# Patient Record
Sex: Male | Born: 1955
Health system: Southern US, Community
[De-identification: ages and names within clinical notes are randomized; demographics above are authoritative.]

## PROBLEM LIST (undated history)

## (undated) DIAGNOSIS — T7840XA Allergy, unspecified, initial encounter: Secondary | ICD-10-CM

## (undated) DIAGNOSIS — F17200 Nicotine dependence, unspecified, uncomplicated: Secondary | ICD-10-CM

## (undated) DIAGNOSIS — E782 Mixed hyperlipidemia: Secondary | ICD-10-CM

## (undated) DIAGNOSIS — E119 Type 2 diabetes mellitus without complications: Secondary | ICD-10-CM

## (undated) DIAGNOSIS — N529 Male erectile dysfunction, unspecified: Secondary | ICD-10-CM

## (undated) DIAGNOSIS — I10 Essential (primary) hypertension: Secondary | ICD-10-CM

## (undated) DIAGNOSIS — K5732 Diverticulitis of large intestine without perforation or abscess without bleeding: Secondary | ICD-10-CM

## (undated) HISTORY — DX: Essential (primary) hypertension: I10

## (undated) HISTORY — DX: Diverticulitis of large intestine without perforation or abscess without bleeding: K57.32

## (undated) HISTORY — PX: COLONOSCOPY: SHX174

## (undated) HISTORY — DX: Allergy, unspecified, initial encounter: T78.40XA

## (undated) HISTORY — DX: Male erectile dysfunction, unspecified: N52.9

## (undated) HISTORY — DX: Mixed hyperlipidemia: E78.2

## (undated) HISTORY — DX: Nicotine dependence, unspecified, uncomplicated: F17.200

## (undated) HISTORY — DX: Type 2 diabetes mellitus without complications: E11.9

---

## 2006-02-26 ENCOUNTER — Ambulatory Visit: Payer: Self-pay | Admitting: Family Medicine

## 2006-02-26 ENCOUNTER — Ambulatory Visit: Payer: Self-pay | Admitting: Cardiology

## 2006-02-28 ENCOUNTER — Ambulatory Visit: Payer: Self-pay | Admitting: Family Medicine

## 2006-04-20 ENCOUNTER — Ambulatory Visit: Payer: Self-pay | Admitting: Family Medicine

## 2006-05-18 ENCOUNTER — Ambulatory Visit: Payer: Self-pay | Admitting: Family Medicine

## 2006-05-25 ENCOUNTER — Ambulatory Visit: Payer: Self-pay | Admitting: Family Medicine

## 2006-05-25 ENCOUNTER — Encounter: Admission: RE | Admit: 2006-05-25 | Discharge: 2006-05-25 | Payer: Self-pay | Admitting: Family Medicine

## 2006-06-01 ENCOUNTER — Ambulatory Visit: Payer: Self-pay | Admitting: Family Medicine

## 2006-07-02 ENCOUNTER — Ambulatory Visit: Payer: Self-pay | Admitting: Family Medicine

## 2006-09-03 ENCOUNTER — Ambulatory Visit: Payer: Self-pay | Admitting: Family Medicine

## 2006-09-03 LAB — CONVERTED CEMR LAB
Chol/HDL Ratio, serum: 7.3
HDL: 35 mg/dL — ABNORMAL LOW (ref >39.0)
Hgb A1c MFr Bld: 5.9 % (ref 4.6–6.0)
Triglyceride fasting, serum: 193 mg/dL — ABNORMAL HIGH (ref 0–149)
VLDL: 39 mg/dL (ref 0–40)

## 2006-09-05 ENCOUNTER — Ambulatory Visit: Payer: Self-pay | Admitting: Family Medicine

## 2006-09-10 ENCOUNTER — Ambulatory Visit: Payer: Self-pay | Admitting: Family Medicine

## 2006-09-10 LAB — CONVERTED CEMR LAB
Cholesterol: 265 mg/dL (ref 0–200)
HDL: 35.5 mg/dL — ABNORMAL LOW (ref >39.0)
Hgb A1c MFr Bld: 6 % (ref 4.6–6.0)
LDL DIRECT: 186.1 mg/dL
Triglyceride fasting, serum: 189 mg/dL — ABNORMAL HIGH (ref 0–149)
VLDL: 38 mg/dL (ref 0–40)

## 2006-11-21 ENCOUNTER — Ambulatory Visit: Payer: Self-pay | Admitting: Family Medicine

## 2006-11-21 LAB — CONVERTED CEMR LAB
ALT: 42 U/L — ABNORMAL HIGH
AST: 26 U/L
Albumin: 4.1 g/dL
Alkaline Phosphatase: 58 U/L
Bilirubin, Direct: 0.2 mg/dL
Cholesterol: 174 mg/dL
Direct LDL: 113.4 mg/dL
HDL: 33.5 mg/dL — ABNORMAL LOW
Total Bilirubin: 0.8 mg/dL
Total CHOL/HDL Ratio: 5.2
Total Protein: 6.8 g/dL
Triglycerides: 206 mg/dL
VLDL: 41 mg/dL — ABNORMAL HIGH

## 2007-03-06 IMAGING — CT CT ABDOMEN W/ CM
2 of 5 series · 17 of 46 positions shown, 19 images · IV contrast (APPLIED)
Comparison: None.

CLINICAL DATA: Lower abdominal pain with fever and chills.
 ABDOMEN CT WITH CONTRAST:
TECHNIQUE: Multidetector CT imaging of the abdomen was performed following the standard protocol during bolus administration of intravenous contrast.
 Contrast:  125 cc Omnipaque 300.
TECHNIQUE: Multidetector CT imaging of the pelvis was performed following the standard protocol during bolus administration of intravenous contrast.

[Series 2: abd_pel 5.0 b30f st · axial · 0.68mm/px · z∈[-482,-82]mm · 14 of 90 slices shown, 16 images]
[im 5/90  soft-tissue]
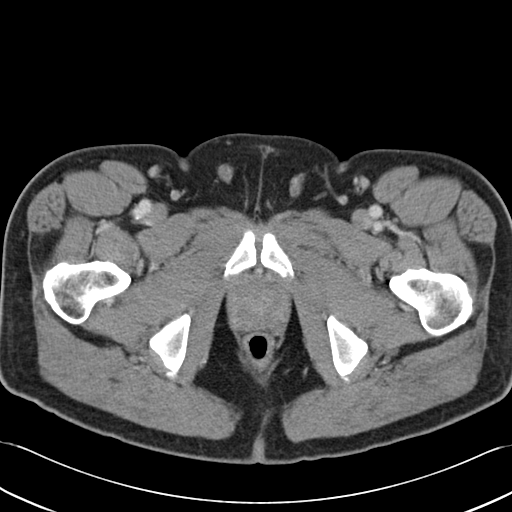
[im 5/90  bone]
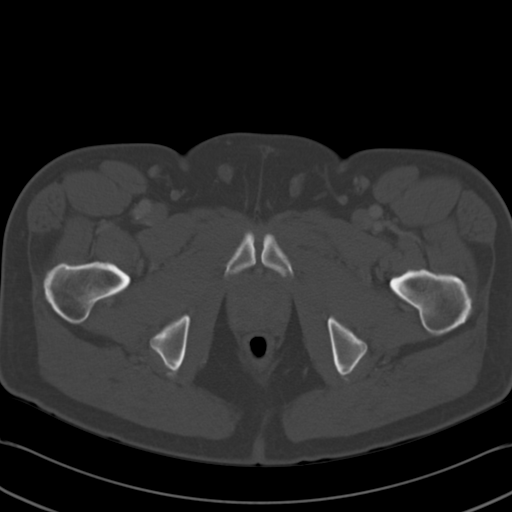
[im 14/90  soft-tissue]
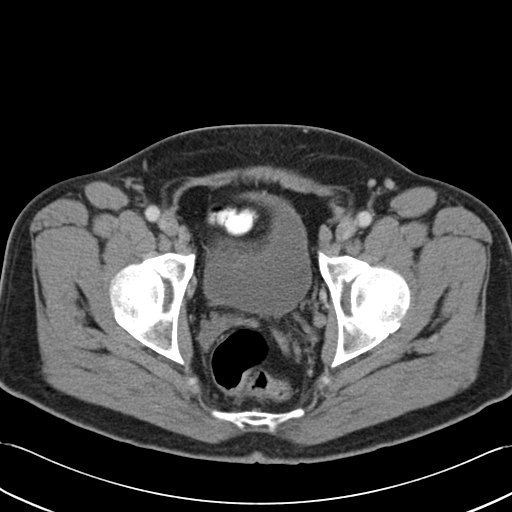
[im 18/90  soft-tissue]
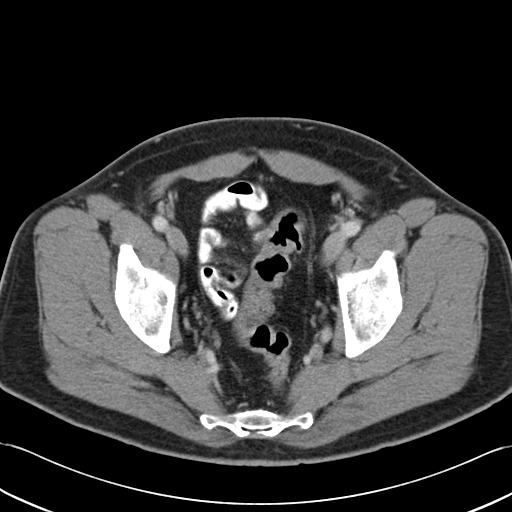
[im 23/90  soft-tissue]
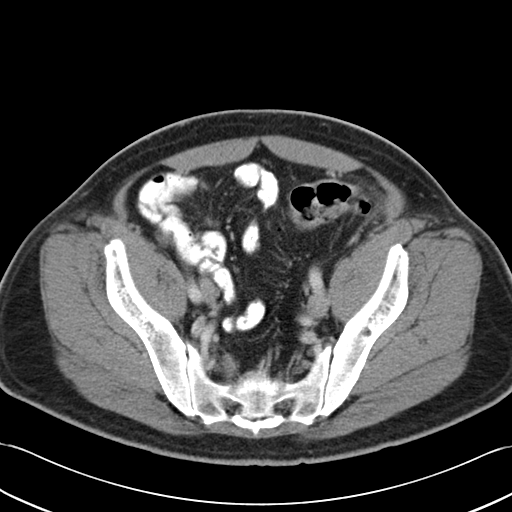
[im 32/90  soft-tissue]
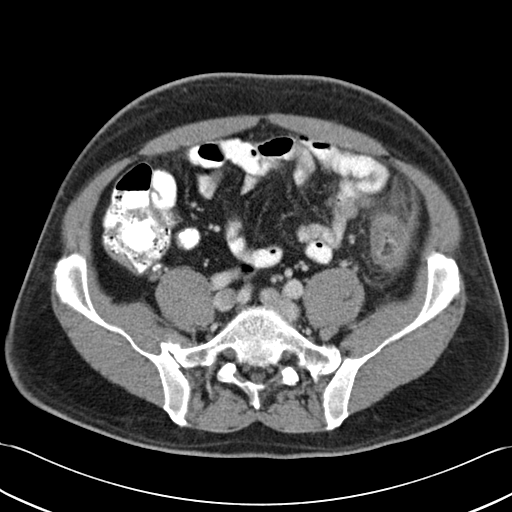
[im 36/90  soft-tissue]
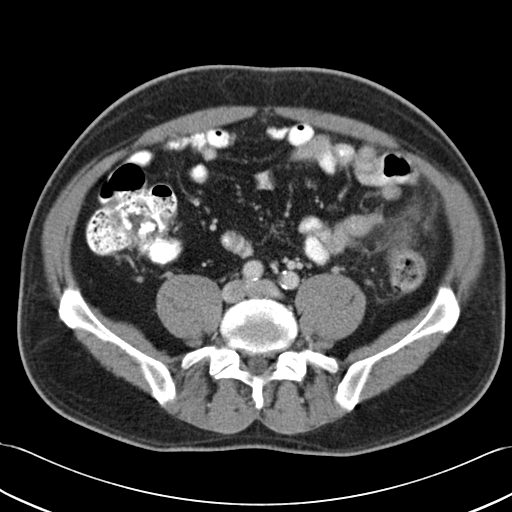
[im 41/90  soft-tissue]
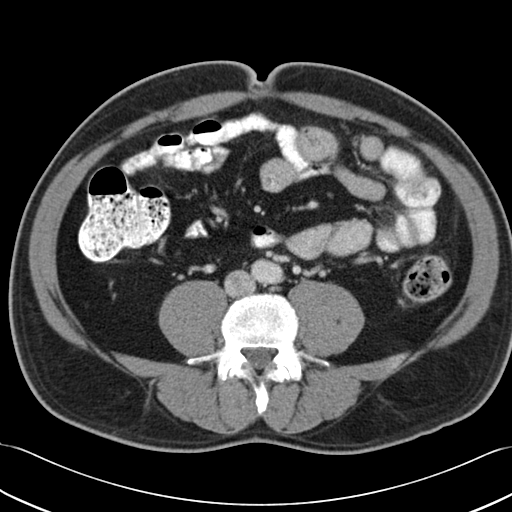
[im 49/90  soft-tissue]
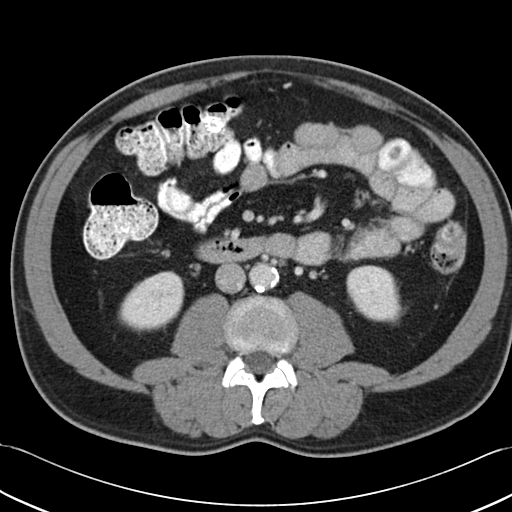
[im 54/90  soft-tissue]
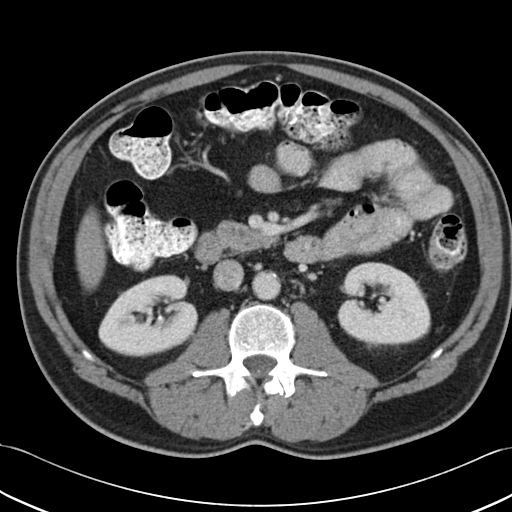
[im 54/90  bone]
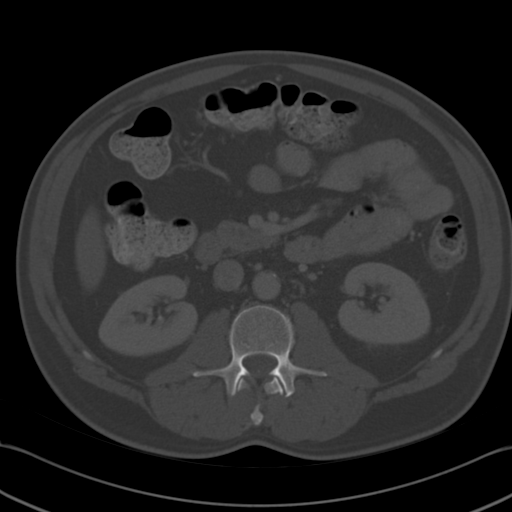
[im 58/90  soft-tissue]
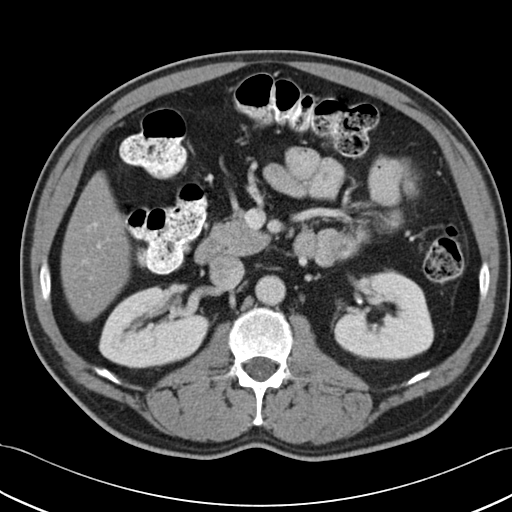
[im 67/90  soft-tissue]
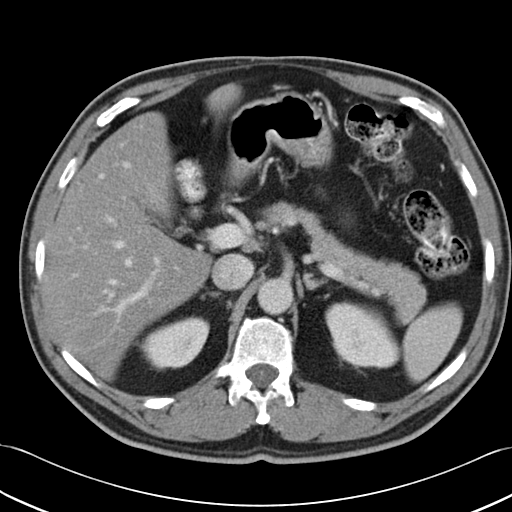
[im 72/90  soft-tissue]
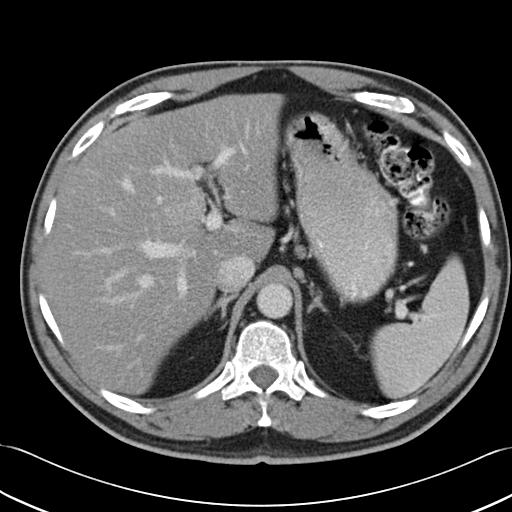
[im 76/90  soft-tissue]
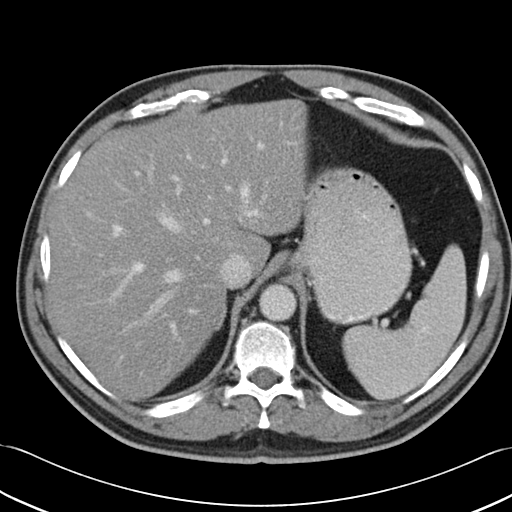
[im 85/90  soft-tissue]
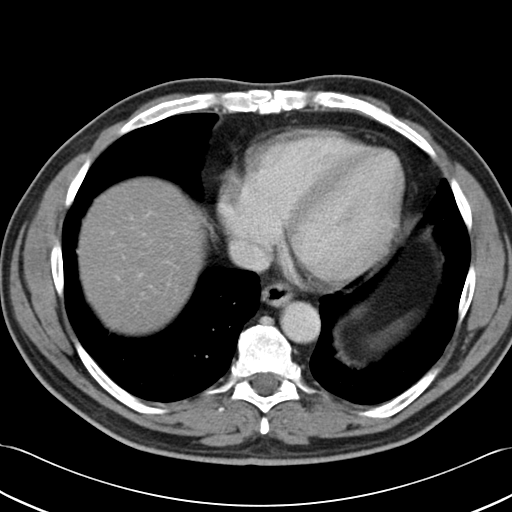

[Series 602: coronal abdomen · coronal · 0.90mm/px · 3 of 53 slices shown]
[im 18/53  soft-tissue]
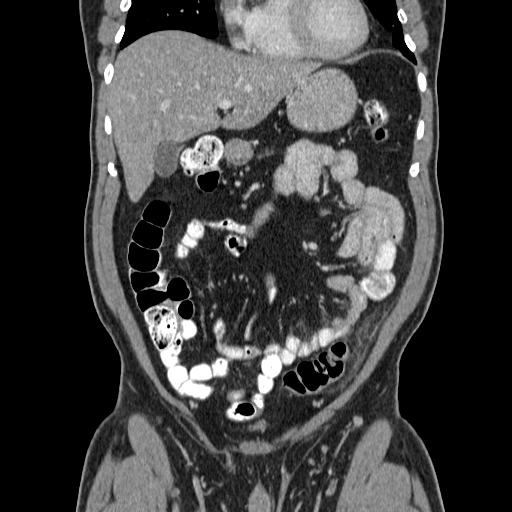
[im 24/53  soft-tissue]
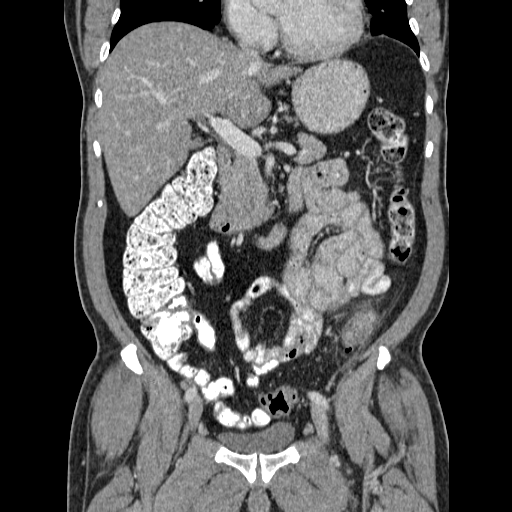
[im 29/53  soft-tissue]
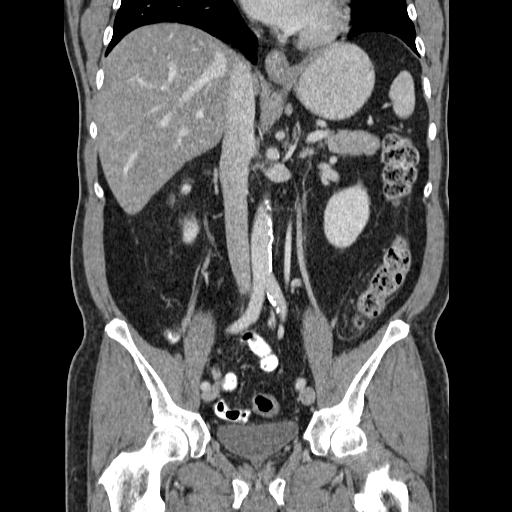

[17 of 46 positions shown; findings below may reference images not displayed]

FINDINGS: Lung bases are clear.  There is diffuse low attenuation of the liver consistent with fatty infiltration.  No focal defects.  Spleen, pancreas, and adrenals normal.  Early and delayed images of the kidneys normal.
 No calcified gallstones or bile duct dilatation.
IMPRESSION: Fatty infiltration of the liver ? no acute abnormality.
 PELVIS CT WITH CONTRAST:
FINDINGS: There is stranding around the descending colon with wall thickening.  There are several diverticular outpouchings and so the most likely diagnosis is diverticulitis.  No definite perforation, abscess, or free air.
IMPRESSION: Findings consistent with lower descending colon diverticulitis ? no abscess or perforation at this time.

## 2007-04-08 ENCOUNTER — Ambulatory Visit: Payer: Self-pay | Admitting: Family Medicine

## 2007-05-17 DIAGNOSIS — E1159 Type 2 diabetes mellitus with other circulatory complications: Secondary | ICD-10-CM | POA: Insufficient documentation

## 2007-05-17 DIAGNOSIS — I10 Essential (primary) hypertension: Secondary | ICD-10-CM | POA: Insufficient documentation

## 2007-05-17 DIAGNOSIS — E119 Type 2 diabetes mellitus without complications: Secondary | ICD-10-CM

## 2007-05-17 HISTORY — DX: Essential (primary) hypertension: I10

## 2007-05-17 HISTORY — DX: Type 2 diabetes mellitus without complications: E11.9

## 2007-05-20 ENCOUNTER — Ambulatory Visit: Payer: Self-pay | Admitting: Family Medicine

## 2007-05-20 LAB — CONVERTED CEMR LAB
ALT: 41 units/L (ref 0–53)
AST: 25 units/L (ref 0–37)
Alkaline Phosphatase: 47 units/L (ref 39–117)
Blood in Urine, dipstick: NEGATIVE
Calcium: 9.3 mg/dL (ref 8.4–10.5)
Cholesterol: 205 mg/dL (ref 0–200)
Eosinophils Absolute: 0.2 10*3/uL (ref 0.0–0.6)
GFR calc non Af Amer: 127 mL/min
Glucose, Urine, Semiquant: NEGATIVE
HDL: 32.5 mg/dL — ABNORMAL LOW (ref >39.0)
MCV: 92.8 fL (ref 78.0–100.0)
Monocytes Absolute: 0.7 10*3/uL (ref 0.2–0.7)
Monocytes Relative: 7.8 % (ref 3.0–11.0)
Neutrophils Relative %: 59.6 % (ref 43.0–77.0)
Nitrite: NEGATIVE
RBC: 4.9 M/uL (ref 4.22–5.81)
TSH: 2.91 microintl units/mL (ref 0.35–5.50)
Total Bilirubin: 0.9 mg/dL (ref 0.3–1.2)
Total CHOL/HDL Ratio: 6.3
Total Protein: 6.9 g/dL (ref 6.0–8.3)
Urobilinogen, UA: 0.2
WBC Urine, dipstick: NEGATIVE
WBC: 8.8 10*3/uL (ref 4.5–10.5)
pH: 5.5

## 2007-05-27 ENCOUNTER — Ambulatory Visit: Payer: Self-pay | Admitting: Family Medicine

## 2007-05-27 DIAGNOSIS — F17200 Nicotine dependence, unspecified, uncomplicated: Secondary | ICD-10-CM | POA: Insufficient documentation

## 2007-05-27 DIAGNOSIS — N529 Male erectile dysfunction, unspecified: Secondary | ICD-10-CM

## 2007-05-27 DIAGNOSIS — E1169 Type 2 diabetes mellitus with other specified complication: Secondary | ICD-10-CM | POA: Insufficient documentation

## 2007-05-27 DIAGNOSIS — E782 Mixed hyperlipidemia: Secondary | ICD-10-CM | POA: Insufficient documentation

## 2007-05-27 DIAGNOSIS — Z720 Tobacco use: Secondary | ICD-10-CM | POA: Insufficient documentation

## 2007-05-27 HISTORY — DX: Mixed hyperlipidemia: E78.2

## 2007-05-27 HISTORY — DX: Nicotine dependence, unspecified, uncomplicated: F17.200

## 2007-05-27 HISTORY — DX: Male erectile dysfunction, unspecified: N52.9

## 2007-06-02 IMAGING — CR DG CHEST 2V
2 series · 2 of 2 positions shown · non-contrast
Comparison: None.

CLINICAL DATA: Cough.  Smoking history.
 CHEST - 2 VIEW:

[view not recorded (1 of 2)]
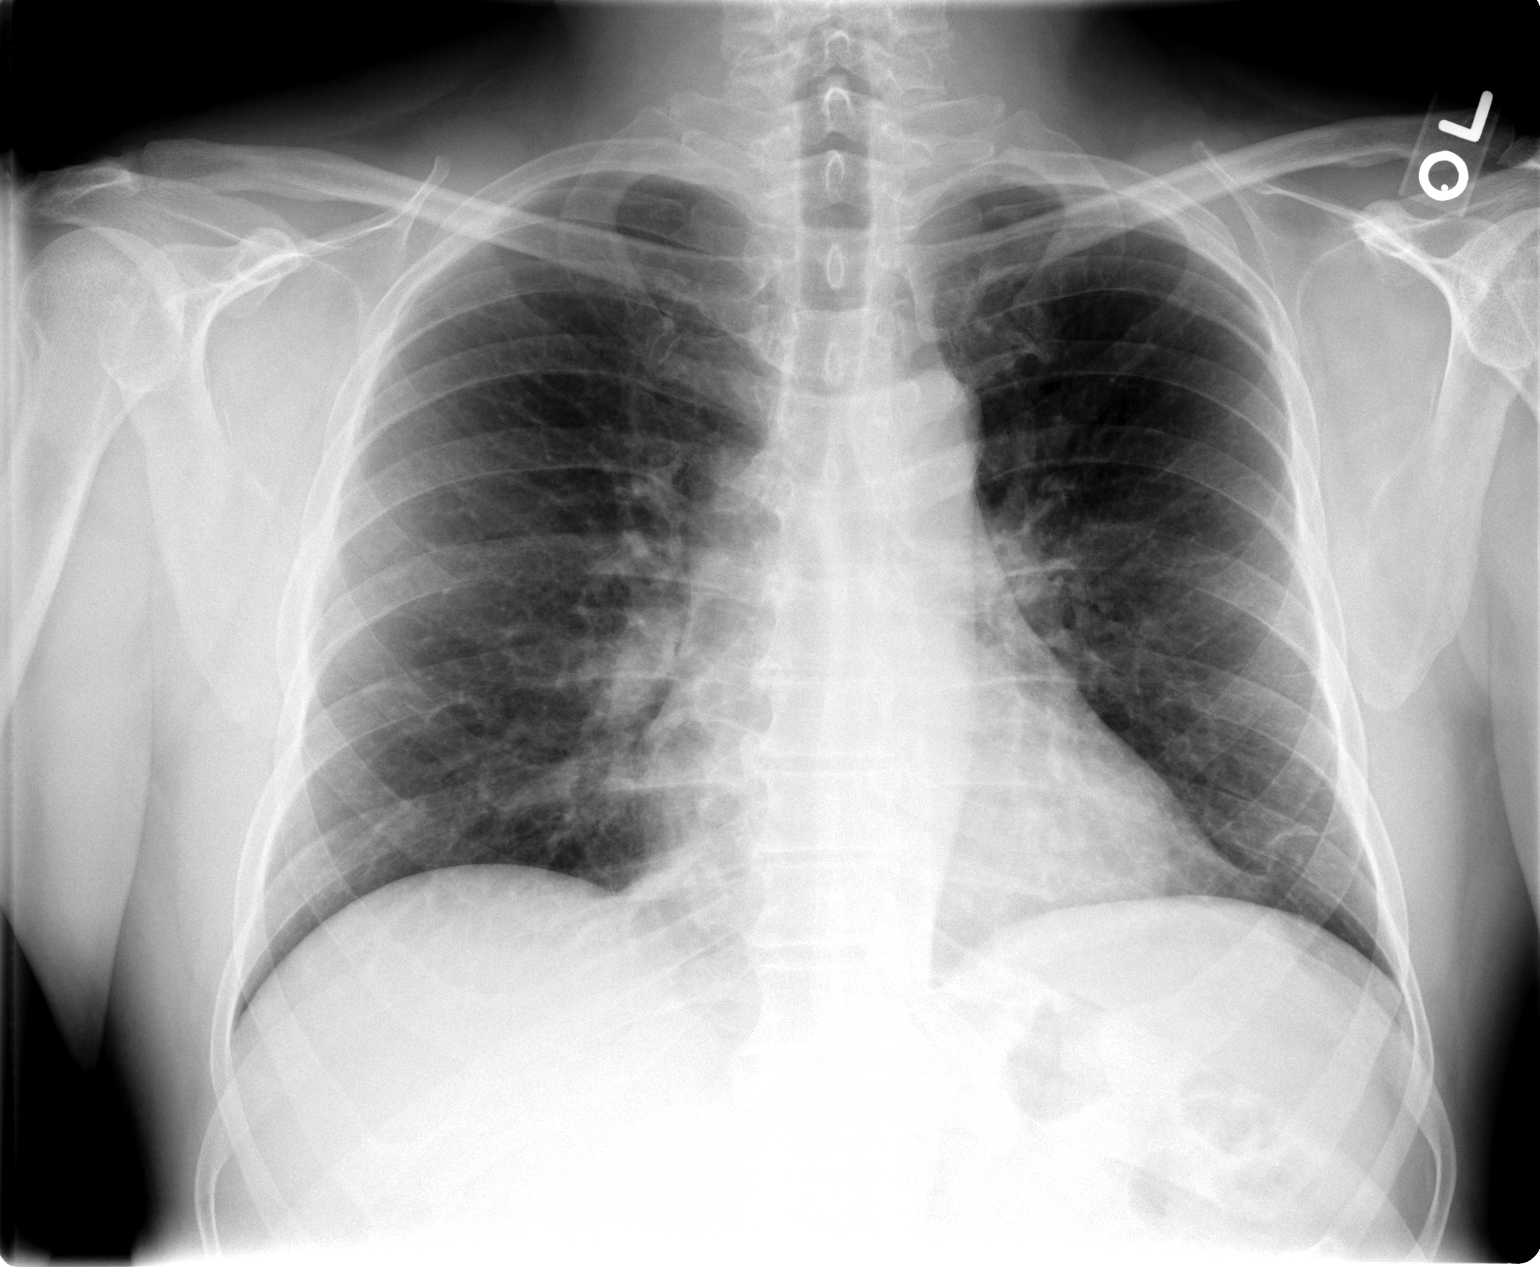

[view not recorded (2 of 2)]
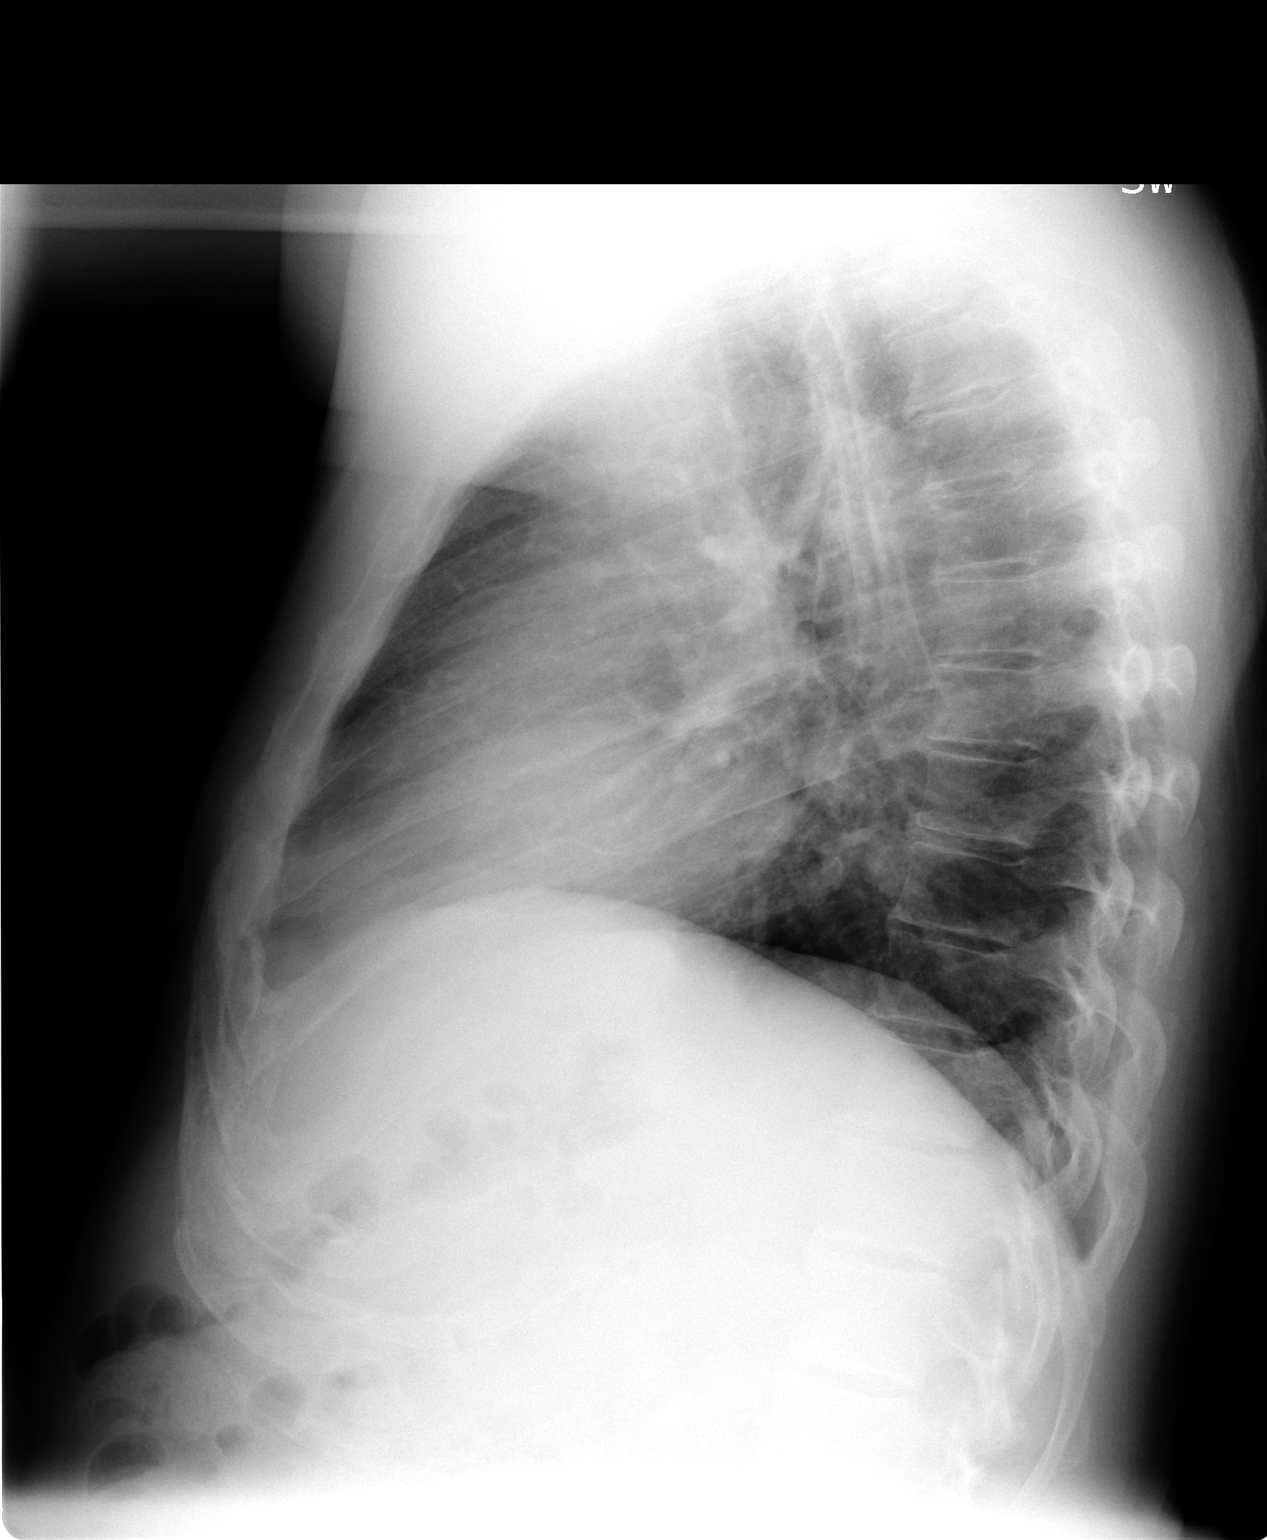

[2 of 2 positions shown; findings below may reference images not displayed]

FINDINGS: Peribronchial cuffing with perihilar interstitial densities, likely chronic.  Negative for pulmonary nodules or effusions.  The heart is normal in size.  Mediastinum and hila are negative for adenopathy.  Degenerative changes in the midthoracic spine noted.
IMPRESSION: Probable bronchial cuffing, acute versus chronic.  Negative for infiltrates, edema, or effusion.

## 2007-09-30 ENCOUNTER — Encounter: Payer: Self-pay | Admitting: Family Medicine

## 2007-11-27 ENCOUNTER — Ambulatory Visit: Payer: Self-pay | Admitting: Family Medicine

## 2007-11-27 DIAGNOSIS — E785 Hyperlipidemia, unspecified: Secondary | ICD-10-CM | POA: Insufficient documentation

## 2007-11-27 LAB — CONVERTED CEMR LAB
CO2: 30 meq/L (ref 19–32)
Chloride: 103 meq/L (ref 96–112)
Creatinine,U: 83.1 mg/dL
Direct LDL: 160.7 mg/dL
GFR calc non Af Amer: 95 mL/min
Glucose, Bld: 115 mg/dL — ABNORMAL HIGH (ref 70–99)
HDL: 36.1 mg/dL — ABNORMAL LOW (ref >39.0)
Hgb A1c MFr Bld: 6.5 % — ABNORMAL HIGH (ref 4.6–6.0)
Sodium: 143 meq/L (ref 135–145)
Triglycerides: 247 mg/dL (ref 0–149)
VLDL: 49 mg/dL — ABNORMAL HIGH (ref 0–40)

## 2007-11-28 ENCOUNTER — Telehealth: Payer: Self-pay | Admitting: Family Medicine

## 2008-06-01 ENCOUNTER — Telehealth: Payer: Self-pay | Admitting: *Deleted

## 2008-06-03 ENCOUNTER — Telehealth: Payer: Self-pay | Admitting: *Deleted

## 2008-06-08 ENCOUNTER — Telehealth: Payer: Self-pay | Admitting: Family Medicine

## 2008-07-06 ENCOUNTER — Telehealth: Payer: Self-pay | Admitting: Family Medicine

## 2008-07-16 ENCOUNTER — Ambulatory Visit: Payer: Self-pay | Admitting: Family Medicine

## 2008-07-16 LAB — CONVERTED CEMR LAB
Alkaline Phosphatase: 53 units/L (ref 39–117)
Basophils Absolute: 0 10*3/uL (ref 0.0–0.1)
Bilirubin, Direct: 0.2 mg/dL (ref 0.0–0.3)
Blood in Urine, dipstick: NEGATIVE
Eosinophils Absolute: 0.2 10*3/uL (ref 0.0–0.7)
GFR calc Af Amer: 115 mL/min
GFR calc non Af Amer: 95 mL/min
Glucose, Bld: 99 mg/dL (ref 70–99)
HCT: 45.4 % (ref 39.0–52.0)
HDL: 38 mg/dL — ABNORMAL LOW (ref >39.0)
Hgb A1c MFr Bld: 6.2 % — ABNORMAL HIGH (ref 4.6–6.0)
Ketones, urine, test strip: NEGATIVE
MCHC: 35.4 g/dL (ref 30.0–36.0)
Microalb Creat Ratio: 2.8 mg/g (ref 0.0–30.0)
Microalb, Ur: 0.5 mg/dL (ref 0.0–1.9)
Monocytes Absolute: 0.7 10*3/uL (ref 0.1–1.0)
Monocytes Relative: 8.4 % (ref 3.0–12.0)
Nitrite: NEGATIVE
Platelets: 224 10*3/uL (ref 150–400)
Potassium: 3.9 meq/L (ref 3.5–5.1)
RDW: 12 % (ref 11.5–14.6)
Sodium: 144 meq/L (ref 135–145)
Specific Gravity, Urine: 1.02
TSH: 1.68 microintl units/mL (ref 0.35–5.50)
Total Bilirubin: 1.2 mg/dL (ref 0.3–1.2)
Total CHOL/HDL Ratio: 4.9
Triglycerides: 105 mg/dL (ref 0–149)
Urobilinogen, UA: 0.2

## 2008-07-23 ENCOUNTER — Ambulatory Visit: Payer: Self-pay | Admitting: Family Medicine

## 2009-01-14 ENCOUNTER — Ambulatory Visit: Payer: Self-pay | Admitting: Family Medicine

## 2009-01-14 LAB — CONVERTED CEMR LAB
Calcium: 9.4 mg/dL (ref 8.4–10.5)
Creatinine, Ser: 0.8 mg/dL (ref 0.4–1.5)
GFR calc non Af Amer: 108.16 mL/min (ref >60)
Sodium: 140 meq/L (ref 135–145)

## 2009-01-21 ENCOUNTER — Ambulatory Visit: Payer: Self-pay | Admitting: Family Medicine

## 2009-07-23 ENCOUNTER — Ambulatory Visit: Payer: Self-pay | Admitting: Family Medicine

## 2009-07-23 LAB — CONVERTED CEMR LAB
ALT: 32 units/L (ref 0–53)
AST: 27 units/L (ref 0–37)
Albumin: 4.5 g/dL (ref 3.5–5.2)
BUN: 18 mg/dL (ref 6–23)
Basophils Relative: 0.3 % (ref 0.0–3.0)
Bilirubin Urine: NEGATIVE
Chloride: 100 meq/L (ref 96–112)
Cholesterol: 178 mg/dL (ref 0–200)
Eosinophils Relative: 2.3 % (ref 0.0–5.0)
GFR calc non Af Amer: 107.94 mL/min (ref >60)
Glucose, Urine, Semiquant: NEGATIVE
HCT: 45.4 % (ref 39.0–52.0)
Hemoglobin: 15.6 g/dL (ref 13.0–17.0)
LDL Cholesterol: 122 mg/dL — ABNORMAL HIGH (ref 0–99)
Lymphs Abs: 1.7 10*3/uL (ref 0.7–4.0)
MCV: 93.2 fL (ref 78.0–100.0)
Microalb Creat Ratio: 1.7 mg/g (ref 0.0–30.0)
Monocytes Absolute: 0.6 10*3/uL (ref 0.1–1.0)
Monocytes Relative: 8 % (ref 3.0–12.0)
Neutro Abs: 4.7 10*3/uL (ref 1.4–7.7)
PSA: 0.54 ng/mL (ref 0.10–4.00)
Platelets: 218 10*3/uL (ref 150.0–400.0)
Potassium: 4.3 meq/L (ref 3.5–5.1)
Sodium: 137 meq/L (ref 135–145)
TSH: 1.65 microintl units/mL (ref 0.35–5.50)
Total Protein: 6.9 g/dL (ref 6.0–8.3)
Urobilinogen, UA: 0.2
VLDL: 21.4 mg/dL (ref 0.0–40.0)
WBC Urine, dipstick: NEGATIVE
WBC: 7.2 10*3/uL (ref 4.5–10.5)
pH: 7

## 2009-07-30 ENCOUNTER — Ambulatory Visit: Payer: Self-pay | Admitting: Family Medicine

## 2009-08-16 LAB — HM DIABETES EYE EXAM: HM Diabetic Eye Exam: NORMAL

## 2009-10-20 ENCOUNTER — Ambulatory Visit: Payer: Self-pay | Admitting: Family Medicine

## 2010-01-24 ENCOUNTER — Ambulatory Visit: Payer: Self-pay | Admitting: Family Medicine

## 2010-01-24 LAB — CONVERTED CEMR LAB
BUN: 18 mg/dL (ref 6–23)
CO2: 32 meq/L (ref 19–32)
Chloride: 104 meq/L (ref 96–112)
Creatinine, Ser: 0.7 mg/dL (ref 0.4–1.5)
Glucose, Bld: 130 mg/dL — ABNORMAL HIGH (ref 70–99)
Hgb A1c MFr Bld: 6.3 % (ref 4.6–6.5)
Potassium: 5 meq/L (ref 3.5–5.1)

## 2010-01-31 ENCOUNTER — Ambulatory Visit: Payer: Self-pay | Admitting: Family Medicine

## 2010-07-26 ENCOUNTER — Ambulatory Visit: Payer: Self-pay | Admitting: Family Medicine

## 2010-07-26 LAB — CONVERTED CEMR LAB
Alkaline Phosphatase: 51 units/L (ref 39–117)
Basophils Absolute: 0 10*3/uL (ref 0.0–0.1)
Bilirubin, Direct: 0.1 mg/dL (ref 0.0–0.3)
Blood in Urine, dipstick: NEGATIVE
CO2: 31 meq/L (ref 19–32)
Calcium: 9.2 mg/dL (ref 8.4–10.5)
Creatinine, Ser: 0.7 mg/dL (ref 0.4–1.5)
Creatinine,U: 111.8 mg/dL
Eosinophils Absolute: 0.2 10*3/uL (ref 0.0–0.7)
GFR calc non Af Amer: 134.25 mL/min (ref >60)
Glucose, Bld: 120 mg/dL — ABNORMAL HIGH (ref 70–99)
HDL: 36.8 mg/dL — ABNORMAL LOW (ref >39.00)
Hgb A1c MFr Bld: 6.9 % — ABNORMAL HIGH (ref 4.6–6.5)
Ketones, urine, test strip: NEGATIVE
Lymphocytes Relative: 22.2 % (ref 12.0–46.0)
MCHC: 34.6 g/dL (ref 30.0–36.0)
Microalb Creat Ratio: 0.9 mg/g (ref 0.0–30.0)
Microalb, Ur: 1 mg/dL (ref 0.0–1.9)
Monocytes Relative: 8.2 % (ref 3.0–12.0)
Neutrophils Relative %: 67.3 % (ref 43.0–77.0)
Nitrite: NEGATIVE
Protein, U semiquant: NEGATIVE
RDW: 13.2 % (ref 11.5–14.6)
Specific Gravity, Urine: 1.02
Total Bilirubin: 0.7 mg/dL (ref 0.3–1.2)
Total CHOL/HDL Ratio: 5
Triglycerides: 135 mg/dL (ref 0.0–149.0)
VLDL: 27 mg/dL (ref 0.0–40.0)
pH: 7

## 2010-08-02 ENCOUNTER — Encounter: Payer: Self-pay | Admitting: Family Medicine

## 2010-08-02 ENCOUNTER — Ambulatory Visit: Payer: Self-pay | Admitting: Family Medicine

## 2010-08-17 ENCOUNTER — Ambulatory Visit: Payer: Self-pay | Admitting: Family Medicine

## 2010-08-17 ENCOUNTER — Encounter (INDEPENDENT_AMBULATORY_CARE_PROVIDER_SITE_OTHER): Payer: Self-pay | Admitting: *Deleted

## 2010-08-17 DIAGNOSIS — K5732 Diverticulitis of large intestine without perforation or abscess without bleeding: Secondary | ICD-10-CM | POA: Insufficient documentation

## 2010-08-17 HISTORY — DX: Diverticulitis of large intestine without perforation or abscess without bleeding: K57.32

## 2010-08-19 ENCOUNTER — Ambulatory Visit: Payer: Self-pay | Admitting: Family Medicine

## 2010-09-07 ENCOUNTER — Encounter (INDEPENDENT_AMBULATORY_CARE_PROVIDER_SITE_OTHER): Payer: Self-pay | Admitting: *Deleted

## 2010-09-12 ENCOUNTER — Encounter (INDEPENDENT_AMBULATORY_CARE_PROVIDER_SITE_OTHER): Payer: Self-pay | Admitting: *Deleted

## 2010-09-12 ENCOUNTER — Ambulatory Visit: Payer: Self-pay | Admitting: Gastroenterology

## 2010-09-26 ENCOUNTER — Ambulatory Visit: Payer: Self-pay | Admitting: Gastroenterology

## 2010-09-26 LAB — HM COLONOSCOPY

## 2010-09-27 ENCOUNTER — Encounter: Payer: Self-pay | Admitting: Gastroenterology

## 2010-11-15 NOTE — Assessment & Plan Note (Signed)
Summary: 6 month rov/njr   Vital Signs:  Patient profile:   55 year old male Weight:      183 pounds Temp:     97.7 degrees F oral BP sitting:   120 / 80  (left arm) Cuff size:   regular  Vitals Entered By: Kern Reap CMA Duncan Dull) (January 31, 2010 8:50 AM)  CC: follow-up visit   CC:  follow-up visit.  History of Present Illness: Tyler Wolf is a 55 year old, married male, nonsmoker, diabetic, who comes in for follow-up of diabetes.  His fasting blood sugar at home average is 110.  Blood sugar.  We did here the other day was 130 however hemoglobin A1c was 6.3% asymptomatic.  No hypoglycemia.  Allergies: No Known Drug Allergies  Review of Systems      See HPI  Physical Exam  General:  Well-developed,well-nourished,in no acute distress; alert,appropriate and cooperative throughout examination  Diabetes Management Exam:    Eye Exam:       Eye Exam done elsewhere          Date: 08/16/2009          Results: normal          Done by: Ginette Otto   Impression & Recommendations:  Problem # 1:  DIABETES MELLITUS, TYPE II (ICD-250.00) Assessment Improved  His updated medication list for this problem includes:    Lisinopril 5 Mg Tabs (Lisinopril) .Marland Kitchen... 1 tab once daily    Metformin Hcl 1000 Mg Tabs (Metformin hcl) .Marland Kitchen... 1/2 tab by mouth two times a day    Aspirin Adult Low Strength 81 Mg Tbec (Aspirin) ..... Once daily  Complete Medication List: 1)  Lisinopril 5 Mg Tabs (Lisinopril) .Marland Kitchen.. 1 tab once daily 2)  Atenolol-chlorthalidone 50-25 Mg Tabs (Atenolol-chlorthalidone) .Marland Kitchen.. 1 tab once daily 3)  Metformin Hcl 1000 Mg Tabs (Metformin hcl) .... 1/2 tab by mouth two times a day 4)  Viagra 100 Mg Tabs (Sildenafil citrate) .... Uad 5)  Aspirin Adult Low Strength 81 Mg Tbec (Aspirin) .... Once daily 6)  Zocor 80 Mg Tabs (Simvastatin) .Marland Kitchen.. 1 tab @ bedtime  Patient Instructions: 1)  return the third week in October for annual exam 2)  BMP prior to visit, ICD-9:.........250.00 3)   Hepatic Panel prior to visit, ICD-9: 4)  Lipid Panel prior to visit, ICD-9: 5)  TSH prior to visit, ICD-9: 6)  CBC w/ Diff prior to visit, ICD-9: 7)  Urine-dip prior to visit, ICD-9: 8)  PSA prior to visit, ICD-9: 9)  HbgA1C prior to visit, ICD-9: 10)  Urine Microalbumin prior to visit, ICD-9:

## 2010-11-15 NOTE — Letter (Signed)
Summary: Pre Visit Letter Revised  [facility]  [address] [place], [place] [address]   Phone: [phone]  Fax: [phone]        08/17/2010 MRN: [id number] Tyler Wolf [address] [place], [place]  [address]             Procedure Date:  09-26-10   Welcome to the Gastroenterology Division at [facility].    You are scheduled to see a nurse for your pre-procedure visit on 09-12-10 at 1:30p.m. on the 3rd floor at [facility], [address]  We ask that you try to arrive at our office 15 minutes prior to your appointment time to allow for check-in.  Please take a minute to review the attached form.  If you answer "Yes" to one or more of the questions on the first page, we ask that you call the person listed at your earliest opportunity.  If you answer "No" to all of the questions, please complete the rest of the form and bring it to your appointment.    Your nurse visit will consist of discussing your medical and surgical history, your immediate family medical history, and your medications.   If you are unable to list all of your medications on the form, please bring the medication bottles to your appointment and we will list them.  We will need to be aware of both prescribed and over the counter drugs.  We will need to know exact dosage information as well.    Please be prepared to read and sign documents such as consent forms, a financial agreement, and acknowledgement forms.  If necessary, and with your consent, a friend or relative is welcome to sit-in on the nurse visit with you.  Please bring your insurance card so that we may make a copy of it.  If your insurance requires a referral to see a specialist, please bring your referral form from your primary care physician.  No co-pay is required for this nurse visit.     If you cannot keep your appointment, please call [phone] to cancel or reschedule prior to your appointment date.  This allows  [place] the opportunity to schedule an appointment for another patient in need of care.    Thank you for choosing [facility] for your medical needs.  We appreciate the opportunity to care for you.  Please visit [place] at our website  to learn more about our practice.  Sincerely, The Gastroenterology Division

## 2010-11-15 NOTE — Assessment & Plan Note (Signed)
Summary: 2 day rov/njr   Vital Signs:  Patient profile:   55 year old male Weight:      177 pounds Temp:     98.4 degrees F oral BP sitting:   120 / 84  (left arm) Cuff size:   regular  Vitals Entered By: Kern Reap CMA Duncan Dull) (August 19, 2010 9:25 AM) CC: follow-up visit   CC:  follow-up visit.  History of Present Illness: Acea is a 55 year old, married male, smoker, who comes in today for follow-up of diverticulitis.  We saw him two days ago with diverticulitis.  We placed him on clear liquids, rest at home, Cipro and Flagyl b.i.d., and stool softeners.  He comes in today saying his pain is much diminished although not completely gone.  He sent her for a colonoscopy in December, the 12th  Allergies: No Known Drug Allergies  Past History:  Past medical, surgical, family and social histories (including risk factors) reviewed for relevance to current acute and chronic problems.  Past Medical History: Reviewed history from 11/27/2007 and no changes required. Diabetes mellitus, type II Hypertension ED Hyperlipidemia  Family History: Reviewed history from 05/17/2007 and no changes required. Family History Diabetes 1st degree relative  Social History: Reviewed history from 07/23/2008 and no changes required. Married Current Smoker Alcohol use-yes Drug use-no Regular exercise-no  Review of Systems      See HPI  Physical Exam  General:  Well-developed,well-nourished,in no acute distress; alert,appropriate and cooperative throughout examination Abdomen:  the abdomen is soft.  The bowel sounds are normal.  There is some tenderness left lower quadrant.  No rebound.  No palpable masses   Impression & Recommendations:  Problem # 1:  DIVERTICULITIS, ACUTE (ICD-562.11) Assessment Improved  Complete Medication List: 1)  Lisinopril 5 Mg Tabs (Lisinopril) .Marland Kitchen.. 1 tab once daily 2)  Atenolol-chlorthalidone 50-25 Mg Tabs (Atenolol-chlorthalidone) .Marland Kitchen.. 1 tab once  daily 3)  Metformin Hcl 1000 Mg Tabs (Metformin hcl) .... 1/2 tab by mouth two times a day 4)  Viagra 100 Mg Tabs (Sildenafil citrate) .... Uad 5)  Aspirin Adult Low Strength 81 Mg Tbec (Aspirin) .... Once daily 6)  Lipitor 40 Mg Tabs (Atorvastatin calcium) .Marland Kitchen.. 1 tab @ bedtime 7)  Cipro 500 Mg Tabs (Ciprofloxacin hcl) .... Take 1 tablet by mouth two times a day 8)  Metronidazole 500 Mg Tabs (Metronidazole) .... Take 1 tablet by mouth two times a day  Patient Instructions: 1)  advance your diet, to a soft diet. 2)  Continue to drink lots of liquids and take the Cipro and Flagyl one of each twice daily to the bottles are empty. 3)  Return p.r.n.Marland Kitchen 4)  One sure pain free, then resume a regular diet, but avoid nuts and seeds   Orders Added: 1)  Est. Patient Level III [84696]

## 2010-11-15 NOTE — Assessment & Plan Note (Signed)
Summary: LOWER ABD PAIN // RS   Vital Signs:  Patient profile:   55 year old male Weight:      180 pounds Temp:     98.4 degrees F oral BP sitting:   120 / 84  (left arm) Cuff size:   regular  Vitals Entered By: Kern Reap CMA Duncan Dull) (August 17, 2010 9:27 AM) CC: lower abdominal pain   CC:  lower abdominal pain.  History of Present Illness: Tyler Wolf is a 55 year old male, who comes in with a 4-day history of left lower quadrant abdominal pain.  He states around 11 a.m. on Sunday he developed the gradual onset of left lower quadrant abdominal pain.  Since that time.  The pain is been fairly had some fever, chills, on Monday, but no vomiting, or diarrhea.  He said no urinary tract symptoms.  He said a history of diverticulitis in the past.  It had no change in bowel habits or rectal bleeding.  In the past.  We discussed colonoscopy, however, he is declined.  I asked him once we get this cleared up,  get a colonoscopy!!!!!!!!!!!!  Allergies (verified): No Known Drug Allergies  Past History:  Past medical, surgical, family and social histories (including risk factors) reviewed for relevance to current acute and chronic problems.  Past Medical History: Reviewed history from 11/27/2007 and no changes required. Diabetes mellitus, type II Hypertension ED Hyperlipidemia  Family History: Reviewed history from 05/17/2007 and no changes required. Family History Diabetes 1st degree relative  Social History: Reviewed history from 07/23/2008 and no changes required. Married Current Smoker Alcohol use-yes Drug use-no Regular exercise-no  Review of Systems      See HPI  Physical Exam  General:  Well-developed,well-nourished,in no acute distress; alert,appropriate and cooperative throughout examination Abdomen:  the abdomen is soft.  The bowel sounds are normal.  No organomegaly.  There is tenderness left lower quadrant.  No rebound.  No masses   Problems:  Medical Problems  Added: 1)  Dx of Diverticulitis, Acute  (ICD-562.11)  Impression & Recommendations:  Problem # 1:  DIVERTICULITIS, ACUTE (ICD-562.11) Assessment New  Complete Medication List: 1)  Lisinopril 5 Mg Tabs (Lisinopril) .Marland Kitchen.. 1 tab once daily 2)  Atenolol-chlorthalidone 50-25 Mg Tabs (Atenolol-chlorthalidone) .Marland Kitchen.. 1 tab once daily 3)  Metformin Hcl 1000 Mg Tabs (Metformin hcl) .... 1/2 tab by mouth two times a day 4)  Viagra 100 Mg Tabs (Sildenafil citrate) .... Uad 5)  Aspirin Adult Low Strength 81 Mg Tbec (Aspirin) .... Once daily 6)  Lipitor 40 Mg Tabs (Atorvastatin calcium) .Marland Kitchen.. 1 tab @ bedtime 7)  Cipro 500 Mg Tabs (Ciprofloxacin hcl) .... Take 1 tablet by mouth two times a day 8)  Metronidazole 500 Mg Tabs (Metronidazole) .... Take 1 tablet by mouth two times a day  Patient Instructions: 1)  stay on a clear liquid diet. 2)  Milk of Magnesia 3 tablespoons twice daily. 3)  Begin Cipro and Flagyl one of each twice daily. 4)  Return on Friday afternoon for follow-up. 5)  It is always possible that one of these diverticuli could perforate.  If that happens he will develop severe pain, fever, etc.  If that does occur go directly to the hospital Prescriptions: METRONIDAZOLE 500 MG TABS (METRONIDAZOLE) Take 1 tablet by mouth two times a day  #30 x 1   Entered and Authorized by:   Roderick Pee MD   Signed by:   Roderick Pee MD on 08/17/2010   Method used:  Electronically to        [facility]  [address] [phone]* (retail)       [address]       [place], [place]  [id number]       Ph: [phone] or [phone]       Fax: [phone]   RxID:   [id number] CIPRO 500 MG TABS (CIPROFLOXACIN HCL) Take 1 tablet by mouth two times a day  #30 x 1   Entered and Authorized by:   Roderick Pee MD   Signed by:   Roderick Pee MD on 08/17/2010   Method used:   [facility] to        [facility] [address] [phone]* (retail)       [address]        [place], [place]  [id number]       Ph: [phone] or [phone]       Fax: [phone]   RxID:   [id number]    Orders Added: 1)  Est. Patient Level IV [16073]

## 2010-11-15 NOTE — Assessment & Plan Note (Signed)
Summary: CPX//CCM   Vital Signs:  Patient profile:   55 year old male Height:      67 inches Weight:      182 pounds BMI:     28.61 Temp:     98.4 degrees F oral BP sitting:   122 / 82  (left arm) Cuff size:   regular  Vitals Entered By: Kern Reap CMA Duncan Dull) (August 02, 2010 8:25 AM) CC: cpx Is Patient Diabetic? No Pain Assessment Patient in pain? yes     Location: shoulder Intensity: 3 Type: sharp Onset of pain  Constant   CC:  cpx.  History of Present Illness: Tyler Wolf is a 55 year old, married male, ex-smoker, who comes in today for physical evaluation because of a history of hypertension, diabetes, hyperlipidemia.  His hypertension is treated with lisinopril 5 mg daily, Tenoretic 5025 daily, BP 122/82.  His diabetes is treated with metformin 500 mg b.i.d. blood sugar in the 110, 120 range.  Hemoglobin A1c6 .7%.  He uses [facility].  He takes Zocor 80 mg nightly along with aspirin tablet was switched to another product than Zocor.  His last eye exam was in November 2010 optometrist.  It was normal.  Refer to Dr. Vonna Kotyk ophthalmologist.  He had a dental checkup, yesterday he's never had a colonoscopy.  Tetanus 2010 seasonal flu shot today.  He's also having some difficulty in his right shoulder for 4 months.  He is right-handed.  No history of trauma.  He works every day, lifting, heavy equipment  at [employer]  He is using a nicotine filter cartridge, however, he still continues to smoke 6 to 8 cigarettes per week.  Pacemaker progress.  Advised him to set up a program over the next 4 weeks to stop all nicotine  Allergies (verified): No Known Drug Allergies  Past History:  Past medical, surgical, family and social histories (including risk factors) reviewed, and no changes noted (except as noted below).  Past Medical History: Reviewed history from 11/27/2007 and no changes required. Diabetes mellitus, type II Hypertension ED Hyperlipidemia  Family  History: Reviewed history from 05/17/2007 and no changes required. Family History Diabetes 1st degree relative  Social History: Reviewed history from 07/23/2008 and no changes required. Married Current Smoker Alcohol use-yes Drug use-no Regular exercise-no  Review of Systems      See HPI       Flu Vaccine Consent Questions     Do you have a history of severe allergic reactions to this vaccine? no    Any prior history of allergic reactions to egg and/or gelatin? no    Do you have a sensitivity to the preservative Thimersol? no    Do you have a past history of Guillan-Barre Syndrome? no    Do you currently have an acute febrile illness? no    Have you ever had a severe reaction to latex? no    Vaccine information given and explained to patient? yes    Are you currently pregnant? no    Lot Number:[id number]   Exp Date:04/15/2011   Site Given  Left Deltoid IM   Physical Exam  General:  Well-developed,well-nourished,in no acute distress; alert,appropriate and cooperative throughout examination Head:  Normocephalic and atraumatic without obvious abnormalities. No apparent alopecia or balding. Eyes:  No corneal or conjunctival inflammation noted. EOMI. Perrla. Funduscopic exam benign, without hemorrhages, exudates or papilledema. Vision grossly normal. Ears:  External ear exam shows no significant lesions or deformities.  Otoscopic examination reveals clear canals, tympanic  membranes are intact bilaterally without bulging, retraction, inflammation or discharge. Hearing is grossly normal bilaterally. Nose:  External nasal examination shows no deformity or inflammation. Nasal mucosa are pink and moist without lesions or exudates. Mouth:  Oral mucosa and oropharynx without lesions or exudates.  Teeth in good repair. Neck:  No deformities, masses, or tenderness noted. Chest Wall:  No deformities, masses, tenderness or gynecomastia noted. Breasts:  No masses or gynecomastia noted Lungs:   Normal respiratory effort, chest expands symmetrically. Lungs are clear to auscultation, no crackles or wheezes. Heart:  Normal rate and regular rhythm. S1 and S2 normal without gallop, murmur, click, rub or other extra sounds. Abdomen:  Bowel sounds positive,abdomen soft and non-tender without masses, organomegaly or hernias noted. Rectal:  No external abnormalities noted. Normal sphincter tone. No rectal masses or tenderness. Genitalia:  Testes bilaterally descended without nodularity, tenderness or masses. No scrotal masses or lesions. No penis lesions or urethral discharge. Prostate:  Prostate gland firm and smooth, no enlargement, nodularity, tenderness, mass, asymmetry or induration. Msk:  No deformity or scoliosis noted of thoracic or lumbar spine.   Pulses:  R and L carotid,radial,femoral,dorsalis pedis and posterior tibial pulses are full and equal bilaterally Extremities:  No clubbing, cyanosis, edema, or deformity noted with normal full range of motion of all joints.   Neurologic:  No cranial nerve deficits noted. Station and gait are normal. Plantar reflexes are down-going bilaterally. DTRs are symmetrical throughout. Sensory, motor and coordinative functions appear intact. Skin:  Intact without suspicious lesions or rashes Cervical Nodes:  No lymphadenopathy noted Axillary Nodes:  No palpable lymphadenopathy Inguinal Nodes:  No significant adenopathy Psych:  Cognition and judgment appear intact. Alert and cooperative with normal attention span and concentration. No apparent delusions, illusions, hallucinations  Diabetes Management Exam:    Foot Exam (with socks and/or shoes not present):       Sensory-Pinprick/Light touch:          Left medial foot (L-4): normal          Left dorsal foot (L-5): normal          Left lateral foot (S-1): normal          Right medial foot (L-4): normal          Right dorsal foot (L-5): normal          Right lateral foot (S-1): normal        Sensory-Monofilament:          Left foot: normal          Right foot: normal       Inspection:          Left foot: normal          Right foot: normal       Nails:          Left foot: normal          Right foot: normal    Eye Exam:       Eye Exam done elsewhere          Date: 07/20/2009          Results: normal          Done by: opth   Impression & Recommendations:  Problem # 1:  HYPERLIPIDEMIA (ICD-272.4) Assessment Improved  The following medications were removed from the medication list:    Zocor 80 Mg Tabs (Simvastatin) .Marland Kitchen... 1 tab @ bedtime His updated medication list for this problem includes:  Lipitor 40 Mg Tabs (Atorvastatin calcium) .Marland Kitchen... 1 tab @ bedtime  Orders: Prescription Created Electronically [phone]) EKG w/ Interpretation (93000)  Problem # 2:  TOBACCO ABUSE (ICD-305.1) Assessment: Improved  Orders: Prescription Created Electronically [phone]) EKG w/ Interpretation (93000)  Problem # 3:  ERECTILE DYSFUNCTION, ORGANIC (ICD-607.84) Assessment: Improved  His updated medication list for this problem includes:    Viagra 100 Mg Tabs (Sildenafil citrate) ..... Uad  Orders: Prescription Created Electronically [phone])  Problem # 4:  HYPERTENSION (ICD-401.9) Assessment: Improved  His updated medication list for this problem includes:    Lisinopril 5 Mg Tabs (Lisinopril) .Marland Kitchen... 1 tab once daily    Atenolol-chlorthalidone 50-25 Mg Tabs (Atenolol-chlorthalidone) .Marland Kitchen... 1 tab once daily  Orders: Prescription Created Electronically [phone]) EKG w/ Interpretation (93000)  Problem # 5:  DIABETES MELLITUS, TYPE II (ICD-250.00) Assessment: Improved  His updated medication list for this problem includes:    Lisinopril 5 Mg Tabs (Lisinopril) .Marland Kitchen... 1 tab once daily    Metformin Hcl 1000 Mg Tabs (Metformin hcl) .Marland Kitchen... 1/2 tab by mouth two times a day    Aspirin Adult Low Strength 81 Mg Tbec (Aspirin) ..... Once daily  Orders: Prescription Created Electronically  [phone])  Problem # 6:  Preventive Health Care (ICD-V70.0) Assessment: Unchanged  Complete Medication List: 1)  Lisinopril 5 Mg Tabs (Lisinopril) .Marland Kitchen.. 1 tab once daily 2)  Atenolol-chlorthalidone 50-25 Mg Tabs (Atenolol-chlorthalidone) .Marland Kitchen.. 1 tab once daily 3)  Metformin Hcl 1000 Mg Tabs (Metformin hcl) .... 1/2 tab by mouth two times a day 4)  Viagra 100 Mg Tabs (Sildenafil citrate) .... Uad 5)  Aspirin Adult Low Strength 81 Mg Tbec (Aspirin) .... Once daily 6)  Lipitor 40 Mg Tabs (Atorvastatin calcium) .Marland Kitchen.. 1 tab @ bedtime  Other Orders: Gastroenterology Referral (GI) Admin 1st Vaccine (28413) Flu Vaccine 8yrs + (24401)  Patient Instructions: 1)  continue the good work getting off of nicotine.  Set up a program over the next month to 6 weeks to stop all nicotine products. 2)  Take 400 mg of Motrin 3 times a day with food for y .  Right shoulder pain.  If after two to 3 weeks.  The pain does not go away  call us  and we will get her set up for physical therapy 3)  Please schedule a follow-up appointment in 6 months.Marland Kitchen..250.00 4)  BMP prior to visit, ICD-9: 5)  HbgA1C prior to visit, ICD-9: 6)  Stop the Zocor, and begin Lipitor 40 mg daily.  Be sure to check with your pharmacist and  your insurance company to see if this is a covered medication Prescriptions: LIPITOR 40 MG TABS (ATORVASTATIN CALCIUM) 1 tab @ bedtime  #100 x 3   Entered and Authorized by:   Roderick Pee MD   Signed by:   Roderick Pee MD on 08/02/2010   Method used:   [facility]        [facility] [phone]* (retail)       [address]       [place], [place]  [address]       Ph: [phone] or [phone]       Fax: [phone]   RxID:   [id number] VIAGRA 100 MG  TABS (SILDENAFIL CITRATE) uad  #6 x 11   Entered and Authorized by:   Roderick Pee MD   Signed by:   Roderick Pee MD on 08/02/2010   Method  used:   [facility] to        [facility]  [phone]* (retail)       [address]       [place], [place]  [address]       Ph: [phone] or [phone]       Fax: [phone]   RxID:   [id number] METFORMIN HCL 1000 MG  TABS (METFORMIN HCL) 1/2 tab by mouth two times a day  #100 x 3   Entered and Authorized by:   Roderick Pee MD   Signed by:   Roderick Pee MD on 08/02/2010   Method used:   [facility]ronically to        [facility] [address] [phone]* (retail)       [address]       [place], [place]  [address]       Ph: [phone] or [phone]       Fax: [phone]   RxID:   [id number] ATENOLOL-CHLORTHALIDONE 50-25 MG  TABS (ATENOLOL-CHLORTHALIDONE) 1 tab once daily  #100 x 3   Entered and Authorized by:   Roderick Pee MD   Signed by:   Roderick Pee MD on 08/02/2010   Method used:   Electronically to        [facility] [address] [phone]* (retail)       [address]       [place], [place]  [address]       Ph: [phone] or [phone]       Fax: [phone]   RxID:   [id number] LISINOPRIL 5 MG  TABS (LISINOPRIL) 1 tab once daily  #100 x 3   Entered and Authorized by:   Roderick Pee MD   Signed by:   Roderick Pee MD on 08/02/2010   Method used:   [facility]ron[facility] to        [facility] [address] [phone]* (retail)       [address]       [place], [place]  [address]       Ph: [phone] or [phone]       Fax: [phone]   RxID:   [id number]    Orders Added: 1)  Prescription Created Electronically [G8553] 2)  Est. Patient 40-64 years [99396] 3)  Gastroenterology Referral [GI] 4)  EKG w/ Interpretation [93000] 5)  Admin 1st Vaccine [90471] 6)  Flu Vaccine 10yrs + [03546]

## 2010-11-15 NOTE — Miscellaneous (Signed)
Summary: Prep  Clinical Lists Changes  Medications: Added new medication of MOVIPREP 100 GM  SOLR (PEG-KCL-NACL-NASULF-NA ASC-C) As per prep instructions. - Signed Rx of MOVIPREP 100 GM  SOLR (PEG-KCL-NACL-NASULF-NA ASC-C) As per prep instructions.;  #1 x 0;  Signed;  Entered by: Clide Cliff RN;  Authorized by: Mardella Layman MD [facility];  Method used: [facility] [address] [phone]*, [address], [place], [place], [place]  [address], Ph: [phone] or [phone], Fax: [phone]    Prescriptions: MOVIPREP 100 GM  SOLR (PEG-KCL-NACL-NASULF-NA ASC-C) As per prep instructions.  #1 x 0   Entered by:   Clide Cliff RN   Authorized by:   Mardella Layman MD [facility]   Signed by:   Clide Cliff RN on 09/12/2010   Method used:   Electronically to        [facility] [address] [phone]* (retail)       [address]       [place], [place]  [address]       Ph: [phone] or [phone]       Fax: [phone]   RxID:   [id number]

## 2010-11-15 NOTE — Assessment & Plan Note (Signed)
Summary: diverticulitis?/dm   Vital Signs:  Patient profile:   55 year old male Height:      66.75 inches Weight:      178 pounds BMI:     28.19 Temp:     98.8 degrees F BP sitting:   120 / 80  (left arm) Cuff size:   regular  Vitals Entered By: Kern Reap CMA Duncan Dull) (October 20, 2009 11:00 AM)  Reason for Visit diverticulitis   History of Present Illness: Tyler Wolf is a 55 year old, married male, smoker, who comes in today for evaluation of left lower quadrant abdominal pain.  In May of 2007 he was evaluated for left lower quadrant abdominal pain via CT scan.  At that time.  It showed diverticulitis.  He was treated with Cipro and Flagyl and his symptoms resolved.  He's been well since that time until two days ago, when he began having pain in the left lower quadrant.  Again.  He has a fever, chills, nausea, vomiting, diarrhea, or change in bowel habits.  Allergies: No Known Drug Allergies  Past History:  Past medical, surgical, family and social histories (including risk factors) reviewed for relevance to current acute and chronic problems.  Past Medical History: Reviewed history from 11/27/2007 and no changes required. Diabetes mellitus, type II Hypertension ED Hyperlipidemia  Family History: Reviewed history from 05/17/2007 and no changes required. Family History Diabetes 1st degree relative  Social History: Reviewed history from 07/23/2008 and no changes required. Married Current Smoker Alcohol use-yes Drug use-no Regular exercise-no  Review of Systems      See HPI  Physical Exam  General:  Well-developed,well-nourished,in no acute distress; alert,appropriate and cooperative throughout examination Abdomen:  the abdomen is flat.  The bowel sounds are normal.  There is tenderness in the left lower quadrant to palpation.  No rebound.  No masses   Problems:  Medical Problems Added: 1)  Dx of Diverticulitis, Colon, Nos  (ICD-562.11)  Impression &  Recommendations:  Problem # 1:  DIVERTICULITIS, COLON, NOS (ICD-562.11) Assessment Deteriorated  Complete Medication List: 1)  Lisinopril 5 Mg Tabs (Lisinopril) .Marland Kitchen.. 1 tab once daily 2)  Atenolol-chlorthalidone 50-25 Mg Tabs (Atenolol-chlorthalidone) .Marland Kitchen.. 1 tab once daily 3)  Metformin Hcl 1000 Mg Tabs (Metformin hcl) .... 1/2 tab by mouth two times a day 4)  Viagra 100 Mg Tabs (Sildenafil citrate) .... Uad 5)  Aspirin Adult Low Strength 81 Mg Tbec (Aspirin) .... Once daily 6)  Zocor 80 Mg Tabs (Simvastatin) .Marland Kitchen.. 1 tab @ bedtime 7)  Ciprofloxacin Hcl 500 Mg Tabs (Ciprofloxacin hcl) .... Take 1 tablet by mouth two times a day 8)  Flagyl 500 Mg Tabs (Metronidazole) .... Take 1 tablet by mouth two times a day  Patient Instructions: 1)  See your eye doctor yearly to check for diabetic eye damage. 2)  begin Cipro and Flagyl one of each twice a day for 10 days.  Stay on a clear liquid diet until u are  pain free. 3)  Also take milk of Magnesia prune juice or MiraLax daily to clean your colon out Prescriptions: FLAGYL 500 MG TABS (METRONIDAZOLE) Take 1 tablet by mouth two times a day  #20 x 0   Entered and Authorized by:   Roderick Pee MD   Signed by:   Roderick Pee MD on 10/20/2009   Method used:   [facility]ron[facility] to        [facility] [address] [phone]* (retail)       [address]       [place], [place]  [address]       Ph: [phone] or [phone]       Fax: [phone]   RxID:   [id number] CIPROFLOXACIN HCL 500 MG TABS (CIPROFLOXACIN HCL) Take 1 tablet by mouth two times a day  #20 x 0   Entered and Authorized by:   Roderick Pee MD   Signed by:   Roderick Pee MD on 10/20/2009   Method used:   Electronically to        [facility] [address] [phone]* (retail)       [address]       [place], [place]  [address]       Ph: [phone] or [phone]       Fax: [phone]   RxID:   [id number]

## 2010-11-15 NOTE — Miscellaneous (Signed)
Summary: direct colon--ch.  Clinical Lists Changes  Allergies: Added new allergy or adverse reaction of * LATEX Observations: Added new observation of ALLERGY REV: Done (09/12/2010 13:12) Added new observation of NKA: F (09/12/2010 13:12)

## 2010-11-15 NOTE — Letter (Signed)
Summary: Diabetic Instructions  [facility]  [address] [place], [place] [address]   Phone: [phone]  Fax: [phone]    Tyler Wolf [birth year] MRN: [id number]   _  _   ORAL DIABETIC MEDICATION INSTRUCTIONS  The day before your procedure:   Take your diabetic pill as you do normally  The day of your procedure:   Do not take your diabetic pill    We will check your blood sugar levels during the admission process and again in Recovery before discharging you home  ________________________________________________________________________  _  _   INSULIN (LONG ACTING) MEDICATION INSTRUCTIONS (Lantus, NPH, 70/30, Humulin, Novolin-N)   The day before your procedure:   Take  your regular evening dose    The day of your procedure:   Do not take your morning dose    _  _   INSULIN (SHORT ACTING) MEDICATION INSTRUCTIONS (Regular, Humulog, Novolog)   The day before your procedure:   Do not take your evening dose   The day of your procedure:   Do not take your morning dose   _  _   INSULIN PUMP MEDICATION INSTRUCTIONS  We will contact the physician managing your diabetic care for written dosage instructions for the day before your procedure and the day of your procedure.  Once we have received the instructions, we will contact you.

## 2010-11-15 NOTE — Letter (Signed)
Summary: [facility] Instructions  [facility]  [address] [place], [place] [address]   Phone: [phone]  Fax: [phone]       Tyler Wolf    12-Oct-1956    MRN: [id number]        Procedure Day /Date:  09/26/10  MONDAY     Arrival Time:  9:30AM      Procedure Time:  10:30AM     Location of Procedure:                    Tyler Wolf  [facility] (4th Floor)                      PREPARATION FOR COLONOSCOPY WITH MOVIPREP   Starting 5 days prior to your procedure 09/21/10 do not eat nuts, seeds, popcorn, corn, beans, peas,  salads, or any raw vegetables.  Do not take any fiber supplements (e.g. Metamucil, Citrucel, and Benefiber).  THE DAY BEFORE YOUR PROCEDURE         DATE: 09/25/10  DAY: SUNDAY  1.  Drink clear liquids the entire day-NO SOLID FOOD  2.  Do not drink anything colored red or purple.  Avoid juices with pulp.  No orange juice.  3.  Drink at least 64 oz. (8 glasses) of fluid/clear liquids during the day to prevent dehydration and help the prep work efficiently.  CLEAR LIQUIDS INCLUDE: Water Jello Ice Popsicles Tea (sugar ok, no milk/cream) Powdered fruit flavored drinks Coffee (sugar ok, no milk/cream) Gatorade Juice: apple, white grape, white cranberry  Lemonade Clear bullion, consomm, broth Carbonated beverages (any kind) Strained chicken noodle soup Hard Candy                             4.  In the morning, mix first dose of MoviPrep solution:    Empty 1 Pouch A and 1 Pouch B into the disposable container    Add lukewarm drinking water to the top line of the container. Mix to dissolve    Refrigerate (mixed solution should be used within 24 hrs)  5.  Begin drinking the prep at 5:00 p.m. The MoviPrep container is divided by 4 marks.   Every 15 minutes drink the solution down to the next mark (approximately 8 oz) until the full liter is complete.   6.  Follow completed prep with 16 oz of clear liquid of your choice (Nothing  red or purple).  Continue to drink clear liquids until bedtime.  7.  Before going to bed, mix second dose of MoviPrep solution:    Empty 1 Pouch A and 1 Pouch B into the disposable container    Add lukewarm drinking water to the top line of the container. Mix to dissolve    Refrigerate  THE DAY OF YOUR PROCEDURE      DATE: 09/26/10   DAY: MONDAY  Beginning at 5:30AM (5 hours before procedure):         1. Every 15 minutes, drink the solution down to the next mark (approx 8 oz) until the full liter is complete.  2. Follow completed prep with 16 oz. of clear liquid of your choice.    3. You may drink clear liquids until 8:30AM (2 HOURS BEFORE PROCEDURE).   MEDICATION INSTRUCTIONS  Unless otherwise instructed, you should take regular prescription medications with a small sip of water   as early as possible the morning  of your procedure.  Diabetic patients - see separate instructions.           OTHER INSTRUCTIONS  You will need a responsible adult at least 54 years of age to accompany you and drive you home.   This person must remain in the waiting room during your procedure.  Wear loose fitting clothing that is easily removed.  Leave jewelry and other valuables at home.  However, you may wish to bring a book to read or  an iPod/MP3 player to listen to music as you wait for your procedure to start.  Remove all body piercing jewelry and leave at home.  Total time from sign-in until discharge is approximately 2-3 hours.  You should go home directly after your procedure and rest.  You can resume normal activities the  day after your procedure.  The day of your procedure you should not:   Drive   Make legal decisions   Operate machinery   Drink alcohol   Return to work  You will receive specific instructions about eating, activities and medications before you leave.    The above instructions have been reviewed and explained to me by   Clide Cliff,  RN_______________________    I fully understand and can verbalize these instructions _____________________________ Date _________

## 2010-11-17 NOTE — Procedures (Signed)
Summary: Colonoscopy  Patient: Tyler Wolf Note: All result statuses are Final unless otherwise noted.  Tests: (1) Colonoscopy (COL)   COL Colonoscopy           DONE     [facility]     [address]     [place], [place]  [address]           COLONOSCOPY PROCEDURE REPORT           PATIENT:  Tyler Wolf, Chianese  MR#:  [id number]     BIRTHDATE:  Aug 16, 1956, 54 yrs. old  GENDER:  male     ENDOSCOPIST:  Vania Rea. Jarold Motto, MD, [facility]     REF. BY:  Tinnie Gens A. Tawanna Cooler, M.D.     PROCEDURE DATE:  09/26/2010     PROCEDURE:  Colonoscopy with snare polypectomy     ASA CLASS:  Class II     INDICATIONS:  Routine Risk Screening     MEDICATIONS:   Fentanyl 50 mcg IV, Versed 5 mg IV           DESCRIPTION OF PROCEDURE:   After the risks benefits and     alternatives of the procedure were thoroughly explained, informed     consent was obtained.  Digital rectal exam was performed and     revealed no abnormalities.   The LB 180AL [id number] endoscope was     introduced through the anus and advanced to the cecum, which was     identified by both the appendix and ileocecal valve, without     limitations.  The quality of the prep was excellent, using     MoviPrep.  The instrument was then slowly withdrawn as the colon     was fully examined.     <<PROCEDUREIMAGES>>           FINDINGS:  Moderate diverticulosis was found in the sigmoid to     descending colon segments.  A sessile polyp was found. FLAT     POLYP COLD SNARE EXCISED.SIGMOID AREA.  This was otherwise a     normal examination of the colon.   Retroflexed views in the rectum     revealed no abnormalities.    The scope was then withdrawn from     the patient and the procedure completed.           COMPLICATIONS:  None     ENDOSCOPIC IMPRESSION:     1) Moderate diverticulosis in the sigmoid to descending colon     segments     2) Sessile polyp     3) Otherwise normal examination     HX. OF PRIORR DIVERTICULITIS.     RECOMMENDATIONS:  1) high fiber diet     2) Repeat colonoscopy in 5 years if polyp adenomatous; otherwise     10 years     REPEAT EXAM:  No           ______________________________     Vania Rea. Jarold Motto, MD, [facility] Graham           CC:           n.     eSIGNED:   Vania Rea. Patterson at 09/26/2010 11:11 AM           Denver Faster, [id number]  Note: An exclamation mark (!) indicates a result that was not dispersed into the flowsheet. Document Creation Date: 09/26/2010 11:11 AM _______________________________________________________________________  (1) Order result status: Final Collection or observation date-time: 09/26/2010 11:00 Requested  date-time:  Receipt date-time:  Reported date-time:  Referring Physician:   Ordering Physician: Sheryn Bison [phone]) Specimen Source:  Source: Launa Grill Order Number: [id number] Lab site:   Appended Document: Colonoscopy 10 year followup  Appended Document: Colonoscopy Recall     Procedures Next Due Date:    Colonoscopy: 10/2020

## 2010-11-17 NOTE — Letter (Signed)
Summary: Patient Notice- Polyp Results  [facility]  [address] [place], [place] [address]   Phone: [phone]  Fax: [phone]        September 27, 2010 MRN: [id number]    Tyler Wolf [address] [place], [place]  [address]    Dear Mr. Pacer,  I am pleased to inform you that the colon polyp(s) removed during your recent colonoscopy was (were) found to be benign (no cancer detected) upon pathologic examination.  I recommend you have a repeat colonoscopy examination in 10_ years to look for recurrent polyps, as having colon polyps increases your risk for having recurrent polyps or even colon cancer in the future.  Should you develop new or worsening symptoms of abdominal pain, bowel habit changes or bleeding from the rectum or bowels, please schedule an evaluation with either your primary care physician or with me.  Additional information/recommendations:  X.__ No further action with gastroenterology is needed at this time. Please      follow-up with your primary care physician for your other healthcare      needs.  __ Please call [phone] to schedule a return visit to review your      situation.  __ Please keep your follow-up visit as already scheduled.  __ Continue treatment plan as outlined the day of your exam.  Please call us if you are having persistent problems or have questions about your condition that have not been fully answered at this time.  Sincerely,  Mardella Layman MD [facility]  This letter has been electronically signed by your physician.  Appended Document: Patient Notice- Polyp Results Letter Mailed

## 2010-12-27 LAB — GLUCOSE, CAPILLARY
Glucose-Capillary: 118 mg/dL — ABNORMAL HIGH (ref 70–99)
Glucose-Capillary: 129 mg/dL — ABNORMAL HIGH (ref 70–99)

## 2011-01-26 ENCOUNTER — Other Ambulatory Visit (INDEPENDENT_AMBULATORY_CARE_PROVIDER_SITE_OTHER): Payer: 59 | Admitting: Family Medicine

## 2011-01-26 DIAGNOSIS — E119 Type 2 diabetes mellitus without complications: Secondary | ICD-10-CM

## 2011-01-26 LAB — BASIC METABOLIC PANEL
CO2: 30 mEq/L (ref 19–32)
Calcium: 9 mg/dL (ref 8.4–10.5)
Creatinine, Ser: 0.7 mg/dL (ref 0.4–1.5)
Sodium: 137 mEq/L (ref 135–145)

## 2011-01-26 LAB — HEMOGLOBIN A1C: Hgb A1c MFr Bld: 7.1 % — ABNORMAL HIGH (ref 4.6–6.5)

## 2011-02-01 ENCOUNTER — Encounter: Payer: Self-pay | Admitting: Family Medicine

## 2011-02-02 ENCOUNTER — Ambulatory Visit (INDEPENDENT_AMBULATORY_CARE_PROVIDER_SITE_OTHER): Payer: 59 | Admitting: Family Medicine

## 2011-02-02 ENCOUNTER — Encounter: Payer: Self-pay | Admitting: Family Medicine

## 2011-02-02 DIAGNOSIS — I10 Essential (primary) hypertension: Secondary | ICD-10-CM

## 2011-02-02 DIAGNOSIS — E119 Type 2 diabetes mellitus without complications: Secondary | ICD-10-CM

## 2011-02-02 MED ORDER — METFORMIN HCL 1000 MG PO TABS: 1000.0000 mg | ORAL_TABLET | Freq: Every day | ORAL | Status: DC

## 2011-02-02 NOTE — Patient Instructions (Signed)
Increase the metformin to 1000 mg prior to breakfast and continue the 500 mg dose prior to evening meal.  Check a morning blood pressure x 3 weeks if after 3 weeks.  Her blood pressure is at goal,,,,,,,,,,, 135/85 or less,,,,,,,,,, then continue current medication.  If not, at goal return to see me with the data and the device so we can readjust her medication.  Return in 3 months for follow-up fasting labs one week prior

## 2011-02-02 NOTE — Progress Notes (Signed)
  Subjective:    Patient ID: Tyler Wolf, male    DOB: Dec 23, 1955, 55 y.o.   MRN: [id number]  HPICarl is a 55 year old, married male, smoker, type II diabetic, who comes in today for follow-up of diabetes and hypertension.  His blood sugar is in the 118 range.  However, his A1c is going up.  Last time it was 6.9, now at 7.1.  He is on metformin 500 mg b.i.d.  He is also on Tenoretic 50 -- 25 daily.  BP 160/90.  Now monitoring his blood pressure at home.  He continues to smoke and declines a smoking cessation program    Review of Systems General and metabolic review of systems otherwise negative    Objective:   Physical Exam    Well-developed well-nourished  male, in no acute distress    Assessment & Plan:  Diabetes,,,,,,,,,, night goal increase metformin to 1000 mg prior to breakfast.  Continue 500-mg dose prior to evening meal.  Hypertension,,,,,,,,,,,, question control,,,,,,,,,, BP checked q.a.m. X 3 weeks if blood pressure drops back to normal 135/85 or less than continue current therapy.  If blood pressure continues to stay elevated ,,,,,,,,,, data and device and return so we can readjust his medication

## 2011-03-17 LAB — HM DIABETES EYE EXAM: HM Diabetic Eye Exam: NORMAL

## 2011-04-27 ENCOUNTER — Other Ambulatory Visit (INDEPENDENT_AMBULATORY_CARE_PROVIDER_SITE_OTHER): Payer: 59

## 2011-04-27 DIAGNOSIS — E119 Type 2 diabetes mellitus without complications: Secondary | ICD-10-CM

## 2011-04-27 LAB — BASIC METABOLIC PANEL
Chloride: 99 mEq/L (ref 96–112)
GFR: 123.04 mL/min (ref >60.00)
Potassium: 4.1 mEq/L (ref 3.5–5.1)
Sodium: 142 mEq/L (ref 135–145)

## 2011-05-04 ENCOUNTER — Encounter: Payer: Self-pay | Admitting: Family Medicine

## 2011-05-04 ENCOUNTER — Ambulatory Visit (INDEPENDENT_AMBULATORY_CARE_PROVIDER_SITE_OTHER): Payer: 59 | Admitting: Family Medicine

## 2011-05-04 DIAGNOSIS — E119 Type 2 diabetes mellitus without complications: Secondary | ICD-10-CM

## 2011-05-04 DIAGNOSIS — I10 Essential (primary) hypertension: Secondary | ICD-10-CM

## 2011-05-04 MED ORDER — METFORMIN HCL 850 MG PO TABS: ORAL_TABLET | ORAL | Status: DC

## 2011-05-04 MED ORDER — ATORVASTATIN CALCIUM 80 MG PO TABS: ORAL_TABLET | ORAL | Status: DC

## 2011-05-04 NOTE — Progress Notes (Signed)
  Subjective:    Patient ID: Tyler Wolf, male    DOB: April 30, 1956, 55 y.o.   MRN: [id number]  HPI Dominik Is a 55 year old, married male, smoker, who comes in today for evaluation of diabetes and hypertension and tobacco abuse.  He taking metformin 500 mg b.i.d. Blood sugar is in the 125 range at home.  We got 130 and A1c up to 7.3%.  He is not overweight.  He is 89, and he walks on a daily basis, and follows a fairly good.  Diet.  Will therefore increase his medication.  For hypertension.  He takes the Tenoretic one a day, along with lisinopril 5 mg a day.  BP 130/90.  Again, discussed the smoking cessation program.  He has tried chantix in the past and said it didn't help.  He stand voluntarily to 10 a day.  He would like to taper off himself encouraged to try   Review of Systems General in metabolic review of systems otherwise negative    Objective:   Physical Exam    Well-nourished man no acute distress   Assessment & Plan:  Diabetes type 2, with increasing A1c increase metformin to 875 mg prior to breakfast.  Continue the 500-mg dose prior to evening meal.  Follow-up A1c in 3 months.  Hypertension.  Continue above medications.  Smoking cessation.  Follow tapering program as outlined

## 2011-05-04 NOTE — Patient Instructions (Signed)
Increase the metformin by taking 850 mg prior to breakfast and continue the evening dose at 500 mg prior to evening.  The  Begin a tapering program of cigarettes go from 10,,,,,,,, 9,,,,,,,,,, 8 ..............Marland Kitchen  Return in the fall for your annual exam

## 2011-08-03 ENCOUNTER — Other Ambulatory Visit (INDEPENDENT_AMBULATORY_CARE_PROVIDER_SITE_OTHER): Payer: 59

## 2011-08-03 DIAGNOSIS — I10 Essential (primary) hypertension: Secondary | ICD-10-CM

## 2011-08-03 DIAGNOSIS — E119 Type 2 diabetes mellitus without complications: Secondary | ICD-10-CM

## 2011-08-03 LAB — CBC WITH DIFFERENTIAL/PLATELET
Basophils Absolute: 0 10*3/uL (ref 0.0–0.1)
Basophils Relative: 0.3 % (ref 0.0–3.0)
Eosinophils Absolute: 0.2 10*3/uL (ref 0.0–0.7)
Lymphocytes Relative: 25.2 % (ref 12.0–46.0)
MCHC: 34 g/dL (ref 30.0–36.0)
Neutrophils Relative %: 64.1 % (ref 43.0–77.0)
RBC: 4.93 Mil/uL (ref 4.22–5.81)

## 2011-08-03 LAB — MICROALBUMIN / CREATININE URINE RATIO
Creatinine,U: 156.2 mg/dL
Microalb Creat Ratio: 0.3 mg/g (ref 0.0–30.0)
Microalb, Ur: 0.5 mg/dL (ref 0.0–1.9)

## 2011-08-03 LAB — HEMOGLOBIN A1C: Hgb A1c MFr Bld: 7 % — ABNORMAL HIGH (ref 4.6–6.5)

## 2011-08-03 LAB — HEPATIC FUNCTION PANEL
Alkaline Phosphatase: 67 U/L (ref 39–117)
Bilirubin, Direct: 0.1 mg/dL (ref 0.0–0.3)
Total Protein: 7.2 g/dL (ref 6.0–8.3)

## 2011-08-03 LAB — BASIC METABOLIC PANEL
CO2: 31 mEq/L (ref 19–32)
Calcium: 9.4 mg/dL (ref 8.4–10.5)
Creatinine, Ser: 0.8 mg/dL (ref 0.4–1.5)

## 2011-08-03 LAB — LIPID PANEL
HDL: 34.9 mg/dL — ABNORMAL LOW (ref >39.00)
LDL Cholesterol: 89 mg/dL (ref 0–99)
Total CHOL/HDL Ratio: 4
VLDL: 29 mg/dL (ref 0.0–40.0)

## 2011-08-03 LAB — POCT URINALYSIS DIPSTICK
Leukocytes, UA: NEGATIVE
Nitrite, UA: NEGATIVE
Protein, UA: NEGATIVE
Urobilinogen, UA: 0.2
pH, UA: 6

## 2011-08-10 ENCOUNTER — Encounter: Payer: Self-pay | Admitting: Family Medicine

## 2011-08-10 ENCOUNTER — Ambulatory Visit (INDEPENDENT_AMBULATORY_CARE_PROVIDER_SITE_OTHER): Payer: 59 | Admitting: Family Medicine

## 2011-08-10 DIAGNOSIS — I10 Essential (primary) hypertension: Secondary | ICD-10-CM

## 2011-08-10 DIAGNOSIS — E119 Type 2 diabetes mellitus without complications: Secondary | ICD-10-CM

## 2011-08-10 DIAGNOSIS — F17200 Nicotine dependence, unspecified, uncomplicated: Secondary | ICD-10-CM

## 2011-08-10 DIAGNOSIS — N529 Male erectile dysfunction, unspecified: Secondary | ICD-10-CM

## 2011-08-10 DIAGNOSIS — E782 Mixed hyperlipidemia: Secondary | ICD-10-CM

## 2011-08-10 DIAGNOSIS — Z23 Encounter for immunization: Secondary | ICD-10-CM

## 2011-08-10 MED ORDER — METFORMIN HCL 1000 MG PO TABS: ORAL_TABLET | ORAL | Status: DC

## 2011-08-10 MED ORDER — METFORMIN HCL 850 MG PO TABS: ORAL_TABLET | ORAL | Status: DC

## 2011-08-10 MED ORDER — VARENICLINE TARTRATE 1 MG PO TABS: 1.0000 mg | ORAL_TABLET | Freq: Two times a day (BID) | ORAL | Status: DC

## 2011-08-10 MED ORDER — ATENOLOL-CHLORTHALIDONE 50-25 MG PO TABS: 1.0000 | ORAL_TABLET | Freq: Every day | ORAL | Status: DC

## 2011-08-10 MED ORDER — LISINOPRIL 5 MG PO TABS: 5.0000 mg | ORAL_TABLET | Freq: Every day | ORAL | Status: DC

## 2011-08-10 MED ORDER — SILDENAFIL CITRATE 100 MG PO TABS: 100.0000 mg | ORAL_TABLET | Freq: Every day | ORAL | Status: DC | PRN

## 2011-08-10 MED ORDER — ATORVASTATIN CALCIUM 80 MG PO TABS: ORAL_TABLET | ORAL | Status: DC

## 2011-08-10 NOTE — Progress Notes (Signed)
  Subjective:    Patient ID: Tyler Wolf, male    DOB: 1956/10/06, 55 y.o.   MRN: [id number]  HPI Tyler Wolf is a 55 year old, married male, smoker of half a pack of cigarettes a day..... Comes in today for physical examination because of diabetes, type II hypertension, hyperlipidemia, and erectile dysfunction, and tobacco abuse.  I explained to Tyler Wolf that if Tyler Wolf continues to smoke with his underlying health habits.  His chances of a heart attack and stroke of the neck for 5 years for close to hundred percent.  I insisted that Tyler Wolf try to stop smoking again.  Tyler Wolf tried before with the Entex, but Tyler Wolf said it didn't help.  Will try again.  His diabetes is managed with metformin 850 mg before breakfast and 500 mg prior to his evening meal.  Tyler Wolf takes Tenoretic 50 -- 25 daily for hypertension.  BP 12480.  Tyler Wolf also takes a baby aspirin, and 5 mg of lisinopril daily.  His lipids are treated with Lipitor 80 mg daily.  Tyler Wolf also uses slender 100 mg p.r.n. For ED.   Review of Systems  Constitutional: Negative.   HENT: Negative.   Eyes: Negative.   Respiratory: Negative.   Cardiovascular: Negative.   Gastrointestinal: Negative.   Genitourinary: Negative.   Musculoskeletal: Negative.   Skin: Negative.   Neurological: Negative.   Hematological: Negative.   Psychiatric/Behavioral: Negative.        Objective:   Physical Exam  Constitutional: Tyler Wolf is oriented to person, place, and time. Tyler Wolf appears well-developed and well-nourished.  HENT:  Head: Normocephalic and atraumatic.  Right Ear: External ear normal.  Left Ear: External ear normal.  Nose: Nose normal.  Mouth/Throat: Oropharynx is clear and moist.  Eyes: Conjunctivae and EOM are normal. Pupils are equal, round, and reactive to light.  Neck: Normal range of motion. Neck supple. No JVD present. No tracheal deviation present. No thyromegaly present.  Cardiovascular: Normal rate, regular rhythm, normal heart sounds and intact distal pulses.  Exam  reveals no gallop and no friction rub.   No murmur heard. Pulmonary/Chest: Effort normal and breath sounds normal. No stridor. No respiratory distress. Tyler Wolf has no wheezes. Tyler Wolf has no rales. Tyler Wolf exhibits no tenderness.  Abdominal: Soft. Bowel sounds are normal. Tyler Wolf exhibits no distension and no mass. There is no tenderness. There is no rebound and no guarding.  Genitourinary: Rectum normal, prostate normal and penis normal. Guaiac negative stool. No penile tenderness.  Musculoskeletal: Normal range of motion. Tyler Wolf exhibits no edema and no tenderness.  Lymphadenopathy:    Tyler Wolf has no cervical adenopathy.  Neurological: Tyler Wolf is alert and oriented to person, place, and time. Tyler Wolf has normal reflexes. No cranial nerve deficit. Tyler Wolf exhibits normal muscle tone.  Skin: Skin is warm and dry. No rash noted. No erythema. No pallor.  Psychiatric: Tyler Wolf has a normal mood and affect. His behavior is normal. Judgment and thought content normal.          Assessment & Plan:  Diabetes type 2, under fairly good control continue above therapy.  Number two hypertension.  It goal continue above therapy.  Number 3 hyperlipidemia.  Continue Lipitor 80 mg daily.  Continue aspirin and lisinopril.  We start the Cialis one half tab daily begin tapering program.  Follow-up in 4 weeks

## 2011-08-10 NOTE — Patient Instructions (Signed)
Continue your current medications.  Restart the Chantix............. half tab daily in the morning and begin a tapering program...........7............6...Marland Kitcheninfluenza other words, decrease by one cigarette per week.  Follow-up with me in 4 weeks

## 2011-08-25 ENCOUNTER — Other Ambulatory Visit: Payer: Self-pay | Admitting: Family Medicine

## 2011-09-05 ENCOUNTER — Ambulatory Visit: Payer: 59 | Admitting: Family Medicine

## 2012-02-08 ENCOUNTER — Ambulatory Visit: Payer: 59 | Admitting: Family Medicine

## 2012-02-13 ENCOUNTER — Ambulatory Visit (INDEPENDENT_AMBULATORY_CARE_PROVIDER_SITE_OTHER): Payer: 59 | Admitting: Family Medicine

## 2012-02-13 ENCOUNTER — Encounter: Payer: Self-pay | Admitting: Family Medicine

## 2012-02-13 VITALS — BP 110/76 | Temp 98.0°F | Wt 184.0 lb

## 2012-02-13 DIAGNOSIS — E119 Type 2 diabetes mellitus without complications: Secondary | ICD-10-CM

## 2012-02-13 DIAGNOSIS — I10 Essential (primary) hypertension: Secondary | ICD-10-CM

## 2012-02-13 DIAGNOSIS — F17200 Nicotine dependence, unspecified, uncomplicated: Secondary | ICD-10-CM

## 2012-02-13 NOTE — Progress Notes (Signed)
  Subjective:    Patient ID: Tyler Wolf, male    DOB: 20-Aug-1956, 56 y.o.   MRN: [id number]  HPI Tyler Wolf is a 56 year old married male who comes in today for followup of diabetes type 2 hypertension and tobacco abuse  He is currently on metformin 850 mg before breakfast and 500 mg before his evening meal fasting blood sugars twice weekly 1 07/17/2019. Last A1c 6 months ago 7.0% no hypoglycemia  Blood pressure normal 110/76 on Tenoretic and Zestril  He continues to taper off for nicotine he is now using e cigarettes   Review of Systems    general and metabolic review of systems otherwise negative Objective:   Physical Exam  Well-developed well-nourished male in no acute distress      Assessment & Plan:  Diabetes type 2 at goal check A1c  Hypertension adult continue current therapy  Tobacco abuse continue to taper off nicotine as outlined

## 2012-02-13 NOTE — Patient Instructions (Signed)
Continue your current medications  Continue to taper off the nicotine  Chantix one half tab daily as needed to help you taper  I will call you with a report ontoday's lab work  Followup in the fall for your annual exam sooner if any problems

## 2012-02-14 LAB — BASIC METABOLIC PANEL
CO2: 28 mEq/L (ref 19–32)
Calcium: 9.3 mg/dL (ref 8.4–10.5)
Chloride: 103 mEq/L (ref 96–112)
Creatinine, Ser: 0.7 mg/dL (ref 0.4–1.5)
Glucose, Bld: 117 mg/dL — ABNORMAL HIGH (ref 70–99)

## 2012-03-06 ENCOUNTER — Other Ambulatory Visit: Payer: Self-pay | Admitting: Family Medicine

## 2012-04-16 ENCOUNTER — Encounter: Payer: Self-pay | Admitting: Family Medicine

## 2012-05-12 ENCOUNTER — Other Ambulatory Visit: Payer: Self-pay | Admitting: Family Medicine

## 2012-08-05 ENCOUNTER — Other Ambulatory Visit (INDEPENDENT_AMBULATORY_CARE_PROVIDER_SITE_OTHER): Payer: 59

## 2012-08-05 DIAGNOSIS — I10 Essential (primary) hypertension: Secondary | ICD-10-CM

## 2012-08-05 DIAGNOSIS — E119 Type 2 diabetes mellitus without complications: Secondary | ICD-10-CM

## 2012-08-05 DIAGNOSIS — F17200 Nicotine dependence, unspecified, uncomplicated: Secondary | ICD-10-CM

## 2012-08-05 LAB — LIPID PANEL
Cholesterol: 160 mg/dL (ref 0–200)
HDL: 34.2 mg/dL — ABNORMAL LOW (ref >39.00)
VLDL: 36.4 mg/dL (ref 0.0–40.0)

## 2012-08-05 LAB — HEPATIC FUNCTION PANEL
ALT: 40 U/L (ref 0–53)
AST: 22 U/L (ref 0–37)
Total Bilirubin: 0.7 mg/dL (ref 0.3–1.2)
Total Protein: 6.8 g/dL (ref 6.0–8.3)

## 2012-08-05 LAB — BASIC METABOLIC PANEL
BUN: 18 mg/dL (ref 6–23)
CO2: 30 mEq/L (ref 19–32)
Chloride: 102 mEq/L (ref 96–112)
Creatinine, Ser: 0.7 mg/dL (ref 0.4–1.5)
Glucose, Bld: 160 mg/dL — ABNORMAL HIGH (ref 70–99)

## 2012-08-05 LAB — CBC WITH DIFFERENTIAL/PLATELET
Basophils Absolute: 0 10*3/uL (ref 0.0–0.1)
HCT: 45.7 % (ref 39.0–52.0)
Lymphs Abs: 1.9 10*3/uL (ref 0.7–4.0)
MCV: 93.2 fl (ref 78.0–100.0)
Monocytes Absolute: 0.6 10*3/uL (ref 0.1–1.0)
Neutrophils Relative %: 64.9 % (ref 43.0–77.0)
Platelets: 224 10*3/uL (ref 150.0–400.0)
RDW: 13.1 % (ref 11.5–14.6)

## 2012-08-05 LAB — POCT URINALYSIS DIPSTICK
Blood, UA: NEGATIVE
Ketones, UA: NEGATIVE
Protein, UA: NEGATIVE
Spec Grav, UA: 1.025
pH, UA: 7

## 2012-08-05 LAB — TSH: TSH: 2.89 u[IU]/mL (ref 0.35–5.50)

## 2012-08-05 LAB — PSA: PSA: 0.48 ng/mL (ref 0.10–4.00)

## 2012-08-05 LAB — HEMOGLOBIN A1C: Hgb A1c MFr Bld: 7.9 % — ABNORMAL HIGH (ref 4.6–6.5)

## 2012-08-12 ENCOUNTER — Encounter: Payer: Self-pay | Admitting: Family Medicine

## 2012-08-12 ENCOUNTER — Ambulatory Visit (INDEPENDENT_AMBULATORY_CARE_PROVIDER_SITE_OTHER): Payer: 59 | Admitting: Family Medicine

## 2012-08-12 VITALS — BP 120/78 | Temp 97.8°F | Ht 67.5 in | Wt 186.0 lb

## 2012-08-12 DIAGNOSIS — E119 Type 2 diabetes mellitus without complications: Secondary | ICD-10-CM

## 2012-08-12 DIAGNOSIS — I10 Essential (primary) hypertension: Secondary | ICD-10-CM

## 2012-08-12 DIAGNOSIS — Z23 Encounter for immunization: Secondary | ICD-10-CM

## 2012-08-12 DIAGNOSIS — F17200 Nicotine dependence, unspecified, uncomplicated: Secondary | ICD-10-CM

## 2012-08-12 DIAGNOSIS — E782 Mixed hyperlipidemia: Secondary | ICD-10-CM

## 2012-08-12 DIAGNOSIS — N529 Male erectile dysfunction, unspecified: Secondary | ICD-10-CM

## 2012-08-12 MED ORDER — ATORVASTATIN CALCIUM 80 MG PO TABS: 80.0000 mg | ORAL_TABLET | Freq: Every day | ORAL | Status: DC

## 2012-08-12 MED ORDER — ATENOLOL-CHLORTHALIDONE 50-25 MG PO TABS: 1.0000 | ORAL_TABLET | Freq: Every day | ORAL | Status: DC

## 2012-08-12 MED ORDER — VARENICLINE TARTRATE 1 MG PO TABS: ORAL_TABLET | ORAL | Status: DC

## 2012-08-12 MED ORDER — LISINOPRIL 5 MG PO TABS: 5.0000 mg | ORAL_TABLET | Freq: Every day | ORAL | Status: DC

## 2012-08-12 MED ORDER — SILDENAFIL CITRATE 100 MG PO TABS: 100.0000 mg | ORAL_TABLET | Freq: Every day | ORAL | Status: DC | PRN

## 2012-08-12 MED ORDER — METFORMIN HCL 850 MG PO TABS: 850.0000 mg | ORAL_TABLET | Freq: Two times a day (BID) | ORAL | Status: DC

## 2012-08-12 NOTE — Progress Notes (Signed)
  Subjective:    Patient ID: Tyler Wolf, male    DOB: 06/23/1956, 56 y.o.   MRN: [id number]  HPI Tyler Wolf is a 56 year old married male smoker of 6 cigarettes a day...Marland KitchenMarland KitchenMarland Kitchen who comes in today for general physical examination because of a history of hypertension, diabetes type 2, hyperlipidemia, tobacco abuse, and erectile dysfunction  His medications were reviewed the been no changes. His fasting blood sugar is 160 A1c up to 7.9%. A year ago was 7.0%. His weight is stable 186 his medications remain the same 850 mg of metformin before breakfast and 500 mg before his evening meal. Blood pressure and lipids are normal. Tetanus booster 2010  Review of systems negative no neuropathy  He gets routine eye care, dental care,   Review of Systems  Constitutional: Negative.   HENT: Negative.   Eyes: Negative.   Respiratory: Negative.   Cardiovascular: Negative.   Gastrointestinal: Negative.   Genitourinary: Negative.   Musculoskeletal: Negative.   Skin: Negative.   Neurological: Negative.   Hematological: Negative.   Psychiatric/Behavioral: Negative.        Objective:   Physical Exam  Constitutional: He is oriented to person, place, and time. He appears well-developed and well-nourished.  HENT:  Head: Normocephalic and atraumatic.  Right Ear: External ear normal.  Left Ear: External ear normal.  Nose: Nose normal.  Mouth/Throat: Oropharynx is clear and moist.  Eyes: Conjunctivae normal and EOM are normal. Pupils are equal, round, and reactive to light.  Neck: Normal range of motion. Neck supple. No JVD present. No tracheal deviation present. No thyromegaly present.  Cardiovascular: Normal rate, regular rhythm, normal heart sounds and intact distal pulses.  Exam reveals no gallop and no friction rub.   No murmur heard. Pulmonary/Chest: Effort normal and breath sounds normal. No stridor. No respiratory distress. He has no wheezes. He has no rales. He exhibits no tenderness.  Abdominal:  Soft. Bowel sounds are normal. He exhibits no distension and no mass. There is no tenderness. There is no rebound and no guarding.  Genitourinary: Rectum normal, prostate normal and penis normal. Guaiac negative stool. No penile tenderness.  Musculoskeletal: Normal range of motion. He exhibits no edema and no tenderness.  Lymphadenopathy:    He has no cervical adenopathy.  Neurological: He is alert and oriented to person, place, and time. He has normal reflexes. No cranial nerve deficit. He exhibits normal muscle tone.  Skin: Skin is warm and dry. No rash noted. No erythema. No pallor.  Psychiatric: He has a normal mood and affect. His behavior is normal. Judgment and thought content normal.          Assessment & Plan:  Healthy male  Diabetes type 2 increase metformin to 850 mg twice a day followup in 3 months  Hypertension at goal continue current therapy  Hype lipidemia goal continue current therapy  Tobacco abuse restart the Chantix we'll program one half tab every morning followup in 3 months  Erectile dysfunction continue Viagra when necessary

## 2012-08-12 NOTE — Patient Instructions (Addendum)
Increase the metformin to 850 mg twice daily,,,,,,,, remember to take it before eating  Restart the Chantix program,,,,,, one half tab every morning  Followup in 3 months  Labs one week prior

## 2012-09-19 ENCOUNTER — Ambulatory Visit (INDEPENDENT_AMBULATORY_CARE_PROVIDER_SITE_OTHER): Payer: Self-pay | Admitting: Internal Medicine

## 2012-09-19 ENCOUNTER — Encounter: Payer: Self-pay | Admitting: Internal Medicine

## 2012-09-19 VITALS — BP 126/80 | HR 83 | Temp 98.2°F | Resp 18 | Wt 184.0 lb

## 2012-09-19 DIAGNOSIS — F17200 Nicotine dependence, unspecified, uncomplicated: Secondary | ICD-10-CM

## 2012-09-19 DIAGNOSIS — J069 Acute upper respiratory infection, unspecified: Secondary | ICD-10-CM

## 2012-09-19 MED ORDER — HYDROCODONE-HOMATROPINE 5-1.5 MG/5ML PO SYRP: 5.0000 mL | ORAL_SOLUTION | Freq: Four times a day (QID) | ORAL | Status: AC | PRN

## 2012-09-19 NOTE — Progress Notes (Signed)
Subjective:    Patient ID: Tyler Wolf, male    DOB: [birth year], 56 y.o.   MRN: [id number]  HPI  56 year old patient who has a history of diabetes hypertension and ongoing tobacco use. He has cut his tobacco consumption in the down significantly. For the past 4 days she has had head and chest congestion and productive cough. Denies any fever or chills. He's been using Mucinex.  Past Medical History  Diagnosis Date  . DIABETES MELLITUS, TYPE II 05/17/2007  . DIVERTICULITIS, ACUTE 08/17/2010  . 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 HYPERLIPIDEMIA, MIXED 05/27/2007  . HYPERTENSION 05/17/2007  . TOBACCO ABUSE 05/27/2007    History   Social History  . Marital Status: Married    Spouse Name: N/A    Number of Children: N/A  . Years of Education: N/A   Occupational History  . Not on file.   Social History Main Topics  . Smoking status: Current Every Day Smoker -- 0.5 packs/day  . Smokeless tobacco: Not on file     Comment: electronic cigarettes  . Alcohol Use: No  . Drug Use: No  . Sexually Active: Not on file   Other Topics Concern  . Not on file   Social History Narrative  . No narrative on file    No past surgical history on file.  Family History  Problem Relation Age of Onset  . Diabetes Neg Hx     1st degree relative     Allergies  Allergen Reactions  . Latex     REACTION: Rash    Current Outpatient Prescriptions on File Prior to Visit  Medication Sig Dispense Refill  . aspirin 81 MG tablet Take 81 mg by mouth daily.        Marland Kitchen atenolol-chlorthalidone (TENORETIC) 50-25 MG per tablet Take 1 tablet by mouth daily.  100 tablet  3  . atorvastatin (LIPITOR) 80 MG tablet Take 1 tablet (80 mg total) by mouth daily. One p.o. nightly  100 tablet  3  . lisinopril (PRINIVIL,ZESTRIL) 5 MG tablet Take 1 tablet (5 mg total) by mouth daily.  100 tablet  3  . metFORMIN (GLUCOPHAGE) 850 MG tablet Take 1 tablet (850 mg total) by mouth 2 (two) times daily with a meal. One p.o.  Q.a.m., prior to breakfast  200 tablet  3  . sildenafil (VIAGRA) 100 MG tablet Take 1 tablet (100 mg total) by mouth daily as needed. As directed  6 tablet  11  . varenicline (CHANTIX CONTINUING MONTH PAK) 1 MG tablet One half tab every morning  30 tablet  4    BP 126/80  Pulse 83  Temp 98.2 F (36.8 C) (Oral)  Resp 18  Wt 184 lb (83.462 kg)  SpO2 96%       Review of Systems  Constitutional: Positive for fatigue. Negative for fever, chills and appetite change.  HENT: Positive for congestion and rhinorrhea. Negative for hearing loss, ear pain, sore throat, trouble swallowing, neck stiffness, dental problem, voice change and tinnitus.   Eyes: Negative for pain, discharge and visual disturbance.  Respiratory: Positive for cough. Negative for chest tightness, wheezing and stridor.   Cardiovascular: Negative for chest pain, palpitations and leg swelling.  Gastrointestinal: Negative for nausea, vomiting, abdominal pain, diarrhea, constipation, blood in stool and abdominal distention.  Genitourinary: Negative for urgency, hematuria, flank pain, discharge, difficulty urinating and genital sores.  Musculoskeletal: Negative for myalgias, back pain, joint swelling, arthralgias and gait problem.  Skin: Negative for rash.  Neurological:  Negative for dizziness, syncope, speech difficulty, weakness, numbness and headaches.  Hematological: Negative for adenopathy. Does not bruise/bleed easily.  Psychiatric/Behavioral: Negative for behavioral problems and dysphoric mood. The patient is not nervous/anxious.        Objective:   Physical Exam  Constitutional: He is oriented to person, place, and time. He appears well-developed.  HENT:  Head: Normocephalic.  Right Ear: External ear normal.  Left Ear: External ear normal.  Eyes: Conjunctivae normal and EOM are normal.  Neck: Normal range of motion.  Cardiovascular: Normal rate and normal heart sounds.   Pulmonary/Chest: Breath sounds normal.   Abdominal: Bowel sounds are normal.  Musculoskeletal: Normal range of motion. He exhibits no edema and no tenderness.  Neurological: He is alert and oriented to person, place, and time.  Psychiatric: He has a normal mood and affect. His behavior is normal.          Assessment & Plan:   Viral URI with cough. We'll treat symptomatically Cessation of smoking encouraged

## 2012-09-19 NOTE — Patient Instructions (Addendum)
Acute sinusitis symptoms for less than 10 days are generally not helped by antibiotic therapy.  Use saline irrigation, warm  moist compresses and over-the-counter decongestants only as directed.  Call if there is no improvement in 5 to 7 days, or sooner if you develop increasing pain, fever, or any new symptoms.  Mucinex  use twice daily

## 2012-09-20 ENCOUNTER — Ambulatory Visit: Payer: 59 | Admitting: Internal Medicine

## 2012-09-30 ENCOUNTER — Other Ambulatory Visit (INDEPENDENT_AMBULATORY_CARE_PROVIDER_SITE_OTHER): Payer: 59

## 2012-09-30 DIAGNOSIS — E119 Type 2 diabetes mellitus without complications: Secondary | ICD-10-CM

## 2012-09-30 LAB — BASIC METABOLIC PANEL
BUN: 22 mg/dL (ref 6–23)
CO2: 33 mEq/L — ABNORMAL HIGH (ref 19–32)
Chloride: 98 mEq/L (ref 96–112)
Glucose, Bld: 154 mg/dL — ABNORMAL HIGH (ref 70–99)
Potassium: 4.5 mEq/L (ref 3.5–5.1)

## 2012-10-07 ENCOUNTER — Ambulatory Visit: Payer: 59 | Admitting: Family Medicine

## 2012-10-18 ENCOUNTER — Encounter: Payer: Self-pay | Admitting: Family Medicine

## 2012-10-18 ENCOUNTER — Ambulatory Visit (INDEPENDENT_AMBULATORY_CARE_PROVIDER_SITE_OTHER): Payer: BC Managed Care – PPO | Admitting: Family Medicine

## 2012-10-18 VITALS — BP 152/90 | Temp 98.1°F | Wt 187.0 lb

## 2012-10-18 DIAGNOSIS — F17200 Nicotine dependence, unspecified, uncomplicated: Secondary | ICD-10-CM

## 2012-10-18 DIAGNOSIS — Z23 Encounter for immunization: Secondary | ICD-10-CM

## 2012-10-18 DIAGNOSIS — E119 Type 2 diabetes mellitus without complications: Secondary | ICD-10-CM

## 2012-10-18 DIAGNOSIS — I10 Essential (primary) hypertension: Secondary | ICD-10-CM

## 2012-10-18 MED ORDER — VARENICLINE TARTRATE 1 MG PO TABS: ORAL_TABLET | ORAL | Status: DC

## 2012-10-18 MED ORDER — METFORMIN HCL 1000 MG PO TABS: ORAL_TABLET | ORAL | Status: DC

## 2012-10-18 NOTE — Patient Instructions (Signed)
Increase the metformin to 1000 mg twice daily  Continue your current blood pressure medication and check a blood pressure in the morning Monday Wednesday Friday  Check a blood sugar also Monday Wednesday Friday  Return in 3 months for followup  Nonfasting labs one week prior  Retry this Chantix,,,,,,,,,,,, one half tab daily for 2 weeks then one half tab twice daily also begin tapering as outlined

## 2012-10-18 NOTE — Progress Notes (Signed)
  Subjective:    Patient ID: Tyler Wolf, male    DOB: 08-25-1956, 57 y.o.   MRN: [id number]  HPI ferrel simington is a 57 year old married male smoker who comes in today for evaluation of 3 issues  He is on 850 mg of metformin twice a day blood sugar 2 weeks ago was 154 A1c is up from 7.92 months ago to 8.1%.  BP today 152/90 on Tenoretic 1 daily along with 5 mg of lisinopril daily  He continues to smoke he states he can't afford the Chantix. He states he tried at one time but it didn't help. We will talk about other options   Review of Systems    general and metabolic review of systems otherwise negative Objective:   Physical Exam Well-developed well-nourished male no acute distress       Assessment & Plan:  Diabetes type 2 not at goal increase metformin to 1000 mg twice a day followup A1c in 3 months  Hypertension question goal BP check every morning fax data in 3 weeks  Tobacco abuse he understands the negative impact and smoking including lung cancer heart attack and stroke. We again outlined the smoking cessation program and another trial of the Chantix

## 2012-10-18 NOTE — Addendum Note (Signed)
Addended by: Alfred Levins D on: 10/18/2012 10:55 AM   Modules accepted: Orders

## 2013-01-16 ENCOUNTER — Other Ambulatory Visit (INDEPENDENT_AMBULATORY_CARE_PROVIDER_SITE_OTHER): Payer: BC Managed Care – PPO

## 2013-01-16 ENCOUNTER — Other Ambulatory Visit: Payer: Self-pay | Admitting: Family Medicine

## 2013-01-16 DIAGNOSIS — E119 Type 2 diabetes mellitus without complications: Secondary | ICD-10-CM

## 2013-01-16 LAB — BASIC METABOLIC PANEL
Calcium: 8.9 mg/dL (ref 8.4–10.5)
Creatinine, Ser: 0.7 mg/dL (ref 0.4–1.5)
GFR: 116.56 mL/min (ref >60.00)
Glucose, Bld: 157 mg/dL — ABNORMAL HIGH (ref 70–99)
Sodium: 134 mEq/L — ABNORMAL LOW (ref 135–145)

## 2013-01-23 ENCOUNTER — Encounter: Payer: Self-pay | Admitting: Family Medicine

## 2013-01-23 ENCOUNTER — Ambulatory Visit (INDEPENDENT_AMBULATORY_CARE_PROVIDER_SITE_OTHER): Payer: BC Managed Care – PPO | Admitting: Family Medicine

## 2013-01-23 VITALS — BP 130/90 | Temp 98.0°F | Wt 184.0 lb

## 2013-01-23 DIAGNOSIS — F17200 Nicotine dependence, unspecified, uncomplicated: Secondary | ICD-10-CM

## 2013-01-23 DIAGNOSIS — E119 Type 2 diabetes mellitus without complications: Secondary | ICD-10-CM

## 2013-01-23 DIAGNOSIS — I10 Essential (primary) hypertension: Secondary | ICD-10-CM

## 2013-01-23 NOTE — Patient Instructions (Addendum)
Continue to take your medication daily  Check a fasting blood sugar and blood pressure once weekly  Return in 3 months for followup  Labs one week prior

## 2013-01-23 NOTE — Progress Notes (Signed)
  Subjective:    Patient ID: Tyler Wolf, male    DOB: 03/23/1956, 57 y.o.   MRN: [id number]  HPI Tyler Wolf is a 57 year old married male smoker who comes in today for evaluation of 3 issues  His blood sugar a week ago here was 157 with a hemoglobin A1c of 7.9. In December was 8.1%. He states that in 2022-12-06 his father died and he stopped all his medication. He does restart him about a month ago.  His blood pressure has normalized 130/90  He's off of smoking however using the inhaled nicotine cigarettes.   Review of Systems Review of systems otherwise negative    Objective:   Physical Exam  Well-developed well-nourished male in no acute distress BP right arm sitting position 130/90      Assessment & Plan:  Diabetes type 2 not at goal plan continue metformin 1000 mg twice a day again reinforced diet exercise followup in 3 months  Hypertension slightly elevated the office medicine for a month BP check 3 times weekly followup in 3 months  Tobacco abuse continue using the smokeless nicotine

## 2013-04-14 LAB — HM DIABETES EYE EXAM: HM Diabetic Eye Exam: NORMAL

## 2013-04-16 ENCOUNTER — Ambulatory Visit: Payer: BC Managed Care – PPO | Admitting: Family Medicine

## 2013-04-17 ENCOUNTER — Ambulatory Visit: Payer: BC Managed Care – PPO | Admitting: Family Medicine

## 2013-04-17 ENCOUNTER — Ambulatory Visit (INDEPENDENT_AMBULATORY_CARE_PROVIDER_SITE_OTHER): Payer: BC Managed Care – PPO | Admitting: Family Medicine

## 2013-04-17 ENCOUNTER — Encounter: Payer: Self-pay | Admitting: Family Medicine

## 2013-04-17 ENCOUNTER — Other Ambulatory Visit (INDEPENDENT_AMBULATORY_CARE_PROVIDER_SITE_OTHER): Payer: BC Managed Care – PPO

## 2013-04-17 ENCOUNTER — Other Ambulatory Visit: Payer: BC Managed Care – PPO | Admitting: Family Medicine

## 2013-04-17 VITALS — BP 120/80 | HR 76 | Temp 98.2°F | Wt 184.0 lb

## 2013-04-17 DIAGNOSIS — E119 Type 2 diabetes mellitus without complications: Secondary | ICD-10-CM

## 2013-04-17 DIAGNOSIS — I10 Essential (primary) hypertension: Secondary | ICD-10-CM

## 2013-04-17 DIAGNOSIS — F17200 Nicotine dependence, unspecified, uncomplicated: Secondary | ICD-10-CM

## 2013-04-17 LAB — BASIC METABOLIC PANEL
BUN: 22 mg/dL (ref 6–23)
Calcium: 9.3 mg/dL (ref 8.4–10.5)
GFR: 116.45 mL/min (ref >60.00)
Potassium: 4.1 mEq/L (ref 3.5–5.1)

## 2013-04-17 MED ORDER — GLIPIZIDE 5 MG PO TABS: ORAL_TABLET | ORAL | Status: DC

## 2013-04-17 MED ORDER — VARENICLINE TARTRATE 1 MG PO TABS: ORAL_TABLET | ORAL | Status: DC

## 2013-04-17 NOTE — Progress Notes (Signed)
  Subjective:    Patient ID: Tyler Wolf, male    DOB: 1956-05-03, 57 y.o.   MRN: [id number]  HPI Tyler Wolf  is a 57 year old male X. Smoker times one week!!!!!!!!!! Who comes in today for followup of hypertension diabetes and tobacco abuse  He's finally stopped smoking and he quit a week ago. He did on his own without any medication.  His blood pressure is 120/80 on Tenoretic and 5 mg of lisinopril.  His blood sugar is in the 120 to 1:30 range at home we get 153 with an A1c of 8.3%.  His weight is steady at 184 he works hard and therefore exercises on a regular basis at work. He is trying to follow a diet????????? And takes his metformin 1000 mg twice a day.   Review of Systems Review of systems otherwise negative    Objective:   Physical Exam  Well-developed well-nourished male no acute distress vital signs stable he is afebrile BP 120/80 weight 184      Assessment & Plan:  Diabetes type 2 not at goal add glipizide 5 mg every morning continue metformin 1000 mg twice a day followup in 4 weeks  Hypertension at goal continue current therapy  X. Smoker times one week prescription for Chantix so that he decides to start smoking I want him to take the Chantix and not by any cigarettes

## 2013-04-17 NOTE — Patient Instructions (Signed)
Is continue the metformin 1000 mg tabs,,,,,,,,,,,,,, 1 before breakfast and 1 before your evening meal  Glipizide 5 mg.......... One tablet before breakfast  Check a fasting blood sugar daily in the morning  Return in 4 weeks for followup.........Marland Kitchenbring a copy of all your blood sugar readings to next appointment  Chantix 1 mg........... One half tab every morning x2 months if you need help not smoking

## 2013-04-24 ENCOUNTER — Ambulatory Visit: Payer: BC Managed Care – PPO | Admitting: Family Medicine

## 2013-05-12 ENCOUNTER — Encounter: Payer: Self-pay | Admitting: Family Medicine

## 2013-05-15 ENCOUNTER — Ambulatory Visit: Payer: BC Managed Care – PPO | Admitting: Family Medicine

## 2013-08-06 ENCOUNTER — Other Ambulatory Visit (INDEPENDENT_AMBULATORY_CARE_PROVIDER_SITE_OTHER): Payer: BC Managed Care – PPO

## 2013-08-06 DIAGNOSIS — Z0000 Encounter for general adult medical examination without abnormal findings: Secondary | ICD-10-CM

## 2013-08-06 DIAGNOSIS — E119 Type 2 diabetes mellitus without complications: Secondary | ICD-10-CM

## 2013-08-06 LAB — CBC WITH DIFFERENTIAL/PLATELET
Basophils Absolute: 0 10*3/uL (ref 0.0–0.1)
Basophils Relative: 0.4 % (ref 0.0–3.0)
Eosinophils Relative: 2 % (ref 0.0–5.0)
Hemoglobin: 15.3 g/dL (ref 13.0–17.0)
Lymphocytes Relative: 27 % (ref 12.0–46.0)
MCHC: 34.3 g/dL (ref 30.0–36.0)
Monocytes Relative: 8.9 % (ref 3.0–12.0)
Neutro Abs: 5.3 10*3/uL (ref 1.4–7.7)
Neutrophils Relative %: 61.7 % (ref 43.0–77.0)
RBC: 4.91 Mil/uL (ref 4.22–5.81)
WBC: 8.6 10*3/uL (ref 4.5–10.5)

## 2013-08-06 LAB — BASIC METABOLIC PANEL
CO2: 28 mEq/L (ref 19–32)
Calcium: 9.4 mg/dL (ref 8.4–10.5)
Creatinine, Ser: 0.7 mg/dL (ref 0.4–1.5)
GFR: 116.32 mL/min (ref >60.00)
Glucose, Bld: 84 mg/dL (ref 70–99)
Sodium: 139 mEq/L (ref 135–145)

## 2013-08-06 LAB — HEPATIC FUNCTION PANEL
ALT: 38 U/L (ref 0–53)
AST: 27 U/L (ref 0–37)
Albumin: 4.3 g/dL (ref 3.5–5.2)
Alkaline Phosphatase: 58 U/L (ref 39–117)
Total Protein: 7.1 g/dL (ref 6.0–8.3)

## 2013-08-06 LAB — LIPID PANEL
Cholesterol: 181 mg/dL (ref 0–200)
HDL: 36.2 mg/dL — ABNORMAL LOW (ref >39.00)
LDL Cholesterol: 106 mg/dL — ABNORMAL HIGH (ref 0–99)
Triglycerides: 193 mg/dL — ABNORMAL HIGH (ref 0.0–149.0)
VLDL: 38.6 mg/dL (ref 0.0–40.0)

## 2013-08-06 LAB — POCT URINALYSIS DIPSTICK
Bilirubin, UA: NEGATIVE
Ketones, UA: NEGATIVE
Protein, UA: NEGATIVE
Spec Grav, UA: 1.02
pH, UA: 7

## 2013-08-06 LAB — MICROALBUMIN / CREATININE URINE RATIO: Microalb Creat Ratio: 0.3 mg/g (ref 0.0–30.0)

## 2013-08-13 ENCOUNTER — Encounter: Payer: Self-pay | Admitting: Family Medicine

## 2013-08-13 ENCOUNTER — Ambulatory Visit (INDEPENDENT_AMBULATORY_CARE_PROVIDER_SITE_OTHER): Payer: BC Managed Care – PPO | Admitting: Family Medicine

## 2013-08-13 VITALS — BP 120/80 | Temp 98.1°F | Ht 68.0 in | Wt 183.0 lb

## 2013-08-13 DIAGNOSIS — E782 Mixed hyperlipidemia: Secondary | ICD-10-CM

## 2013-08-13 DIAGNOSIS — Z23 Encounter for immunization: Secondary | ICD-10-CM

## 2013-08-13 DIAGNOSIS — I10 Essential (primary) hypertension: Secondary | ICD-10-CM

## 2013-08-13 DIAGNOSIS — F17200 Nicotine dependence, unspecified, uncomplicated: Secondary | ICD-10-CM

## 2013-08-13 DIAGNOSIS — N529 Male erectile dysfunction, unspecified: Secondary | ICD-10-CM

## 2013-08-13 DIAGNOSIS — E119 Type 2 diabetes mellitus without complications: Secondary | ICD-10-CM

## 2013-08-13 MED ORDER — ATORVASTATIN CALCIUM 80 MG PO TABS: 80.0000 mg | ORAL_TABLET | Freq: Every day | ORAL | Status: DC

## 2013-08-13 MED ORDER — VARENICLINE TARTRATE 1 MG PO TABS: ORAL_TABLET | ORAL | Status: DC

## 2013-08-13 MED ORDER — ATENOLOL-CHLORTHALIDONE 50-25 MG PO TABS: 1.0000 | ORAL_TABLET | Freq: Every day | ORAL | Status: DC

## 2013-08-13 MED ORDER — METFORMIN HCL 1000 MG PO TABS: ORAL_TABLET | ORAL | Status: DC

## 2013-08-13 MED ORDER — LISINOPRIL 5 MG PO TABS: 5.0000 mg | ORAL_TABLET | Freq: Every day | ORAL | Status: DC

## 2013-08-13 MED ORDER — SILDENAFIL CITRATE 100 MG PO TABS: 100.0000 mg | ORAL_TABLET | Freq: Every day | ORAL | Status: DC | PRN

## 2013-08-13 MED ORDER — GLIPIZIDE 5 MG PO TABS: ORAL_TABLET | ORAL | Status: DC

## 2013-08-13 NOTE — Progress Notes (Signed)
  Subjective:    Patient ID: Tyler Wolf, male    DOB: Aug 13, 1956, 57 y.o.   MRN: [id number]  HPI  Tyler Wolf is a 57 year old married male smoker who comes in today for annual physical examination  He has a history of diabetes type 2 is currently on metformin 1000 mg twice a day and glipizide 5 mg daily  He also takes Lipitor 80 mg and an aspirin tablet for hyperlipidemia  Tenoretic 50-25 and lisinopril 5 mg a day for blood pressure control. BP 120/80 at goal  He uses Viagra when necessary for ED.   We have been working with him in the past on a smoking cessation program with Chantix   He still has the prescription and he is up to smoking about a pack of cigarettes a day. We again explained a positive correlation between smoking diabetes heart attack sudden death and stroke  Review of Systems  Constitutional: Negative.   HENT: Negative.   Eyes: Negative.   Respiratory: Negative.   Cardiovascular: Negative.   Gastrointestinal: Negative.   Endocrine: Negative.   Genitourinary: Negative.   Musculoskeletal: Negative.   Skin: Negative.   Allergic/Immunologic: Negative.   Neurological: Negative.   Hematological: Negative.   Psychiatric/Behavioral: Negative.        Objective:   Physical Exam  Nursing note and vitals reviewed. Constitutional: He is oriented to person, place, and time. He appears well-developed and well-nourished.  HENT:  Head: Normocephalic and atraumatic.  Right Ear: External ear normal.  Left Ear: External ear normal.  Nose: Nose normal.  Mouth/Throat: Oropharynx is clear and moist.  Eyes: Conjunctivae and EOM are normal. Pupils are equal, round, and reactive to light.  Neck: Normal range of motion. Neck supple. No JVD present. No tracheal deviation present. No thyromegaly present.  Cardiovascular: Normal rate, regular rhythm, normal heart sounds and intact distal pulses.  Exam reveals no gallop and no friction rub.   No murmur heard. No carotid or icterus  peripheral pulses 1+ and symmetrical  Pulmonary/Chest: Effort normal and breath sounds normal. No stridor. No respiratory distress. He has no wheezes. He has no rales. He exhibits no tenderness.  Abdominal: Soft. Bowel sounds are normal. He exhibits no distension and no mass. There is no tenderness. There is no rebound and no guarding.  Genitourinary: Rectum normal, prostate normal and penis normal. Guaiac negative stool. No penile tenderness.  Musculoskeletal: Normal range of motion. He exhibits no edema and no tenderness.  Lymphadenopathy:    He has no cervical adenopathy.  Neurological: He is alert and oriented to person, place, and time. He has normal reflexes. No cranial nerve deficit. He exhibits normal muscle tone.  Skin: Skin is warm and dry. No rash noted. No erythema. No pallor.  Psychiatric: He has a normal mood and affect. His behavior is normal. Judgment and thought content normal.          Assessment & Plan:  Diabetes type 2 not at goal increase glipizide focus on diet and exercise followup A1c in 3 months  Tobacco abuse begin Chantix one half tab daily taper program of the cigarettes followup in one month  Hyperlipidemia continue Lipitor and aspirin  Hypertension continue lisinopril and beta blocker  Erectile dysfunction continue Viagra

## 2013-08-13 NOTE — Patient Instructions (Signed)
Begin the Chantix 1 mg.......... one half tab in the morning  Begin tapering by 2 per week or quickly if you so desire  Followup with me in 4 weeks  Increase the glipizide to 5 mg twice a day  Continue the metformin one twice a day  Remember to take the glipizide and metformin one of each before breakfast and one of each before your evening meal

## 2013-09-07 ENCOUNTER — Other Ambulatory Visit: Payer: Self-pay | Admitting: Family Medicine

## 2013-09-25 ENCOUNTER — Encounter: Payer: Self-pay | Admitting: Family Medicine

## 2013-09-25 ENCOUNTER — Ambulatory Visit (INDEPENDENT_AMBULATORY_CARE_PROVIDER_SITE_OTHER): Payer: BC Managed Care – PPO | Admitting: Family Medicine

## 2013-09-25 VITALS — BP 120/84 | Temp 98.3°F | Wt 189.0 lb

## 2013-09-25 DIAGNOSIS — F17200 Nicotine dependence, unspecified, uncomplicated: Secondary | ICD-10-CM

## 2013-09-25 DIAGNOSIS — I10 Essential (primary) hypertension: Secondary | ICD-10-CM

## 2013-09-25 DIAGNOSIS — N529 Male erectile dysfunction, unspecified: Secondary | ICD-10-CM

## 2013-09-25 DIAGNOSIS — E119 Type 2 diabetes mellitus without complications: Secondary | ICD-10-CM

## 2013-09-25 MED ORDER — SILDENAFIL CITRATE 100 MG PO TABS: 100.0000 mg | ORAL_TABLET | Freq: Every day | ORAL | Status: DC | PRN

## 2013-09-25 NOTE — Progress Notes (Signed)
   Subjective:    Patient ID: Tyler Wolf, male    DOB: Oct 24, 1955, 57 y.o.   MRN: [id number]  HPI Tyler Wolf is a 57 year old married male smoker,,,,,,,,, he has not purchased the Chantix yet because of cost,,,,,, who comes in today for followup of diabetes  His A1c a month ago was up to 8. His blood sugars now down to 120 range on glipizide 5 mg twice a day and metformin 1000 mg twice a day  BP normal on Tenoretic and lisinopril  He has not purchased the Viagra because of cost  And he said purchased the Chantix because of the cost   Review of Systems    review of systems otherwise negative Objective:   Physical Exam  Well-developed well-nourished male no acute distress vital signs stable he is afebrile      Assessment & Plan:  Diabetes type 2 improved control check A1c in 2 months  Hypertension at goal continue current therapy  Erectile dysfunction refill Viagra  Tobacco abuse again encouraged to start the Chantix program

## 2013-09-25 NOTE — Patient Instructions (Addendum)
Continue your current medications  Start the samples of Chantix as outlined and taper cigarettes as outlined  [email or web address]  Followup in 2 months  A1c one week prior

## 2013-09-25 NOTE — Progress Notes (Signed)
Pre visit review using our clinic review tool, if applicable. No additional management support is needed unless otherwise documented below in the visit note. 

## 2013-11-26 ENCOUNTER — Other Ambulatory Visit (INDEPENDENT_AMBULATORY_CARE_PROVIDER_SITE_OTHER): Payer: BC Managed Care – PPO

## 2013-11-26 DIAGNOSIS — E119 Type 2 diabetes mellitus without complications: Secondary | ICD-10-CM

## 2013-11-26 LAB — BASIC METABOLIC PANEL
BUN: 18 mg/dL (ref 6–23)
CO2: 27 mEq/L (ref 19–32)
CREATININE: 1 mg/dL (ref 0.4–1.5)
Calcium: 9.5 mg/dL (ref 8.4–10.5)
Chloride: 100 mEq/L (ref 96–112)
GFR: 84.02 mL/min (ref >60.00)
Glucose, Bld: 203 mg/dL — ABNORMAL HIGH (ref 70–99)
Potassium: 5 mEq/L (ref 3.5–5.1)
Sodium: 142 mEq/L (ref 135–145)

## 2013-11-26 LAB — HEMOGLOBIN A1C: Hgb A1c MFr Bld: 8.1 % — ABNORMAL HIGH (ref 4.6–6.5)

## 2013-12-01 ENCOUNTER — Ambulatory Visit (INDEPENDENT_AMBULATORY_CARE_PROVIDER_SITE_OTHER): Payer: BC Managed Care – PPO | Admitting: Family Medicine

## 2013-12-01 ENCOUNTER — Encounter: Payer: Self-pay | Admitting: Family Medicine

## 2013-12-01 VITALS — BP 150/90 | Temp 98.0°F | Wt 192.0 lb

## 2013-12-01 DIAGNOSIS — E782 Mixed hyperlipidemia: Secondary | ICD-10-CM

## 2013-12-01 DIAGNOSIS — E119 Type 2 diabetes mellitus without complications: Secondary | ICD-10-CM

## 2013-12-01 DIAGNOSIS — N529 Male erectile dysfunction, unspecified: Secondary | ICD-10-CM

## 2013-12-01 DIAGNOSIS — I10 Essential (primary) hypertension: Secondary | ICD-10-CM

## 2013-12-01 DIAGNOSIS — F17200 Nicotine dependence, unspecified, uncomplicated: Secondary | ICD-10-CM

## 2013-12-01 MED ORDER — GLIPIZIDE 10 MG PO TABS: 10.0000 mg | ORAL_TABLET | Freq: Two times a day (BID) | ORAL | Status: DC

## 2013-12-01 MED ORDER — SILDENAFIL CITRATE 100 MG PO TABS: 100.0000 mg | ORAL_TABLET | Freq: Every day | ORAL | Status: DC | PRN

## 2013-12-01 NOTE — Addendum Note (Signed)
Addended by: Westley Hummer B on: 12/01/2013 09:50 AM   Modules accepted: Orders

## 2013-12-01 NOTE — Patient Instructions (Signed)
Increase the glipizide to 10 mg twice daily  Continue metformin 1000 mg twice daily  Check a blood pressure and blood sugar  Monday Wednesday Friday first thing in the morning  Return in 3 months for followup  When you return bring a record of all your blood pressure readings and blood sugar readings  Continue the Chantix one half tab daily........... taper by one cigarette per week

## 2013-12-01 NOTE — Progress Notes (Signed)
   Subjective:    Patient ID: Tyler Wolf, male    DOB: 1956-06-12, 58 y.o.   MRN: [id number]  HPI [name] is a 58 year old married male smoker.... down to 8 cigarettes a day on the Chantix program one half tab daily......... who comes in today for followup of hypertension diabetes and tobacco abuse  His blood sugar is still elevated 203 with an A1c of 8.1% on glipizide 5 twice a day and metformin 1000 mg twice a day. We will discuss medication changes today  BP 150/90. He's not monitoring his blood pressure at home. When asked him to do a blood pressure check 3 times weekly. Blood pressure goal 135 or 85 or less  He's on Chantix 1 mg dose one half tab daily and has cut his cigarette consumption down to 8 cigarettes a day   Review of Systems Review of systems otherwise negative    Objective:   Physical Exam  Well-developed well-nourished male no acute distress........ smells of tobacco....... BP 150/90 right arm sitting position      Assessment & Plan:  Diabetes not at goal increase glipizide to 10 mg twice a day followup A1c in 3 months  Hypertension question ago BP check 3 times weekly followup in 3 months  Tobacco abuse continue Chantix one half tab daily taper over 8 weeks

## 2013-12-01 NOTE — Progress Notes (Signed)
Pre visit review using our clinic review tool, if applicable. No additional management support is needed unless otherwise documented below in the visit note. 

## 2013-12-03 ENCOUNTER — Telehealth: Payer: Self-pay | Admitting: Family Medicine

## 2013-12-03 NOTE — Telephone Encounter (Signed)
Relevant patient education mailed to patient.  

## 2014-02-04 ENCOUNTER — Ambulatory Visit: Payer: BC Managed Care – PPO | Admitting: Family Medicine

## 2014-05-16 ENCOUNTER — Other Ambulatory Visit: Payer: Self-pay | Admitting: Family Medicine

## 2014-05-21 ENCOUNTER — Encounter: Payer: Self-pay | Admitting: Family Medicine

## 2014-05-21 LAB — HM DIABETES EYE EXAM

## 2014-05-26 ENCOUNTER — Encounter: Payer: Self-pay | Admitting: Family Medicine

## 2014-05-26 ENCOUNTER — Ambulatory Visit (INDEPENDENT_AMBULATORY_CARE_PROVIDER_SITE_OTHER): Payer: BC Managed Care – PPO | Admitting: Family Medicine

## 2014-05-26 VITALS — BP 120/80 | Temp 98.2°F | Wt 187.0 lb

## 2014-05-26 DIAGNOSIS — E785 Hyperlipidemia, unspecified: Secondary | ICD-10-CM

## 2014-05-26 DIAGNOSIS — E119 Type 2 diabetes mellitus without complications: Secondary | ICD-10-CM

## 2014-05-26 DIAGNOSIS — I10 Essential (primary) hypertension: Secondary | ICD-10-CM

## 2014-05-26 DIAGNOSIS — F17200 Nicotine dependence, unspecified, uncomplicated: Secondary | ICD-10-CM

## 2014-05-26 LAB — BASIC METABOLIC PANEL
BUN: 14 mg/dL (ref 6–23)
CALCIUM: 9.4 mg/dL (ref 8.4–10.5)
CO2: 28 mEq/L (ref 19–32)
Chloride: 102 mEq/L (ref 96–112)
Creatinine, Ser: 0.8 mg/dL (ref 0.4–1.5)
GFR: 107.56 mL/min (ref >60.00)
Glucose, Bld: 85 mg/dL (ref 70–99)
Potassium: 4.3 mEq/L (ref 3.5–5.1)
Sodium: 140 mEq/L (ref 135–145)

## 2014-05-26 LAB — MICROALBUMIN / CREATININE URINE RATIO
Creatinine,U: 127.4 mg/dL
Microalb Creat Ratio: 0.5 mg/g (ref 0.0–30.0)
Microalb, Ur: 0.6 mg/dL (ref 0.0–1.9)

## 2014-05-26 LAB — HEMOGLOBIN A1C: HEMOGLOBIN A1C: 8.5 % — AB (ref 4.6–6.5)

## 2014-05-26 NOTE — Progress Notes (Signed)
Pre visit review using our clinic review tool, if applicable. No additional management support is needed unless otherwise documented below in the visit note. Lab Results  Component Value Date   HGBA1C 8.1* 11/26/2013   HGBA1C 8.1* 08/06/2013   HGBA1C 8.3* 04/17/2013   Lab Results  Component Value Date   MICROALBUR 0.4 08/06/2013   LDLCALC 106* 08/06/2013   CREATININE 1.0 11/26/2013

## 2014-05-26 NOTE — Progress Notes (Signed)
   Subjective:    Patient ID: Tyler Wolf, male    DOB: [birth year], 58 y.o.   MRN: [id number]  HPI Call is a 58 year old married male smoker.........Marland Kitchen 10 a day......... who has underlying hypertension diabetes and hyperlipidemia who comes in today for followup  He's been trying to be more compliant. Last A1c was 8.1%. 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 He takes glipizide 10 mg twice a day metformin 1000 mg twice a day.  We discussed that if his A1c is not at goal he will need to start insulin  His blood pressure is 120/80 on lisinopril 5 mg daily  He also takes Tenoretic  He didn't get the Chantix because of cost. He's cut down to 10 cigarettes a day. LDL is 106 on 80 mg of Lipitor daily   Review of Systems Review of systems otherwise negative    Objective:   Physical Exam  Well-developed and nourished in no acute distress vital signs stable he is afebrile      Assessment & Plan:  Hypertension at goal,,,,,,,,, continue current therapy  Diabetes not at goal,,,,,,,,,,,, check labs,,,,,,,,,, start insulin if A1c elevated above 7.0%  Hyperlipidemia,,,,,,,,,,, continue Lipitor 80,

## 2014-05-26 NOTE — Patient Instructions (Signed)
Continue your current medications  Labs today  If A1c is not at goal........... 7.0%.......... then we'll need to get you back injury how to start insulin one shot daily  Begin the Chantix program one half tab daily......... taper by 2 per week  Continue other medications

## 2014-05-27 ENCOUNTER — Telehealth: Payer: Self-pay | Admitting: Family Medicine

## 2014-05-27 NOTE — Telephone Encounter (Signed)
Relevant patient education mailed to patient.  

## 2014-05-28 ENCOUNTER — Ambulatory Visit (INDEPENDENT_AMBULATORY_CARE_PROVIDER_SITE_OTHER): Payer: BC Managed Care – PPO | Admitting: Family Medicine

## 2014-05-28 ENCOUNTER — Encounter: Payer: Self-pay | Admitting: Family Medicine

## 2014-05-28 DIAGNOSIS — E119 Type 2 diabetes mellitus without complications: Secondary | ICD-10-CM

## 2014-05-28 NOTE — Patient Instructions (Signed)
Insulin 7525,,,,,,,,,,,,,,,,,,,,, 10 units at bedtime starting Friday night  Continue your medications  Walk 30 minutes daily  Check a fasting blood sugar in the morning and a blood sugar prior to your evening meal  Return in one week with a record of all your blood sugar readings,

## 2014-05-28 NOTE — Progress Notes (Signed)
   Subjective:    Patient ID: Tyler Wolf, male    DOB: 1956/01/21, 58 y.o.   MRN: [id number]  HPI Call is a 58 year old married male who comes in today for followup of diabetes type 2  We saw the other day he was on glipizide 10 mg twice a day metformin 1000 mg twice a day and his blood sugar fasting was 85 however his A1c came back 8.5%. He's brought in today for induction of insulin   Review of Systems    review of systems negative Objective:   Physical Exam Well-developed well-nourished in no acute distress vital signs stable he is afebrile       Assessment & Plan:  Diabetes type 1............. now converted...........Marland Kitchen

## 2014-06-04 ENCOUNTER — Encounter: Payer: Self-pay | Admitting: Family Medicine

## 2014-06-04 ENCOUNTER — Ambulatory Visit (INDEPENDENT_AMBULATORY_CARE_PROVIDER_SITE_OTHER): Payer: BC Managed Care – PPO | Admitting: Family Medicine

## 2014-06-04 VITALS — BP 130/80 | Temp 98.1°F | Wt 186.0 lb

## 2014-06-04 DIAGNOSIS — E1165 Type 2 diabetes mellitus with hyperglycemia: Secondary | ICD-10-CM

## 2014-06-04 DIAGNOSIS — IMO0002 Reserved for concepts with insufficient information to code with codable children: Secondary | ICD-10-CM

## 2014-06-04 DIAGNOSIS — E118 Type 2 diabetes mellitus with unspecified complications: Principal | ICD-10-CM

## 2014-06-04 DIAGNOSIS — E119 Type 2 diabetes mellitus without complications: Secondary | ICD-10-CM | POA: Insufficient documentation

## 2014-06-04 NOTE — Progress Notes (Signed)
Pre visit review using our clinic review tool, if applicable. No additional management support is needed unless otherwise documented below in the visit note. Lab Results  Component Value Date   HGBA1C 8.5* 05/26/2014   HGBA1C 8.1* 11/26/2013   HGBA1C 8.1* 08/06/2013   Lab Results  Component Value Date   MICROALBUR 0.6 05/26/2014   LDLCALC 106* 08/06/2013   CREATININE 0.8 05/26/2014

## 2014-06-04 NOTE — Progress Notes (Signed)
   Subjective:    Patient ID: Tyler Wolf, male    DOB: 1956-08-12, 58 y.o.   MRN: [id number]  HPI Tyler Wolf is a 58 year old married male smoker who comes in today for followup of diabetes type 1  We started him on insulin last week because his A1c was over 8%. His fasting blood sugars now are fairly normal. Evening blood sugars prior eating about 1 5160. No hypoglycemia. He continues his oral glipizide and metformin   Review of Systems Review of systems otherwise negative no hypoglycemia    Objective:   Physical Exam  Well-developed well-nourished male no acute distress vital signs stable he is afebrile      Assessment & Plan:  Diabetes type 1............ increase insulin to 15 units daily........Marland Kitchen

## 2014-06-04 NOTE — Patient Instructions (Signed)
Increase your insulin to 15 units daily........... switch and take it in the morning  Check a fasting blood sugar.......... and a blood sugar before your evening male  Return in one week for followup

## 2014-06-11 ENCOUNTER — Encounter: Payer: Self-pay | Admitting: Family Medicine

## 2014-06-11 ENCOUNTER — Ambulatory Visit (INDEPENDENT_AMBULATORY_CARE_PROVIDER_SITE_OTHER): Payer: BC Managed Care – PPO | Admitting: Family Medicine

## 2014-06-11 VITALS — BP 120/80 | Temp 98.0°F | Wt 187.0 lb

## 2014-06-11 DIAGNOSIS — Z23 Encounter for immunization: Secondary | ICD-10-CM

## 2014-06-11 DIAGNOSIS — E118 Type 2 diabetes mellitus with unspecified complications: Secondary | ICD-10-CM

## 2014-06-11 DIAGNOSIS — E1165 Type 2 diabetes mellitus with hyperglycemia: Secondary | ICD-10-CM

## 2014-06-11 DIAGNOSIS — E782 Mixed hyperlipidemia: Secondary | ICD-10-CM

## 2014-06-11 DIAGNOSIS — IMO0002 Reserved for concepts with insufficient information to code with codable children: Secondary | ICD-10-CM

## 2014-06-11 MED ORDER — ATORVASTATIN CALCIUM 80 MG PO TABS: 80.0000 mg | ORAL_TABLET | Freq: Every day | ORAL | Status: DC

## 2014-06-11 NOTE — Progress Notes (Signed)
   Subjective:    Patient ID: DALE RIBEIRO, male    DOB: 10-Jun-1956, 58 y.o.   MRN: [id number]  HPI Esther is a 58 year old male who comes in today for followup of diabetes  He was on oral medicines and he maxed out. We started him insulin 75/25 doses 15 units in the morning with breakfast now his blood sugar drawn the 80-110 range  No hypoglycemia   Review of Systems Review of systems otherwise negative    Objective:   Physical Exam  Well-developed well-nourished male no acute distress vital signs stable he is afebrile BP today 120/80 at goal      Assessment & Plan:  Diabetes type 1........... improved control now that he is on insulin........ continue 15 units daily monitor blood sugars outlined return in one month.

## 2014-06-11 NOTE — Progress Notes (Signed)
Pre visit review using our clinic review tool, if applicable. No additional management support is needed unless otherwise documented below in the visit note. Lab Results  Component Value Date   HGBA1C 8.5* 05/26/2014   HGBA1C 8.1* 11/26/2013   HGBA1C 8.1* 08/06/2013   Lab Results  Component Value Date   MICROALBUR 0.6 05/26/2014   LDLCALC 106* 08/06/2013   CREATININE 0.8 05/26/2014

## 2014-06-11 NOTE — Patient Instructions (Signed)
Check a fasting blood sugar daily in the morning  Continue your current medication  If you have any questions about your insulin............ call and leave a voicemail with Tyler Wolf  Return in one month for followup sooner if any problems

## 2014-06-23 ENCOUNTER — Telehealth: Payer: Self-pay | Admitting: Family Medicine

## 2014-06-23 NOTE — Telephone Encounter (Signed)
Pt called he need a rx for syringes    Pharmacy ; [address]

## 2014-06-24 MED ORDER — "INSULIN SYRINGE 31G X 5/16"" 1 ML MISC": 1.0000 | Freq: Every day | Status: DC

## 2014-06-24 NOTE — Telephone Encounter (Signed)
rx sent

## 2014-07-13 ENCOUNTER — Ambulatory Visit (INDEPENDENT_AMBULATORY_CARE_PROVIDER_SITE_OTHER): Payer: BC Managed Care – PPO | Admitting: Family Medicine

## 2014-07-13 ENCOUNTER — Encounter: Payer: Self-pay | Admitting: Family Medicine

## 2014-07-13 VITALS — BP 110/80 | Temp 98.0°F | Wt 187.0 lb

## 2014-07-13 DIAGNOSIS — F17200 Nicotine dependence, unspecified, uncomplicated: Secondary | ICD-10-CM

## 2014-07-13 DIAGNOSIS — E1165 Type 2 diabetes mellitus with hyperglycemia: Secondary | ICD-10-CM

## 2014-07-13 DIAGNOSIS — E118 Type 2 diabetes mellitus with unspecified complications: Principal | ICD-10-CM

## 2014-07-13 DIAGNOSIS — IMO0002 Reserved for concepts with insufficient information to code with codable children: Secondary | ICD-10-CM

## 2014-07-13 NOTE — Progress Notes (Signed)
Pre visit review using our clinic review tool, if applicable. No additional management support is needed unless otherwise documented below in the visit note. Lab Results  Component Value Date   HGBA1C 8.5* 05/26/2014   HGBA1C 8.1* 11/26/2013   HGBA1C 8.1* 08/06/2013   Lab Results  Component Value Date   MICROALBUR 0.6 05/26/2014   LDLCALC 106* 08/06/2013   CREATININE 0.8 05/26/2014

## 2014-07-13 NOTE — Progress Notes (Signed)
   Subjective:    Patient ID: Tyler Wolf, male    DOB: September 08, 1956, 58 y.o.   MRN: [id number]  HPI Tyler Wolf is a 58 year old male who comes in today for followup of diabetes type 1  To transition from oral medications to oral medications plus insulin last year. His A1c 7 months ago was 8.1%. Now his A1c is 8.5%.  Asymptomatic......... no hypoglycemia  He's taking the Chantix one half tab daily he still smoking 8 cigarettes a day   Review of Systems    review of systems otherwise negative Objective:   Physical Exam Well-developed well-nourished male in no acute distress no signs stable he is afebrile BP 110/80       Assessment & Plan:   diabetes type 1 not at goal............ increase morning insulin to 25 units, continue oral meds, followup in 2  months  Chantix,,,,,,,,,,,,, one half tab twice daily,,,,,,,,, starting tomorrow begin tapering by one cigarette per ,,,,,,,,,,,,,,,,,, followup in 2 months

## 2014-07-13 NOTE — Patient Instructions (Signed)
Increase the Chantix,,,,,,,,,,, one half tab twice daily  Increase your insulin,,,,,,,,,,,, 25 units daily in the morning  Continue your medications  Followup in 2 months  Labs one week prior,,,,,,,,,, 11:30 in the morning,,,,, or 4:30 in the afternoon,,,,,,,,,, not fasting

## 2014-07-30 ENCOUNTER — Other Ambulatory Visit: Payer: Self-pay | Admitting: Family Medicine

## 2014-08-10 ENCOUNTER — Other Ambulatory Visit (INDEPENDENT_AMBULATORY_CARE_PROVIDER_SITE_OTHER): Payer: BC Managed Care – PPO

## 2014-08-10 DIAGNOSIS — Z0000 Encounter for general adult medical examination without abnormal findings: Secondary | ICD-10-CM

## 2014-08-10 LAB — LIPID PANEL
Cholesterol: 157 mg/dL (ref 0–200)
HDL: 32.5 mg/dL — AB (ref >39.00)
LDL Cholesterol: 92 mg/dL (ref 0–99)
NONHDL: 124.5
Total CHOL/HDL Ratio: 5
Triglycerides: 161 mg/dL — ABNORMAL HIGH (ref 0.0–149.0)
VLDL: 32.2 mg/dL (ref 0.0–40.0)

## 2014-08-10 LAB — POCT URINALYSIS DIPSTICK
BILIRUBIN UA: NEGATIVE
Blood, UA: NEGATIVE
Glucose, UA: NEGATIVE
LEUKOCYTES UA: NEGATIVE
NITRITE UA: NEGATIVE
PH UA: 5.5
PROTEIN UA: NEGATIVE
Spec Grav, UA: 1.02
Urobilinogen, UA: 0.2

## 2014-08-10 LAB — CBC WITH DIFFERENTIAL/PLATELET
BASOS ABS: 0 10*3/uL (ref 0.0–0.1)
Basophils Relative: 0.5 % (ref 0.0–3.0)
EOS ABS: 0.2 10*3/uL (ref 0.0–0.7)
Eosinophils Relative: 2.1 % (ref 0.0–5.0)
HCT: 46.5 % (ref 39.0–52.0)
Hemoglobin: 15.5 g/dL (ref 13.0–17.0)
LYMPHS PCT: 27.5 % (ref 12.0–46.0)
Lymphs Abs: 2.2 10*3/uL (ref 0.7–4.0)
MCHC: 33.3 g/dL (ref 30.0–36.0)
MCV: 93.2 fl (ref 78.0–100.0)
MONOS PCT: 9.2 % (ref 3.0–12.0)
Monocytes Absolute: 0.7 10*3/uL (ref 0.1–1.0)
NEUTROS PCT: 60.7 % (ref 43.0–77.0)
Neutro Abs: 4.9 10*3/uL (ref 1.4–7.7)
Platelets: 238 10*3/uL (ref 150.0–400.0)
RBC: 4.99 Mil/uL (ref 4.22–5.81)
RDW: 13.6 % (ref 11.5–15.5)
WBC: 8.1 10*3/uL (ref 4.0–10.5)

## 2014-08-10 LAB — HEPATIC FUNCTION PANEL
ALT: 42 U/L (ref 0–53)
AST: 26 U/L (ref 0–37)
Albumin: 3.8 g/dL (ref 3.5–5.2)
Alkaline Phosphatase: 67 U/L (ref 39–117)
BILIRUBIN DIRECT: 0.2 mg/dL (ref 0.0–0.3)
BILIRUBIN TOTAL: 0.8 mg/dL (ref 0.2–1.2)
Total Protein: 7.3 g/dL (ref 6.0–8.3)

## 2014-08-10 LAB — MICROALBUMIN / CREATININE URINE RATIO
Creatinine,U: 212.5 mg/dL
MICROALB/CREAT RATIO: 0.3 mg/g (ref 0.0–30.0)
Microalb, Ur: 0.6 mg/dL (ref 0.0–1.9)

## 2014-08-10 LAB — HEMOGLOBIN A1C: Hgb A1c MFr Bld: 7.1 % — ABNORMAL HIGH (ref 4.6–6.5)

## 2014-08-10 LAB — BASIC METABOLIC PANEL
BUN: 15 mg/dL (ref 6–23)
CO2: 31 mEq/L (ref 19–32)
CREATININE: 0.8 mg/dL (ref 0.4–1.5)
Calcium: 9.1 mg/dL (ref 8.4–10.5)
Chloride: 99 mEq/L (ref 96–112)
GFR: 109.07 mL/min (ref >60.00)
GLUCOSE: 104 mg/dL — AB (ref 70–99)
Potassium: 4.3 mEq/L (ref 3.5–5.1)
Sodium: 140 mEq/L (ref 135–145)

## 2014-08-10 LAB — PSA: PSA: 0.48 ng/mL (ref 0.10–4.00)

## 2014-08-10 LAB — TSH: TSH: 2.78 u[IU]/mL (ref 0.35–4.50)

## 2014-08-17 ENCOUNTER — Encounter: Payer: Self-pay | Admitting: Family Medicine

## 2014-08-17 ENCOUNTER — Telehealth: Payer: Self-pay | Admitting: Family Medicine

## 2014-08-17 ENCOUNTER — Ambulatory Visit (INDEPENDENT_AMBULATORY_CARE_PROVIDER_SITE_OTHER): Payer: BC Managed Care – PPO | Admitting: Family Medicine

## 2014-08-17 VITALS — BP 130/90 | Temp 98.3°F | Ht 68.0 in | Wt 187.0 lb

## 2014-08-17 DIAGNOSIS — F17200 Nicotine dependence, unspecified, uncomplicated: Secondary | ICD-10-CM

## 2014-08-17 DIAGNOSIS — I10 Essential (primary) hypertension: Secondary | ICD-10-CM

## 2014-08-17 DIAGNOSIS — N528 Other male erectile dysfunction: Secondary | ICD-10-CM

## 2014-08-17 DIAGNOSIS — E139 Other specified diabetes mellitus without complications: Secondary | ICD-10-CM

## 2014-08-17 DIAGNOSIS — Z720 Tobacco use: Secondary | ICD-10-CM

## 2014-08-17 DIAGNOSIS — N529 Male erectile dysfunction, unspecified: Secondary | ICD-10-CM

## 2014-08-17 DIAGNOSIS — E782 Mixed hyperlipidemia: Secondary | ICD-10-CM

## 2014-08-17 MED ORDER — ATORVASTATIN CALCIUM 80 MG PO TABS: 80.0000 mg | ORAL_TABLET | Freq: Every day | ORAL | Status: DC

## 2014-08-17 MED ORDER — SILDENAFIL CITRATE 100 MG PO TABS: 100.0000 mg | ORAL_TABLET | ORAL | Status: DC | PRN

## 2014-08-17 MED ORDER — VARENICLINE TARTRATE 1 MG PO TABS: ORAL_TABLET | ORAL | Status: DC

## 2014-08-17 MED ORDER — METFORMIN HCL 1000 MG PO TABS: ORAL_TABLET | ORAL | Status: DC

## 2014-08-17 MED ORDER — ATENOLOL-CHLORTHALIDONE 50-25 MG PO TABS: 1.0000 | ORAL_TABLET | Freq: Every day | ORAL | Status: DC

## 2014-08-17 MED ORDER — LISINOPRIL 5 MG PO TABS: ORAL_TABLET | ORAL | Status: DC

## 2014-08-17 MED ORDER — VARENICLINE TARTRATE 1 MG PO TABS: 1.0000 mg | ORAL_TABLET | Freq: Two times a day (BID) | ORAL | Status: DC

## 2014-08-17 MED ORDER — GLIPIZIDE 5 MG PO TABS: ORAL_TABLET | ORAL | Status: DC

## 2014-08-17 MED ORDER — INSULIN LISPRO PROT & LISPRO (75-25 MIX) 100 UNIT/ML ~~LOC~~ SUSP: 15.0000 [IU] | Freq: Every day | SUBCUTANEOUS | Status: DC

## 2014-08-17 NOTE — Telephone Encounter (Signed)
[place] [place] states they need a RX for Started Pack on Chantix and the Continuous Pack directions needs changing to "take 1, twice per day".    Pharmacy states Chantix starter pack is suppose to be used before pt goes on the Continuous Pack.

## 2014-08-17 NOTE — Progress Notes (Signed)
Pre visit review using our clinic review tool, if applicable. No additional management support is needed unless otherwise documented below in the visit note. Lab Results  Component Value Date   HGBA1C 7.1* 08/10/2014   HGBA1C 8.5* 05/26/2014   HGBA1C 8.1* 11/26/2013   Lab Results  Component Value Date   MICROALBUR 0.6 08/10/2014   LDLCALC 92 08/10/2014   CREATININE 0.8 08/10/2014

## 2014-08-17 NOTE — Progress Notes (Signed)
   Subjective:    Patient ID: Tyler Wolf, male    DOB: 1956/05/05, 58 y.o.   MRN: [id number]  HPI Tyler Wolf is a 58 year old married male who has underlying type 1 diabetes insulin-dependent hypertension hyperlipidemia and tobacco abuse.......... On the Chantix program 1 mg daily he's down to 2 cigarettes per day.  He gets routine eye care, dental care, colonoscopy 2 years ago normal, med list reviewed there've been some changes. His glipizide is 5 mg twice a day and is only taking 15 units of insulin once daily in the morning.  Recent blood sugar 104 A1c 7.1%  BP 130/90 at home his blood pressures 120/80.  The Lipitor 80 mg daily LDL cholesterol is 92  Vaccinations updated by Tyler Wolf   Review of Systems  Constitutional: Negative.   HENT: Negative.   Eyes: Negative.   Respiratory: Negative.   Cardiovascular: Negative.   Gastrointestinal: Negative.   Endocrine: Negative.   Genitourinary: Negative.   Musculoskeletal: Negative.   Skin: Negative.   Allergic/Immunologic: Negative.   Neurological: Negative.   Hematological: Negative.   Psychiatric/Behavioral: Negative.        Objective:   Physical Exam  Constitutional: He is oriented to person, place, and time. He appears well-developed and well-nourished.  HENT:  Head: Normocephalic and atraumatic.  Right Ear: External ear normal.  Left Ear: External ear normal.  Nose: Nose normal.  Mouth/Throat: Oropharynx is clear and moist.  Eyes: Conjunctivae and EOM are normal. Pupils are equal, round, and reactive to light.  Neck: Normal range of motion. Neck supple. No JVD present. No tracheal deviation present. No thyromegaly present.  Cardiovascular: Normal rate, regular rhythm, normal heart sounds and intact distal pulses.  Exam reveals no gallop and no friction rub.   No murmur heard. No carotid nor aortic bruits peripheral pulses 2+ and symmetrical no neuropathy  Pulmonary/Chest: Effort normal and breath sounds normal. No  stridor. No respiratory distress. He has no wheezes. He has no rales. He exhibits no tenderness.  Abdominal: Soft. Bowel sounds are normal. He exhibits no distension and no mass. There is no tenderness. There is no rebound and no guarding.  Genitourinary: Rectum normal, prostate normal and penis normal. Guaiac negative stool. No penile tenderness.  Musculoskeletal: Normal range of motion. He exhibits no edema or tenderness.  Lymphadenopathy:    He has no cervical adenopathy.  Neurological: He is alert and oriented to person, place, and time. He has normal reflexes. No cranial nerve deficit. He exhibits normal muscle tone.  Skin: Skin is warm and dry. No rash noted. No erythema. No pallor.  Psychiatric: He has a normal mood and affect. His behavior is normal. Judgment and thought content normal.  Nursing note and vitals reviewed.         Assessment & Plan:  ,diabetes type 1 at goal,,,,,,,,,,, continue current therapy  Hypertension at goal,,,,,,,,: Blood pressure 120/80,,,,,,, continue current therapy  Hyperlipidemia goal,,,,,,,,, continue current therapy  Tobacco abuse,,,,,,,, down to 2 cigarettes daily with the Chantix program,,,,,, continue Chantix for 3 months until he stopped smoking completely  Follow-up in 6 months

## 2014-08-17 NOTE — Patient Instructions (Signed)
Continue your current medications  Continue the Chantix 1 mg daily taper by one per week,,,,,,,,,, quit date in 2 weeks  Follow-up in 6 months for your diabetes  Nonfasting labs one week prior

## 2014-08-17 NOTE — Telephone Encounter (Signed)
Rx sent to pharmacy   

## 2014-08-18 ENCOUNTER — Other Ambulatory Visit: Payer: Self-pay | Admitting: *Deleted

## 2014-08-18 MED ORDER — INSULIN ASPART PROT & ASPART (70-30 MIX) 100 UNIT/ML ~~LOC~~ SUSP: 15.0000 [IU] | Freq: Every day | SUBCUTANEOUS | Status: DC

## 2014-08-18 NOTE — Telephone Encounter (Signed)
Insurance requires a prior autorization for Humalog.  New rx for novolog sent and patient is aware.

## 2014-08-20 ENCOUNTER — Other Ambulatory Visit: Payer: Self-pay | Admitting: *Deleted

## 2014-08-20 MED ORDER — VARENICLINE TARTRATE 0.5 MG PO TABS: 0.5000 mg | ORAL_TABLET | Freq: Two times a day (BID) | ORAL | Status: DC

## 2014-08-25 ENCOUNTER — Other Ambulatory Visit: Payer: Self-pay | Admitting: *Deleted

## 2014-08-25 MED ORDER — VARENICLINE TARTRATE 0.5 MG X 11 & 1 MG X 42 PO MISC: ORAL | Status: DC

## 2014-08-27 ENCOUNTER — Ambulatory Visit (INDEPENDENT_AMBULATORY_CARE_PROVIDER_SITE_OTHER): Payer: BC Managed Care – PPO | Admitting: Family Medicine

## 2014-08-27 ENCOUNTER — Encounter: Payer: Self-pay | Admitting: Family Medicine

## 2014-08-27 VITALS — BP 140/90 | Temp 97.8°F | Wt 185.0 lb

## 2014-08-27 DIAGNOSIS — J4521 Mild intermittent asthma with (acute) exacerbation: Secondary | ICD-10-CM

## 2014-08-27 DIAGNOSIS — J45909 Unspecified asthma, uncomplicated: Secondary | ICD-10-CM | POA: Insufficient documentation

## 2014-08-27 MED ORDER — HYDROCODONE-HOMATROPINE 5-1.5 MG/5ML PO SYRP: 5.0000 mL | ORAL_SOLUTION | Freq: Three times a day (TID) | ORAL | Status: DC | PRN

## 2014-08-27 MED ORDER — PREDNISONE 20 MG PO TABS: ORAL_TABLET | ORAL | Status: DC

## 2014-08-27 NOTE — Progress Notes (Signed)
   Subjective:    Patient ID: Tyler Wolf, male    DOB: 1956/04/15, 58 y.o.   MRN: [id number]  HPI Tyler Wolf is a 58 year old married male...Marland KitchenMarland KitchenMarland Kitchen X smoker 1 week..... On Chantix  1 tab daily,,,,,, who comes in today for a cough for 1 week  He developed a viral syndrome a week ago and now the cough is severe. No fever no sputum production. He feels like he is wheezing   Review of Systems    review of systems otherwise negative Objective:   Physical Exam Well-developed well-nourished male no acute distress vital signs stable is afebrile inspiratory rate 12 and unlabored it  HEENT were negative neck was supple no adenopathy lungs are clear except for some mild to moderate late expiratory wheezing on forced expiration       Assessment & Plan:  Viral syndrome with secondary wheezing in this setting of underlying many years of smoking........ Continued not to smoke...... Hydromet......... Burst and taper prednisone

## 2014-08-27 NOTE — Patient Instructions (Signed)
No smoking............. Good job!!!!!!!!!!!!!!!  Drink lots of water  Hydromet 1/2-1 teaspoon 3 times daily  Prednisone 20 mg............, take 2 tablets now,,,,,,, starting tomorrow take 2 tablets every morning for 3 days then taper as outlined  Return when necessary

## 2014-08-27 NOTE — Progress Notes (Signed)
Pre visit review using our clinic review tool, if applicable. No additional management support is needed unless otherwise documented below in the visit note. 

## 2014-10-05 ENCOUNTER — Other Ambulatory Visit: Payer: Self-pay | Admitting: Family Medicine

## 2014-10-12 ENCOUNTER — Other Ambulatory Visit: Payer: Self-pay | Admitting: Family Medicine

## 2015-04-22 ENCOUNTER — Other Ambulatory Visit: Payer: Self-pay | Admitting: Family Medicine

## 2015-04-22 DIAGNOSIS — E119 Type 2 diabetes mellitus without complications: Secondary | ICD-10-CM

## 2015-04-25 ENCOUNTER — Other Ambulatory Visit: Payer: Self-pay | Admitting: Family Medicine

## 2015-04-29 ENCOUNTER — Other Ambulatory Visit (INDEPENDENT_AMBULATORY_CARE_PROVIDER_SITE_OTHER): Payer: BLUE CROSS/BLUE SHIELD

## 2015-04-29 DIAGNOSIS — E119 Type 2 diabetes mellitus without complications: Secondary | ICD-10-CM

## 2015-04-29 LAB — BASIC METABOLIC PANEL
BUN: 17 mg/dL (ref 6–23)
CALCIUM: 9.5 mg/dL (ref 8.4–10.5)
CO2: 31 meq/L (ref 19–32)
Chloride: 103 mEq/L (ref 96–112)
Creatinine, Ser: 0.74 mg/dL (ref 0.40–1.50)
GFR: 115.6 mL/min (ref >60.00)
GLUCOSE: 91 mg/dL (ref 70–99)
Potassium: 4.2 mEq/L (ref 3.5–5.1)
Sodium: 141 mEq/L (ref 135–145)

## 2015-04-29 LAB — MICROALBUMIN / CREATININE URINE RATIO
CREATININE, U: 106 mg/dL
MICROALB/CREAT RATIO: 0.7 mg/g (ref 0.0–30.0)
Microalb, Ur: <0.7 mg/dL (ref 0.0–1.9)

## 2015-04-29 LAB — HEMOGLOBIN A1C: HEMOGLOBIN A1C: 7.7 % — AB (ref 4.6–6.5)

## 2015-06-01 ENCOUNTER — Other Ambulatory Visit: Payer: Self-pay | Admitting: *Deleted

## 2015-06-01 MED ORDER — GLIPIZIDE 5 MG PO TABS: ORAL_TABLET | ORAL | Status: DC

## 2015-06-09 LAB — HM DIABETES EYE EXAM

## 2015-06-14 ENCOUNTER — Encounter: Payer: Self-pay | Admitting: Family Medicine

## 2015-07-06 ENCOUNTER — Other Ambulatory Visit: Payer: Self-pay | Admitting: Family Medicine

## 2015-07-18 ENCOUNTER — Other Ambulatory Visit: Payer: Self-pay | Admitting: Family Medicine

## 2015-07-30 ENCOUNTER — Other Ambulatory Visit: Payer: Self-pay | Admitting: Family Medicine

## 2015-10-16 ENCOUNTER — Other Ambulatory Visit: Payer: Self-pay | Admitting: Family Medicine

## 2016-03-01 ENCOUNTER — Ambulatory Visit: Payer: BLUE CROSS/BLUE SHIELD | Admitting: Family Medicine

## 2016-03-01 ENCOUNTER — Ambulatory Visit (INDEPENDENT_AMBULATORY_CARE_PROVIDER_SITE_OTHER): Payer: BLUE CROSS/BLUE SHIELD | Admitting: Adult Health

## 2016-03-01 ENCOUNTER — Encounter: Payer: Self-pay | Admitting: Adult Health

## 2016-03-01 VITALS — BP 130/80 | Temp 98.5°F | Ht 68.0 in | Wt 180.5 lb

## 2016-03-01 DIAGNOSIS — M25562 Pain in left knee: Secondary | ICD-10-CM

## 2016-03-01 MED ORDER — IBUPROFEN 800 MG PO TABS: 800.0000 mg | ORAL_TABLET | Freq: Three times a day (TID) | ORAL | Status: DC | PRN

## 2016-03-01 NOTE — Patient Instructions (Addendum)
It was great meeting you today today   It appears as though you have tendonitis. I have sent in a prescription for Ibuprofen. Take one pill every 6-8 hours for the next three days. Continue to ice the area as much as possible.   Follow up if no improvement in the next 1-2 weeks.   Tendinitis Tendinitis is swelling and inflammation of the tendons. Tendons are band-like tissues that connect muscle to bone. Tendinitis commonly occurs in the:   Shoulders (rotator cuff).  Heels (Achilles tendon).  Elbows (triceps tendon). CAUSES Tendinitis is usually caused by overusing the tendon, muscles, and joints involved. When the tissue surrounding a tendon (synovium) becomes inflamed, it is called tenosynovitis. Tendinitis commonly develops in people whose jobs require repetitive motions. SYMPTOMS  Pain.  Tenderness.  Mild swelling. DIAGNOSIS Tendinitis is usually diagnosed by physical exam. Your health care provider may also order X-rays or other imaging tests. TREATMENT Your health care provider may recommend certain medicines or exercises for your treatment. HOME CARE INSTRUCTIONS   Use a sling or splint for as long as directed by your health care provider until the pain decreases.  Put ice on the injured area.  Put ice in a plastic bag.  Place a towel between your skin and the bag.  Leave the ice on for 15-20 minutes, 3-4 times a day, or as directed by your health care provider.  Avoid using the limb while the tendon is painful. Perform gentle range of motion exercises only as directed by your health care provider. Stop exercises if pain or discomfort increase, unless directed otherwise by your health care provider.  Only take over-the-counter or prescription medicines for pain, discomfort, or fever as directed by your health care provider. SEEK MEDICAL CARE IF:   Your pain and swelling increase.  You develop new, unexplained symptoms, especially increased numbness in the  hands. MAKE SURE YOU:   Understand these instructions.  Will watch your condition.  Will get help right away if you are not doing well or get worse.   This information is not intended to replace advice given to you by your health care provider. Make sure you discuss any questions you have with your health care provider.   Document Released: 09/29/2000 Document Revised: 10/23/2014 Document Reviewed: 12/19/2010 Elsevier Interactive Patient Education [employer].

## 2016-03-01 NOTE — Progress Notes (Signed)
Subjective:    Patient ID: Tyler Wolf, male    DOB: October 17, 1955, 60 y.o.   MRN: [id number]  HPI  60 year old male, patient of Dr. Sherren Wolf who I am seeing in his abcense. Tyler Wolf reports that he has had left sided knee pain x 5 days. He denies any trauma. The pain is felt as a dull pain and it comes and goes. 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   Denies any clicking or popping. Has not seen any swelling or bruising.   He has been using Aleve and a knee brace.   Review of Systems  Constitutional: Negative.   Musculoskeletal: Positive for arthralgias. Negative for joint swelling and gait problem.  Skin: Negative.   All other systems reviewed and are negative.  Past Medical History  Diagnosis Date  . DIABETES MELLITUS, TYPE II 05/17/2007  . DIVERTICULITIS, ACUTE 08/17/2010  . 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 HYPERLIPIDEMIA, MIXED 05/27/2007  . HYPERTENSION 05/17/2007  . TOBACCO ABUSE 05/27/2007    Social History   Social History  . Marital Status: Married    Spouse Name: N/A  . Number of Children: N/A  . Years of Education: N/A   Occupational History  . Not on file.   Social History Main Topics  . Smoking status: Former Smoker -- 0.50 packs/day  . Smokeless tobacco: Not on file     Comment: electronic cigarettes  . Alcohol Use: No  . Drug Use: No  . Sexual Activity: Not on file   Other Topics Concern  . Not on file   Social History Narrative    No past surgical history on file.  Family History  Problem Relation Age of Onset  . Diabetes Neg Hx     1st degree relative     Allergies  Allergen Reactions  . Latex     REACTION: Rash    Current Outpatient Prescriptions on File Prior to Visit  Medication Sig Dispense Refill  . aspirin 81 MG tablet Take 81 mg by mouth daily.      Marland Kitchen atenolol-chlorthalidone (TENORETIC) 50-25 MG per tablet Take 1 tablet by mouth daily. 100 tablet 3  . atorvastatin (LIPITOR) 80 MG tablet Take 1 tablet (80 mg total) by mouth  daily. One p.o. nightly 100 tablet 3  . glipiZIDE (GLUCOTROL) 5 MG tablet TAKE 1 TABLET BY MOUTH PRIOR TO BREAKFAST 100 tablet 0  . insulin aspart protamine- aspart (NOVOLOG MIX 70/30) (70-30) 100 UNIT/ML injection Inject 0.15 mLs (15 Units total) into the skin daily with breakfast. 10 mL 6  . Insulin Syringe-Needle U-100 (INSULIN SYRINGE 1CC/31GX5/16") 31G X 5/16" 1 ML MISC 1 each by Does not apply route daily. 100 each 3  . lisinopril (PRINIVIL,ZESTRIL) 5 MG tablet TAKE 1 TABLET (5 MG TOTAL) BY MOUTH DAILY. 100 tablet 1  . metFORMIN (GLUCOPHAGE) 1000 MG tablet TAKE 1 TABLET BY MOUTH TWICE A DAY 200 tablet 0  . sildenafil (VIAGRA) 100 MG tablet Take 1 tablet (100 mg total) by mouth as needed. As directed 10 tablet 11  . varenicline (CHANTIX CONTINUING MONTH PAK) 1 MG tablet Take 1 tablet (1 mg total) by mouth 2 (two) times daily. 60 tablet 3   No current facility-administered medications on file prior to visit.    BP 130/80 mmHg  Temp(Src) 98.5 F (36.9 C) (Oral)  Ht 5\' 8"  (1.727 m)  Wt 180 lb 8 oz (81.874 kg)  BMI 27.45 kg/m2       Objective:  Physical Exam  Constitutional: He is oriented to person, place, and time. He appears well-developed and well-nourished. No distress.  Musculoskeletal: Normal range of motion. He exhibits tenderness.       Left knee: He exhibits normal range of motion, no swelling, no effusion, no LCL laxity, normal patellar mobility, no bony tenderness, normal meniscus and no MCL laxity. Tenderness found. No medial joint line, no lateral joint line, no MCL, no LCL and no patellar tendon tenderness noted.  Lachmans and anterior drawer tests negative.  Slight tenderness along hamstring tendon.  No swelling or bruising noted  Neurological: He is alert and oriented to person, place, and time. He has normal reflexes. He displays normal reflexes. No cranial nerve deficit. He exhibits normal muscle tone. Coordination normal.  Skin: Skin is warm and dry. No rash  noted. He is not diaphoretic. No erythema. No pallor.  Psychiatric: He has a normal mood and affect. His behavior is normal. Judgment and thought content normal.  Nursing note and vitals reviewed.     Assessment & Plan:  1. Left knee pain - No acute concerns for knee injury. Doubt ACL/LCL/MCL tear. No concerned for meniscus tear at this time.  - Likely tendonitis. Continue to ice and ibuprofen 800 mg Q8 h x 3 days.  - ibuprofen (ADVIL,MOTRIN) 800 MG tablet; Take 1 tablet (800 mg total) by mouth every 8 (eight) hours as needed.  Dispense: 30 tablet; Refill: 0 - Follow up if no improvement in the next 2 weeks - Consider x ray or MRI  Tyler Peng, NP

## 2016-04-05 ENCOUNTER — Other Ambulatory Visit: Payer: Self-pay | Admitting: Family Medicine

## 2016-05-21 ENCOUNTER — Other Ambulatory Visit: Payer: Self-pay | Admitting: Family Medicine

## 2016-05-22 DIAGNOSIS — H2513 Age-related nuclear cataract, bilateral: Secondary | ICD-10-CM | POA: Diagnosis not present

## 2016-05-22 DIAGNOSIS — E119 Type 2 diabetes mellitus without complications: Secondary | ICD-10-CM | POA: Diagnosis not present

## 2016-06-20 ENCOUNTER — Other Ambulatory Visit (INDEPENDENT_AMBULATORY_CARE_PROVIDER_SITE_OTHER): Payer: BLUE CROSS/BLUE SHIELD

## 2016-06-20 DIAGNOSIS — Z0000 Encounter for general adult medical examination without abnormal findings: Secondary | ICD-10-CM

## 2016-06-20 LAB — HEPATIC FUNCTION PANEL
ALBUMIN: 4.4 g/dL (ref 3.5–5.2)
ALT: 25 U/L (ref 0–53)
AST: 17 U/L (ref 0–37)
Alkaline Phosphatase: 57 U/L (ref 39–117)
BILIRUBIN DIRECT: 0.1 mg/dL (ref 0.0–0.3)
TOTAL PROTEIN: 6.7 g/dL (ref 6.0–8.3)
Total Bilirubin: 0.6 mg/dL (ref 0.2–1.2)

## 2016-06-20 LAB — CBC WITH DIFFERENTIAL/PLATELET
BASOS PCT: 0.4 % (ref 0.0–3.0)
Basophils Absolute: 0 10*3/uL (ref 0.0–0.1)
EOS ABS: 0.1 10*3/uL (ref 0.0–0.7)
Eosinophils Relative: 1.5 % (ref 0.0–5.0)
HEMATOCRIT: 44 % (ref 39.0–52.0)
Hemoglobin: 15.3 g/dL (ref 13.0–17.0)
LYMPHS ABS: 1.9 10*3/uL (ref 0.7–4.0)
LYMPHS PCT: 19.3 % (ref 12.0–46.0)
MCHC: 34.7 g/dL (ref 30.0–36.0)
MCV: 91.8 fl (ref 78.0–100.0)
Monocytes Absolute: 0.8 10*3/uL (ref 0.1–1.0)
Monocytes Relative: 8.6 % (ref 3.0–12.0)
NEUTROS ABS: 6.7 10*3/uL (ref 1.4–7.7)
NEUTROS PCT: 70.2 % (ref 43.0–77.0)
PLATELETS: 233 10*3/uL (ref 150.0–400.0)
RBC: 4.8 Mil/uL (ref 4.22–5.81)
RDW: 13.8 % (ref 11.5–15.5)
WBC: 9.6 10*3/uL (ref 4.0–10.5)

## 2016-06-20 LAB — LIPID PANEL
CHOLESTEROL: 135 mg/dL (ref 0–200)
HDL: 33.2 mg/dL — ABNORMAL LOW (ref >39.00)
LDL Cholesterol: 71 mg/dL (ref 0–99)
NonHDL: 101.95
TRIGLYCERIDES: 153 mg/dL — AB (ref 0.0–149.0)
Total CHOL/HDL Ratio: 4
VLDL: 30.6 mg/dL (ref 0.0–40.0)

## 2016-06-20 LAB — POC URINALSYSI DIPSTICK (AUTOMATED)
Bilirubin, UA: NEGATIVE
Glucose, UA: NEGATIVE
Ketones, UA: NEGATIVE
Leukocytes, UA: NEGATIVE
NITRITE UA: NEGATIVE
PROTEIN UA: NEGATIVE
RBC UA: NEGATIVE
SPEC GRAV UA: 1.02
UROBILINOGEN UA: 1
pH, UA: 7

## 2016-06-20 LAB — BASIC METABOLIC PANEL
BUN: 17 mg/dL (ref 6–23)
CALCIUM: 8.9 mg/dL (ref 8.4–10.5)
CHLORIDE: 102 meq/L (ref 96–112)
CO2: 29 meq/L (ref 19–32)
CREATININE: 0.72 mg/dL (ref 0.40–1.50)
GFR: 118.84 mL/min (ref >60.00)
GLUCOSE: 84 mg/dL (ref 70–99)
Potassium: 4.2 mEq/L (ref 3.5–5.1)
Sodium: 139 mEq/L (ref 135–145)

## 2016-06-20 LAB — HEMOGLOBIN A1C: HEMOGLOBIN A1C: 7.5 % — AB (ref 4.6–6.5)

## 2016-06-20 LAB — TSH: TSH: 3.17 u[IU]/mL (ref 0.35–4.50)

## 2016-06-20 LAB — MICROALBUMIN / CREATININE URINE RATIO
CREATININE, U: 121.4 mg/dL
Microalb Creat Ratio: 0.6 mg/g (ref 0.0–30.0)

## 2016-06-20 LAB — PSA: PSA: 0.53 ng/mL (ref 0.10–4.00)

## 2016-06-26 ENCOUNTER — Encounter: Payer: Self-pay | Admitting: Family Medicine

## 2016-06-26 ENCOUNTER — Ambulatory Visit (INDEPENDENT_AMBULATORY_CARE_PROVIDER_SITE_OTHER): Payer: BLUE CROSS/BLUE SHIELD | Admitting: Family Medicine

## 2016-06-26 ENCOUNTER — Other Ambulatory Visit: Payer: Self-pay | Admitting: *Deleted

## 2016-06-26 VITALS — BP 120/70 | HR 65 | Temp 97.7°F | Ht 67.0 in | Wt 176.0 lb

## 2016-06-26 DIAGNOSIS — Z0000 Encounter for general adult medical examination without abnormal findings: Secondary | ICD-10-CM

## 2016-06-26 DIAGNOSIS — E119 Type 2 diabetes mellitus without complications: Secondary | ICD-10-CM

## 2016-06-26 DIAGNOSIS — Z23 Encounter for immunization: Secondary | ICD-10-CM

## 2016-06-26 DIAGNOSIS — I10 Essential (primary) hypertension: Secondary | ICD-10-CM | POA: Diagnosis not present

## 2016-06-26 DIAGNOSIS — E782 Mixed hyperlipidemia: Secondary | ICD-10-CM

## 2016-06-26 MED ORDER — ATORVASTATIN CALCIUM 80 MG PO TABS: 80.0000 mg | ORAL_TABLET | Freq: Every day | ORAL | 3 refills | Status: DC

## 2016-06-26 MED ORDER — METFORMIN HCL 1000 MG PO TABS: 1000.0000 mg | ORAL_TABLET | Freq: Two times a day (BID) | ORAL | 3 refills | Status: DC

## 2016-06-26 MED ORDER — GLIPIZIDE 5 MG PO TABS
5.0000 mg | ORAL_TABLET | Freq: Two times a day (BID) | ORAL | 3 refills | Status: DC
Start: 2016-06-26 — End: 2017-07-03

## 2016-06-26 MED ORDER — SILDENAFIL CITRATE 20 MG PO TABS: 20.0000 mg | ORAL_TABLET | Freq: Every evening | ORAL | 11 refills | Status: DC | PRN

## 2016-06-26 MED ORDER — LISINOPRIL 5 MG PO TABS: ORAL_TABLET | ORAL | 3 refills | Status: DC

## 2016-06-26 MED ORDER — ATENOLOL-CHLORTHALIDONE 50-25 MG PO TABS: 1.0000 | ORAL_TABLET | Freq: Every day | ORAL | 3 refills | Status: DC

## 2016-06-26 NOTE — Patient Instructions (Signed)
Increase the Glucotrol to 5 mg twice daily with food  Continue the metformin 1000 mg twice daily with food  Check a fasting blood sugar Monday Wednesday Friday  Return in 3 months for follow-up nonfasting labs one week prior  Stop smoking completely  Consult with Dr. Tonia Ghent at [facility] ear and throat because of your profound hearing loss  Continue your other medications

## 2016-06-26 NOTE — Progress Notes (Signed)
Pre visit review using our clinic review tool, if applicable. No additional management support is needed unless otherwise documented below in the visit note. 

## 2016-06-26 NOTE — Progress Notes (Signed)
Tyler Wolf is a 60 year old married male smoker but he is down to 1 cigarette about every 3 days. We had him on the Chantix a gram and he's done well. He's been off the Chantix now for about 9 months  He stopped his insulin is only taking Glucotrol 5 mg daily and metformin 1000 mg twice a day blood sugar 84 with an A1c of 7.5%.  He takes Tenoretic 50-25 daily for hypertension along with lisinopril 5 mg daily. BP 120/70 at home  He takes Lipitor and a baby aspirin daily for hyperlipidemia total cholesterol 135 HDL 33 LDL 71. He takes Aleve one twice a day because of joint pain. GFR is normal. He uses Viagra when necessary for ED  He's unable to hear me today. He says his hearing is been deteriorating over the last 10 years. 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 Advised him to see Tyler Wolf at [facility] per ENT for consult hearing aid evaluation.  He gets routine eye care, dental care, colonoscopy 2011 was normal.  Vaccinations up-to-date flu shot given today.  Review of systems otherwise negative  Physical examination  Vital signs stable he is afebrile HEENT were negative neck was supple no adenopathy thyroid not enlarged no carotid bruits cardiopulmonary exam normal Tyler Wolf exam normal extremities normal skin normal peripheral pulses normal. No retinopathy no diabetic neuropathy either.  Impression  #1 diabetes not at goal....... includes Tyler Wolf told 5 mg twice a day for follow-up A1c in 3 months. As noted above he stopped taking insulin about 6 months ago  #2 hypertension at goal........ continue current therapy  #3.Marland Kitchen Hearing loss........Marland Kitchen recommend [facility] ENT doctor Tyler Wolf for further evaluation  #4 hyperlipidemia.......... continue Lipitor and aspirin  #5 erectile dysfunction Viagra when necessary.

## 2016-06-29 ENCOUNTER — Telehealth: Payer: Self-pay | Admitting: Family Medicine

## 2016-06-29 ENCOUNTER — Other Ambulatory Visit: Payer: Self-pay | Admitting: Emergency Medicine

## 2016-06-29 DIAGNOSIS — H9190 Unspecified hearing loss, unspecified ear: Secondary | ICD-10-CM

## 2016-06-29 NOTE — Telephone Encounter (Signed)
The patient needs a refferal to the ENT Dr. Starr Sinclair

## 2016-06-29 NOTE — Telephone Encounter (Signed)
Per Dr.Todd order has been placed. Pt is aware and verbalized understanding.

## 2016-06-30 DIAGNOSIS — H9313 Tinnitus, bilateral: Secondary | ICD-10-CM | POA: Diagnosis not present

## 2016-06-30 DIAGNOSIS — H903 Sensorineural hearing loss, bilateral: Secondary | ICD-10-CM | POA: Diagnosis not present

## 2016-08-02 DIAGNOSIS — M25562 Pain in left knee: Secondary | ICD-10-CM | POA: Diagnosis not present

## 2016-09-25 ENCOUNTER — Ambulatory Visit (INDEPENDENT_AMBULATORY_CARE_PROVIDER_SITE_OTHER): Payer: BLUE CROSS/BLUE SHIELD | Admitting: Family Medicine

## 2016-09-25 ENCOUNTER — Encounter: Payer: Self-pay | Admitting: Family Medicine

## 2016-09-25 VITALS — BP 119/70 | HR 69 | Temp 98.2°F | Ht 67.0 in | Wt 177.8 lb

## 2016-09-25 DIAGNOSIS — E119 Type 2 diabetes mellitus without complications: Secondary | ICD-10-CM

## 2016-09-25 DIAGNOSIS — E782 Mixed hyperlipidemia: Secondary | ICD-10-CM

## 2016-09-25 DIAGNOSIS — F17200 Nicotine dependence, unspecified, uncomplicated: Secondary | ICD-10-CM

## 2016-09-25 MED ORDER — GLIPIZIDE 10 MG PO TABS: ORAL_TABLET | ORAL | 4 refills | Status: DC

## 2016-09-25 NOTE — Patient Instructions (Signed)
Continue current blood pressure medication  Since your blood pressure is normal I would check it once weekly,,,,,,,,,,,, goal 130/80 or less  Check your fasting blood sugar Monday Wednesday Friday  Increase your Glucotrol,,,,,,,,,, 10 mg in the morning with breakfast,,,,,,,,,, 5 mg in the evening with your evening male  Continue the metformin 1000 mg twice daily with food  Fasting labs in 3 months,,,,,,, follow-up office visit 1 week after labs  Continue the Chantix........Marland Kitchen

## 2016-09-25 NOTE — Progress Notes (Signed)
Pre visit review using our clinic review tool, if applicable. No additional management support is needed unless otherwise documented below in the visit note. 

## 2016-09-25 NOTE — Progress Notes (Signed)
Tyler Wolf is a 60 year old married male nonsmoker who comes in today for evaluation of hypertension diabetes  His blood pressure on Tenoretic 50-25 daily and lisinopril 5 mg daily is 119/70. Blood pressure at home every 2 weeks is 120/80 at goal.  Blood sugars in the 135-140 range. He checks his blood sugar every 3 weeks. Advised to check it 3 times a week which he's not done. He doesn't follow strict diet. He's on Glucotrol 5 mg twice a day and metformin 1000 mg twice a day. We will increase his Glucotrol. He's off his insulin.  He continues to take the Chantix to help stop smoking. He's not quit completely yet. But he is trying.  .......BP 119/70 (BP Location: Left Arm, Patient Position: Sitting, Cuff Size: Normal)   Pulse 69   Temp 98.2 F (36.8 C) (Oral)   Ht 5\' 7"  (1.702 m)   Wt 177 lb 12.8 oz (80.6 kg)   BMI 27.85 kg/m  BP right arm sitting position 120/80  Impression hypertension at goal..... Continue current therapy  Diabetes type 2 not at goal with A1c of 7.5%......... increase Glucotrol to 10 mg in the morning 5 prior evening meal continue metformin 1000 mg twice a day...Marland KitchenMarland KitchenMarland Kitchen carbohydrate free diet...Marland KitchenMarland KitchenMarland Kitchen follow-up A1c in 3 months  Tobacco abuse......... continue Chantix and tapering off the nicotine

## 2016-12-18 ENCOUNTER — Other Ambulatory Visit (INDEPENDENT_AMBULATORY_CARE_PROVIDER_SITE_OTHER): Payer: BLUE CROSS/BLUE SHIELD

## 2016-12-18 DIAGNOSIS — E119 Type 2 diabetes mellitus without complications: Secondary | ICD-10-CM

## 2016-12-18 LAB — MICROALBUMIN / CREATININE URINE RATIO
Creatinine,U: 146.2 mg/dL
MICROALB UR: 0.9 mg/dL (ref 0.0–1.9)
Microalb Creat Ratio: 0.6 mg/g (ref 0.0–30.0)

## 2016-12-18 LAB — HEMOGLOBIN A1C: Hgb A1c MFr Bld: 7.6 % — ABNORMAL HIGH (ref 4.6–6.5)

## 2016-12-18 LAB — BASIC METABOLIC PANEL
BUN: 20 mg/dL (ref 6–23)
CO2: 29 mEq/L (ref 19–32)
CREATININE: 0.82 mg/dL (ref 0.40–1.50)
Calcium: 9.2 mg/dL (ref 8.4–10.5)
Chloride: 101 mEq/L (ref 96–112)
GFR: 102.11 mL/min (ref >60.00)
GLUCOSE: 78 mg/dL (ref 70–99)
Potassium: 4.3 mEq/L (ref 3.5–5.1)
Sodium: 140 mEq/L (ref 135–145)

## 2016-12-25 ENCOUNTER — Ambulatory Visit: Payer: BLUE CROSS/BLUE SHIELD | Admitting: Family Medicine

## 2017-01-01 ENCOUNTER — Ambulatory Visit: Payer: BLUE CROSS/BLUE SHIELD | Admitting: Family Medicine

## 2017-01-09 ENCOUNTER — Ambulatory Visit (INDEPENDENT_AMBULATORY_CARE_PROVIDER_SITE_OTHER): Payer: BLUE CROSS/BLUE SHIELD | Admitting: Family Medicine

## 2017-01-09 ENCOUNTER — Encounter: Payer: Self-pay | Admitting: Family Medicine

## 2017-01-09 VITALS — BP 140/80 | Temp 98.2°F | Wt 187.0 lb

## 2017-01-09 DIAGNOSIS — E119 Type 2 diabetes mellitus without complications: Secondary | ICD-10-CM

## 2017-01-09 MED ORDER — GLIPIZIDE 10 MG PO TABS: ORAL_TABLET | ORAL | 3 refills | Status: DC

## 2017-01-09 NOTE — Progress Notes (Signed)
Pre visit review using our clinic review tool, if applicable. No additional management support is needed unless otherwise documented below in the visit note. 

## 2017-01-09 NOTE — Patient Instructions (Signed)
Glucotrol 10 mg before breakfast............ continue the 5 mg tablet before your evening male  Metformin 1000 mg........ one tablet twice daily before meals  Follow-up the third week in September for your annual physical examination  Fasting labs one week prior

## 2017-01-09 NOTE — Progress Notes (Signed)
Tyler Wolf is a 61 year old married male nonsmoker who comes in today for follow-up of diabetes  He's on metformin 1000 mg twice a day and Glucotrol 5 mg twice a day. He's been off his insulin now for about 8 months. He was on 15 units daily. Recent blood sugar was 78 with an A1c of 7.6%. Microalbumin was 0.9. He states he stopped the insulin because of its cost.  His diet is good he exercises daily his weight is up but he has a lot of contraptions on him today. He says weight is home is unchanged.  Last physical exam September 2017 otherwise normal.  BP 140/80 (BP Location: Left Arm, Patient Position: Sitting, Cuff Size: Normal)   Temp 98.2 F (36.8 C) (Oral)   Wt 187 lb (84.8 kg)   BMI 29.29 kg/m  Tyler Wolf is well-developed well-nourished male no acute distress  #1 diabetes....... increase Glucotrol to 10 mg in the morning...Marland KitchenMarland KitchenMarland Kitchen continue 5 mg in the evening before meals. Also continue metformin. Follow-up C CPX in September and labs.

## 2017-01-11 ENCOUNTER — Ambulatory Visit (HOSPITAL_COMMUNITY)
Admission: EM | Admit: 2017-01-11 | Discharge: 2017-01-11 | Disposition: A | Discharge status: Home / Self Care | Payer: BLUE CROSS/BLUE SHIELD | Attending: Family Medicine | Admitting: Family Medicine

## 2017-01-11 ENCOUNTER — Encounter (HOSPITAL_COMMUNITY): Payer: Self-pay | Admitting: Family Medicine

## 2017-01-11 DIAGNOSIS — M5431 Sciatica, right side: Secondary | ICD-10-CM

## 2017-01-11 MED ORDER — METHYLPREDNISOLONE 4 MG PO TBPK: ORAL_TABLET | ORAL | 1 refills | Status: DC

## 2017-01-11 MED ORDER — OXYCODONE-ACETAMINOPHEN 5-325 MG PO TABS: 2.0000 | ORAL_TABLET | ORAL | 0 refills | Status: DC | PRN

## 2017-01-11 MED FILL — OXYCODONE/APAP 5/325 MG TAB: 5-325 | 2 days supply | Qty: 15 | Fill #0

## 2017-01-11 MED FILL — METHYLPREDNISOLONE 4 MG TAB: 4 | 6 days supply | Qty: 21 | Fill #0

## 2017-01-11 NOTE — ED Provider Notes (Signed)
[facility]    CSN: [id number] Arrival date & time: 01/11/17  1014     History   Chief Complaint Chief Complaint  Patient presents with  . Back Pain    HPI Tyler Wolf is a 61 y.o. male.   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 He's never had this before. He denies any weakness in the right leg but is not been able to stand up completely. When he straightens his leg he has worsening pain. He denies any numbness but he does have the sense is about to have a muscle spasm in his right calf.  He's had no fever, loss of bladder control, or pain in his left leg. When he straightens his left leg, he has pain in his right leg.      Past Medical History:  Diagnosis Date  . DIABETES MELLITUS, TYPE II 05/17/2007  . DIVERTICULITIS, ACUTE 08/17/2010  . 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 HYPERLIPIDEMIA, MIXED 05/27/2007  . HYPERTENSION 05/17/2007  . TOBACCO ABUSE 05/27/2007    Patient Active Problem List   Diagnosis Date Noted  . Diabetes ([place]) 06/04/2014  . HYPERLIPIDEMIA, MIXED 05/27/2007  . TOBACCO ABUSE 05/27/2007  . 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 Essential hypertension 05/17/2007    History reviewed. No pertinent surgical history.     Home Medications    Prior to Admission medications   Medication Sig Start Date End Date Taking? Authorizing Provider  aspirin 81 MG tablet Take 81 mg by mouth daily.      Historical Provider, MD  atenolol-chlorthalidone (TENORETIC) 50-25 MG tablet Take 1 tablet by mouth daily. 06/26/16   Dorena Cookey, MD  atorvastatin (LIPITOR) 80 MG tablet TAKE 1 TABLET (80 MG TOTAL) BY MOUTH NIGHTLY 05/22/16   Dorena Cookey, MD  glipiZIDE (GLUCOTROL) 10 MG tablet 1 tablet with breakfast daily 09/25/16   Dorena Cookey, MD  glipiZIDE (GLUCOTROL) 10 MG tablet 1 tablet before breakfast 01/09/17   Dorena Cookey, MD  glipiZIDE (GLUCOTROL) 5 MG tablet  Take 1 tablet (5 mg total) by mouth 2 (two) times daily before a meal. 06/26/16   Dorena Cookey, MD  insulin aspart protamine- aspart (NOVOLOG MIX 70/30) (70-30) 100 UNIT/ML injection Inject 0.15 mLs (15 Units total) into the skin daily with breakfast. 08/18/14   Dorena Cookey, MD  lisinopril (PRINIVIL,ZESTRIL) 5 MG tablet TAKE 1 TABLET (5 MG TOTAL) BY MOUTH DAILY. 06/26/16   Dorena Cookey, MD  metFORMIN (GLUCOPHAGE) 1000 MG tablet Take 1 tablet (1,000 mg total) by mouth 2 (two) times daily. 06/26/16   Dorena Cookey, MD  methylPREDNISolone (MEDROL DOSEPAK) 4 MG TBPK tablet As directed 01/11/17   Robyn Haber, MD  oxyCODONE-acetaminophen (PERCOCET/ROXICET) 5-325 MG tablet Take 2 tablets by mouth every 4 (four) hours as needed for severe pain. 01/11/17   Robyn Haber, MD  sildenafil (REVATIO) 20 MG tablet Take 1 tablet (20 mg total) by mouth at bedtime as needed. 06/26/16   Dorena Cookey, MD    Family History Family History  Problem Relation Age of Onset  . Diabetes Neg Hx     1st degree relative     Social History Social History  Substance Use Topics  . Smoking status: Current Some Day Smoker    Packs/day: 0.50  . Smokeless tobacco: Never Used     Comment: electronic cigarettes  . Alcohol use No  Allergies   Latex   Review of Systems Review of Systems  Constitutional: Negative.   HENT: Negative.   Respiratory: Negative.   Genitourinary: Negative.   Musculoskeletal: Positive for back pain and gait problem.     Physical Exam Triage Vital Signs ED Triage Vitals [01/11/17 1111]  Enc Vitals Group     BP (!) 141/74     Pulse Rate 72     Resp 16     Temp 97.7 F (36.5 C)     Temp Source Oral     SpO2 99 %     Weight      Height      Head Circumference      Peak Flow      Pain Score      Pain Loc      Pain Edu?      Excl. in [place]?    No data found.   Updated Vital Signs BP (!) 141/74 (BP Location: Left Arm)   Pulse 72   Temp 97.7 F (36.5 C) (Oral)    Resp 16   SpO2 99%    Physical Exam  Constitutional: He is oriented to person, place, and time. He appears well-developed and well-nourished.  HENT:  Right Ear: External ear normal.  Left Ear: External ear normal.  Mouth/Throat: Oropharynx is clear and moist.  Eyes: Conjunctivae are normal. Pupils are equal, round, and reactive to light.  Neck: Normal range of motion. Neck supple.  Pulmonary/Chest: Effort normal.  Musculoskeletal:  Positive straight leg raising on the right, pain reproduced with straight leg raising on the left.  Neurological: He is alert and oriented to person, place, and time.  Skin: Skin is warm and dry.  Nursing note and vitals reviewed.    UC Treatments / Results  Labs (all labs ordered are listed, but only abnormal results are displayed) Labs Reviewed - No data to display  EKG  EKG Interpretation None       Radiology No results found.  Procedures Procedures (including critical care time)  Medications Ordered in UC Medications - No data to display   Initial Impression / Assessment and Plan / UC Course  I have reviewed the triage vital signs and the nursing notes.  Pertinent labs & imaging results that were available during my care of the patient were reviewed by me and considered in my medical decision making (see chart for details).     Final Clinical Impressions(s) / UC Diagnoses   Final diagnoses:  Sciatica of right side    New Prescriptions New Prescriptions   METHYLPREDNISOLONE (MEDROL DOSEPAK) 4 MG TBPK TABLET    As directed   OXYCODONE-ACETAMINOPHEN (PERCOCET/ROXICET) 5-325 MG TABLET    Take 2 tablets by mouth every 4 (four) hours as needed for severe pain.     Robyn Haber, MD 01/11/17 [phone]

## 2017-01-11 NOTE — ED Triage Notes (Signed)
Triaged by provider  

## 2017-01-11 NOTE — Discharge Instructions (Signed)
Call me if you worsen or don't improve by Sunday [phone]

## 2017-01-19 MED FILL — METHYLPREDNISOLONE 4 MG TAB: 4 | 6 days supply | Qty: 21 | Fill #1

## 2017-07-03 ENCOUNTER — Encounter: Payer: Self-pay | Admitting: Family Medicine

## 2017-07-03 ENCOUNTER — Other Ambulatory Visit: Payer: Self-pay | Admitting: Family Medicine

## 2017-07-03 ENCOUNTER — Ambulatory Visit (INDEPENDENT_AMBULATORY_CARE_PROVIDER_SITE_OTHER): Payer: BLUE CROSS/BLUE SHIELD | Admitting: Family Medicine

## 2017-07-03 VITALS — BP 120/80 | HR 60 | Temp 97.9°F | Ht 67.0 in | Wt 171.0 lb

## 2017-07-03 DIAGNOSIS — E782 Mixed hyperlipidemia: Secondary | ICD-10-CM

## 2017-07-03 DIAGNOSIS — E119 Type 2 diabetes mellitus without complications: Secondary | ICD-10-CM

## 2017-07-03 DIAGNOSIS — I10 Essential (primary) hypertension: Secondary | ICD-10-CM

## 2017-07-03 DIAGNOSIS — Z23 Encounter for immunization: Secondary | ICD-10-CM | POA: Diagnosis not present

## 2017-07-03 DIAGNOSIS — M7122 Synovial cyst of popliteal space [Baker], left knee: Secondary | ICD-10-CM

## 2017-07-03 LAB — HEPATIC FUNCTION PANEL
ALK PHOS: 64 U/L (ref 39–117)
ALT: 27 U/L (ref 0–53)
AST: 18 U/L (ref 0–37)
Albumin: 4.4 g/dL (ref 3.5–5.2)
Bilirubin, Direct: 0.1 mg/dL (ref 0.0–0.3)
TOTAL PROTEIN: 6.8 g/dL (ref 6.0–8.3)
Total Bilirubin: 0.7 mg/dL (ref 0.2–1.2)

## 2017-07-03 LAB — POCT URINALYSIS DIPSTICK
Bilirubin, UA: NEGATIVE
Clarity, UA: NEGATIVE
GLUCOSE UA: NEGATIVE
Ketones, UA: NEGATIVE
Leukocytes, UA: NEGATIVE
Nitrite, UA: NEGATIVE
PROTEIN UA: NEGATIVE
RBC UA: NEGATIVE
SPEC GRAV UA: 1.015 (ref 1.010–1.025)
UROBILINOGEN UA: 0.2 U/dL
pH, UA: 7.5 (ref 5.0–8.0)

## 2017-07-03 LAB — CBC WITH DIFFERENTIAL/PLATELET
BASOS PCT: 0.4 % (ref 0.0–3.0)
Basophils Absolute: 0 10*3/uL (ref 0.0–0.1)
EOS ABS: 0.2 10*3/uL (ref 0.0–0.7)
EOS PCT: 2.5 % (ref 0.0–5.0)
HCT: 46.5 % (ref 39.0–52.0)
Hemoglobin: 15.8 g/dL (ref 13.0–17.0)
Lymphocytes Relative: 26.3 % (ref 12.0–46.0)
Lymphs Abs: 2 10*3/uL (ref 0.7–4.0)
MCHC: 34 g/dL (ref 30.0–36.0)
MCV: 91.4 fl (ref 78.0–100.0)
MONO ABS: 0.6 10*3/uL (ref 0.1–1.0)
Monocytes Relative: 7.4 % (ref 3.0–12.0)
NEUTROS ABS: 4.9 10*3/uL (ref 1.4–7.7)
Neutrophils Relative %: 63.4 % (ref 43.0–77.0)
PLATELETS: 241 10*3/uL (ref 150.0–400.0)
RBC: 5.09 Mil/uL (ref 4.22–5.81)
RDW: 13.4 % (ref 11.5–15.5)
WBC: 7.8 10*3/uL (ref 4.0–10.5)

## 2017-07-03 LAB — BASIC METABOLIC PANEL
BUN: 19 mg/dL (ref 6–23)
CO2: 31 mEq/L (ref 19–32)
Calcium: 9.4 mg/dL (ref 8.4–10.5)
Chloride: 99 mEq/L (ref 96–112)
Creatinine, Ser: 0.64 mg/dL (ref 0.40–1.50)
GFR: 135.66 mL/min (ref >60.00)
Glucose, Bld: 122 mg/dL — ABNORMAL HIGH (ref 70–99)
POTASSIUM: 4 meq/L (ref 3.5–5.1)
Sodium: 137 mEq/L (ref 135–145)

## 2017-07-03 LAB — MICROALBUMIN / CREATININE URINE RATIO
Creatinine,U: 121.5 mg/dL
MICROALB/CREAT RATIO: 0.6 mg/g (ref 0.0–30.0)
Microalb, Ur: 0.7 mg/dL (ref 0.0–1.9)

## 2017-07-03 LAB — LIPID PANEL
CHOLESTEROL: 156 mg/dL (ref 0–200)
HDL: 41.5 mg/dL (ref >39.00)
LDL Cholesterol: 79 mg/dL (ref 0–99)
NonHDL: 114.96
TRIGLYCERIDES: 179 mg/dL — AB (ref 0.0–149.0)
Total CHOL/HDL Ratio: 4
VLDL: 35.8 mg/dL (ref 0.0–40.0)

## 2017-07-03 LAB — PSA: PSA: 0.59 ng/mL (ref 0.10–4.00)

## 2017-07-03 LAB — HEMOGLOBIN A1C: HEMOGLOBIN A1C: 9.2 % — AB (ref 4.6–6.5)

## 2017-07-03 LAB — TSH: TSH: 2.06 u[IU]/mL (ref 0.35–4.50)

## 2017-07-03 MED ORDER — GLIPIZIDE 10 MG PO TABS: ORAL_TABLET | ORAL | 4 refills | Status: DC

## 2017-07-03 MED ORDER — GLIPIZIDE 5 MG PO TABS: 5.0000 mg | ORAL_TABLET | Freq: Two times a day (BID) | ORAL | 3 refills | Status: DC

## 2017-07-03 MED ORDER — SILDENAFIL CITRATE 20 MG PO TABS: 20.0000 mg | ORAL_TABLET | Freq: Every evening | ORAL | 11 refills | Status: DC | PRN

## 2017-07-03 MED ORDER — METFORMIN HCL 1000 MG PO TABS: 1000.0000 mg | ORAL_TABLET | Freq: Two times a day (BID) | ORAL | 4 refills | Status: DC

## 2017-07-03 MED ORDER — ATENOLOL-CHLORTHALIDONE 50-25 MG PO TABS: 1.0000 | ORAL_TABLET | Freq: Every day | ORAL | 4 refills | Status: DC

## 2017-07-03 MED ORDER — ATORVASTATIN CALCIUM 80 MG PO TABS: ORAL_TABLET | ORAL | 4 refills | Status: DC

## 2017-07-03 MED ORDER — LISINOPRIL 5 MG PO TABS: ORAL_TABLET | ORAL | 3 refills | Status: DC

## 2017-07-03 NOTE — Progress Notes (Signed)
Tyler Wolf is a 61 year old married male nonsmoker...Marland KitchenMarland Kitchen 3 months he quit cold [place]..... Who comes in today for evaluation of diabetes, hypertension, hyperlipidemia, erectile dysfunction He takes Tenoretic 50-25 one daily along with lisinopril 5 mg daily. BP is 120/80  He takes Lipitor 80 and an aspirin tablet daily because of a history of hyperlipidemia  His diabetes is managed as follows.......... he works hard on his diet his exercise his weight is good at 171. He takes 5 mg of Glucotrol twice a day metformin 1000 mg twice daily and a 10 mg Leuko-Trol before breakfast. He was on insulin via sliding scale but he stopped it because of the cost. He also says his blood sugars normal. He checks it periodically. He says is averaging about 130.  He tried the generic Viagra but it didn't help much. We discussed doubling the dose  He gets routine eye care by Dr. Zadie Rhine because of his diabetes. He gets regular dental care. Colonoscopy 2011 normal.  Review of systems negative except for swelling behind his left knee for 3 months. Does not recall any history of trauma.  He quit smoking 3 months ago via cold [place]. Vaccinations flu shot given today information given on shingles.  14 point review of systems reviewed and otherwise negative  Social history he is married lives here in the [place] area. He works as a Games developer.  BP 120/80 (BP Location: Left Arm, Patient Position: Sitting, Cuff Size: Normal)   Pulse 60   Temp 97.9 F (36.6 C) (Oral)   Ht 5\' 7"  (1.702 m)   Wt 171 lb (77.6 kg)   BMI 26.78 kg/m  Examination of the HEENT were negative except he has glasses and bilateral hearing aids. Neck was supple thyroid is not enlarged no carotid bruits cardiopulmonary exam normal abdominal exam normal genitalia normal circumcised male rectal normal stool guaiac-negative prostate normal extremities normal skin normal peripheral pulses normal. Except for a popliteal cyst behind his left knee.  Because of  his diabetes........ no neuropathy.  #1 diabetes type 1 at goal,,,,,,, check labs  #2 hypertension at goal,,,,,,,, continue current therapy  #3 hyperlipidemia,,,,,,,, continue Lipitor and aspirin check labs  #4 hearing loss,,,,,,,, continue hearing aids  #5 erectile dysfunction,,,,,,,,,, double dose of generic Viagra  #6 popliteal cyst,,,,,,, refer to Dr. Durward Fortes

## 2017-07-03 NOTE — Patient Instructions (Addendum)
Continue u current meds I will call u tomorrow about u labs  Call Dr. Joni Fears orthopedist for evaluation of the popliteal cyst behind her left knee.

## 2017-07-09 ENCOUNTER — Encounter: Payer: Self-pay | Admitting: Family Medicine

## 2017-07-09 ENCOUNTER — Ambulatory Visit (INDEPENDENT_AMBULATORY_CARE_PROVIDER_SITE_OTHER): Payer: BLUE CROSS/BLUE SHIELD | Admitting: Family Medicine

## 2017-07-09 VITALS — BP 132/70 | HR 70 | Temp 98.2°F | Wt 181.0 lb

## 2017-07-09 DIAGNOSIS — E119 Type 2 diabetes mellitus without complications: Secondary | ICD-10-CM | POA: Diagnosis not present

## 2017-07-09 NOTE — Patient Instructions (Signed)
Metformin 1000 mg.......... one before breakfast and one before your evening meal at 5:30 PM  Glucotrol 10 mg......... one before breakfast  Glucotrol 5 mg......Marland Kitchen 1 at 5:30 before your evening male  Return in 2 months for nonfasting labs

## 2017-07-09 NOTE — Progress Notes (Signed)
Peterson Ao is a 61 year old married male smoker who comes in today for evaluation diabetes  We did his physical examination last week his blood sugar was 122 fasting however his A1c was 9.2%. He comes back today for follow-up  In reviewing his medication history he states she's not taking his medicine on a daily basis. He is following a fairly strict diet and is exercising daily.  BP 132/70 (BP Location: Left Arm, Patient Position: Sitting, Cuff Size: Normal)   Pulse 70   Temp 98.2 F (36.8 C) (Oral)   Wt 181 lb (82.1 kg)   SpO2 96%   BMI 28.35 kg/m  General he is well-developed well-nourished male no acute distress  #1 diabetes type 2 not at goal,,,,,,,, encouraged take his medication daily,,,,,,,,,,, follow-up A1c in 3 months

## 2017-08-22 ENCOUNTER — Other Ambulatory Visit: Payer: Self-pay | Admitting: Family Medicine

## 2017-08-23 ENCOUNTER — Telehealth: Payer: Self-pay

## 2017-08-23 NOTE — Telephone Encounter (Signed)
Spoke with [facility] in [place] requested for them to fax a copy of the surgical clearance form for completion per [employer].

## 2017-09-03 ENCOUNTER — Other Ambulatory Visit (INDEPENDENT_AMBULATORY_CARE_PROVIDER_SITE_OTHER): Payer: BLUE CROSS/BLUE SHIELD

## 2017-09-03 DIAGNOSIS — E119 Type 2 diabetes mellitus without complications: Secondary | ICD-10-CM | POA: Diagnosis not present

## 2017-09-03 LAB — CBC WITH DIFFERENTIAL/PLATELET
BASOS ABS: 0 10*3/uL (ref 0.0–0.1)
BASOS PCT: 0.3 % (ref 0.0–3.0)
EOS PCT: 2.3 % (ref 0.0–5.0)
Eosinophils Absolute: 0.2 10*3/uL (ref 0.0–0.7)
HCT: 44.8 % (ref 39.0–52.0)
Hemoglobin: 15.5 g/dL (ref 13.0–17.0)
LYMPHS ABS: 2.1 10*3/uL (ref 0.7–4.0)
Lymphocytes Relative: 23.5 % (ref 12.0–46.0)
MCHC: 34.5 g/dL (ref 30.0–36.0)
MCV: 92.1 fl (ref 78.0–100.0)
MONO ABS: 0.8 10*3/uL (ref 0.1–1.0)
Monocytes Relative: 8.6 % (ref 3.0–12.0)
NEUTROS ABS: 5.9 10*3/uL (ref 1.4–7.7)
Neutrophils Relative %: 65.3 % (ref 43.0–77.0)
Platelets: 246 10*3/uL (ref 150.0–400.0)
RBC: 4.87 Mil/uL (ref 4.22–5.81)
RDW: 13.5 % (ref 11.5–15.5)
WBC: 9.1 10*3/uL (ref 4.0–10.5)

## 2017-09-03 LAB — PSA: PSA: 0.64 ng/mL (ref 0.10–4.00)

## 2017-09-03 LAB — HEPATIC FUNCTION PANEL
ALBUMIN: 4.4 g/dL (ref 3.5–5.2)
ALK PHOS: 55 U/L (ref 39–117)
ALT: 30 U/L (ref 0–53)
AST: 20 U/L (ref 0–37)
BILIRUBIN DIRECT: 0.1 mg/dL (ref 0.0–0.3)
Total Bilirubin: 0.8 mg/dL (ref 0.2–1.2)
Total Protein: 6.8 g/dL (ref 6.0–8.3)

## 2017-09-03 LAB — BASIC METABOLIC PANEL
BUN: 15 mg/dL (ref 6–23)
CALCIUM: 9.6 mg/dL (ref 8.4–10.5)
CO2: 32 meq/L (ref 19–32)
CREATININE: 0.72 mg/dL (ref 0.40–1.50)
Chloride: 100 mEq/L (ref 96–112)
GFR: 118.35 mL/min (ref >60.00)
Glucose, Bld: 81 mg/dL (ref 70–99)
Potassium: 4.3 mEq/L (ref 3.5–5.1)
Sodium: 139 mEq/L (ref 135–145)

## 2017-09-03 LAB — LIPID PANEL
CHOL/HDL RATIO: 4
Cholesterol: 127 mg/dL (ref 0–200)
HDL: 32.8 mg/dL — ABNORMAL LOW (ref >39.00)
LDL Cholesterol: 63 mg/dL (ref 0–99)
NONHDL: 94.68
Triglycerides: 159 mg/dL — ABNORMAL HIGH (ref 0.0–149.0)
VLDL: 31.8 mg/dL (ref 0.0–40.0)

## 2017-09-03 LAB — TSH: TSH: 3.02 u[IU]/mL (ref 0.35–4.50)

## 2017-09-03 LAB — HEMOGLOBIN A1C: HEMOGLOBIN A1C: 8.4 % — AB (ref 4.6–6.5)

## 2017-10-15 DIAGNOSIS — E119 Type 2 diabetes mellitus without complications: Secondary | ICD-10-CM | POA: Diagnosis not present

## 2017-10-15 DIAGNOSIS — H2513 Age-related nuclear cataract, bilateral: Secondary | ICD-10-CM | POA: Diagnosis not present

## 2017-10-15 LAB — HM DIABETES EYE EXAM

## 2017-11-19 ENCOUNTER — Encounter: Payer: Self-pay | Admitting: Family Medicine

## 2017-11-19 ENCOUNTER — Ambulatory Visit: Payer: BLUE CROSS/BLUE SHIELD | Admitting: Family Medicine

## 2017-11-19 VITALS — BP 120/78 | HR 68 | Temp 97.9°F | Wt 171.0 lb

## 2017-11-19 DIAGNOSIS — E1140 Type 2 diabetes mellitus with diabetic neuropathy, unspecified: Secondary | ICD-10-CM

## 2017-11-19 DIAGNOSIS — Z794 Long term (current) use of insulin: Secondary | ICD-10-CM

## 2017-11-19 LAB — BASIC METABOLIC PANEL
BUN: 26 mg/dL — AB (ref 6–23)
CHLORIDE: 97 meq/L (ref 96–112)
CO2: 31 meq/L (ref 19–32)
CREATININE: 0.83 mg/dL (ref 0.40–1.50)
Calcium: 9.5 mg/dL (ref 8.4–10.5)
GFR: 100.37 mL/min (ref >60.00)
GLUCOSE: 89 mg/dL (ref 70–99)
POTASSIUM: 4.4 meq/L (ref 3.5–5.1)
Sodium: 139 mEq/L (ref 135–145)

## 2017-11-19 LAB — HEMOGLOBIN A1C: HEMOGLOBIN A1C: 8.5 % — AB (ref 4.6–6.5)

## 2017-11-19 MED ORDER — INSULIN ASPART PROT & ASPART (70-30 MIX) 100 UNIT/ML ~~LOC~~ SUSP: 15.0000 [IU] | Freq: Every day | SUBCUTANEOUS | 6 refills | Status: DC

## 2017-11-19 NOTE — Progress Notes (Signed)
Tyler Wolf is a 62 year old married male nonsmoker who comes in today for evaluation of bilateral lower extremity pain  He says a week ago Sunday he developed pain both legs from knees to his ankles. He describes as a sharp tingling pain. It comes and goes. It's not associated with exertion.  He has diabetes type 1. He is on metformin 1000 mg twice a day, Glucotrol 15 mg in the morning and 5 mg at bedtime............ he was on 15 units of insulin daily bruits. Office insulin now for couple months. Last A1c was 8.19 August 2017  He states his blood sugars morning was over 170.  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

## 2017-11-19 NOTE — Patient Instructions (Signed)
Insulin..........Marland Kitchen 15 units now.......Marland Kitchen 15 units at bedtime tonight............... then starting tomorrow 15 units nightly at bedtime.........  Check a blood sugar daily in the morning  Return in 2 weeks for follow-up...........  Continue over-the-counter Aleve one twice daily for leg pain  Labs today

## 2017-12-03 ENCOUNTER — Ambulatory Visit: Payer: BLUE CROSS/BLUE SHIELD | Admitting: Family Medicine

## 2017-12-03 ENCOUNTER — Encounter: Payer: Self-pay | Admitting: Family Medicine

## 2017-12-03 VITALS — BP 120/78 | HR 94 | Temp 97.8°F | Wt 171.0 lb

## 2017-12-03 DIAGNOSIS — Z794 Long term (current) use of insulin: Secondary | ICD-10-CM

## 2017-12-03 DIAGNOSIS — I10 Essential (primary) hypertension: Secondary | ICD-10-CM

## 2017-12-03 DIAGNOSIS — E119 Type 2 diabetes mellitus without complications: Secondary | ICD-10-CM

## 2017-12-03 NOTE — Progress Notes (Signed)
Call is a 62 year old married male nonsmoker who comes in today for follow-up of diabetes type 1  We saw him a couple weeks ago for physical examination bedtime blood sugar was normal however his A1c was 8.5% and he had stopped his insulin. We restarted his insulin blood sugars now have normalized the nocturia has stopped and he says he feels better. His weight is steady at 171 pounds.  Blood pressure normal on medication  He did have an episode 4 days ago at 11 AM hypoglycemia. His blood sugar dropped to 50. He had taken his medication as directed. He ate something felt well and did not recheck a blood sugar  BP 120/78 (BP Location: Left Arm, Patient Position: Sitting, Cuff Size: Normal)   Pulse 94   Temp 97.8 F (36.6 C) (Oral)   Wt 171 lb (77.6 kg)   BMI 26.78 kg/m  Well-developed well-nourished male no acute distress vital signs stable he is afebrile  #1 diabetes type 1................ improved blood sugar control back on insulin........... switch the glipizide from 15 the morning and 5 in evening to 10 in the morning and 10 in the evening to minimize hypoglycemic episodes  Follow-up in May,,,,,,,,,, nonfasting labs one week prior.

## 2017-12-03 NOTE — Patient Instructions (Signed)
Glipizide 10 mg.......... in the morning  Glipizide 10 mg before your evening male  Continue good diet and exercise program  Follow-up in 3 months  Nonfasting labs one week prior

## 2017-12-24 ENCOUNTER — Ambulatory Visit: Payer: BLUE CROSS/BLUE SHIELD | Admitting: Family Medicine

## 2017-12-31 ENCOUNTER — Ambulatory Visit: Payer: BLUE CROSS/BLUE SHIELD | Admitting: Family Medicine

## 2018-02-09 DIAGNOSIS — H40033 Anatomical narrow angle, bilateral: Secondary | ICD-10-CM | POA: Diagnosis not present

## 2018-02-09 DIAGNOSIS — H04123 Dry eye syndrome of bilateral lacrimal glands: Secondary | ICD-10-CM | POA: Diagnosis not present

## 2018-02-26 ENCOUNTER — Other Ambulatory Visit (INDEPENDENT_AMBULATORY_CARE_PROVIDER_SITE_OTHER): Payer: BLUE CROSS/BLUE SHIELD

## 2018-02-26 DIAGNOSIS — E119 Type 2 diabetes mellitus without complications: Secondary | ICD-10-CM

## 2018-02-26 DIAGNOSIS — Z794 Long term (current) use of insulin: Secondary | ICD-10-CM

## 2018-02-26 LAB — BASIC METABOLIC PANEL
BUN: 17 mg/dL (ref 6–23)
CO2: 32 mEq/L (ref 19–32)
CREATININE: 0.75 mg/dL (ref 0.40–1.50)
Calcium: 9.4 mg/dL (ref 8.4–10.5)
Chloride: 97 mEq/L (ref 96–112)
GFR: 112.73 mL/min (ref >60.00)
Glucose, Bld: 77 mg/dL (ref 70–99)
Potassium: 4.1 mEq/L (ref 3.5–5.1)
Sodium: 137 mEq/L (ref 135–145)

## 2018-02-26 LAB — HEMOGLOBIN A1C: Hgb A1c MFr Bld: 7.7 % — ABNORMAL HIGH (ref 4.6–6.5)

## 2018-02-27 LAB — MICROALBUMIN / CREATININE URINE RATIO
Creatinine,U: 65.9 mg/dL
Microalb Creat Ratio: 1.1 mg/g (ref 0.0–30.0)
Microalb, Ur: <0.7 mg/dL (ref 0.0–1.9)

## 2018-02-28 ENCOUNTER — Other Ambulatory Visit: Payer: Self-pay | Admitting: Family Medicine

## 2018-02-28 DIAGNOSIS — R7309 Other abnormal glucose: Secondary | ICD-10-CM

## 2018-03-05 ENCOUNTER — Ambulatory Visit: Payer: BLUE CROSS/BLUE SHIELD | Admitting: Family Medicine

## 2018-03-14 ENCOUNTER — Encounter: Payer: Self-pay | Admitting: Family Medicine

## 2018-03-14 ENCOUNTER — Ambulatory Visit: Payer: BLUE CROSS/BLUE SHIELD | Admitting: Family Medicine

## 2018-03-14 VITALS — BP 122/84 | HR 72 | Temp 98.0°F | Wt 175.0 lb

## 2018-03-14 DIAGNOSIS — E782 Mixed hyperlipidemia: Secondary | ICD-10-CM | POA: Diagnosis not present

## 2018-03-14 DIAGNOSIS — E119 Type 2 diabetes mellitus without complications: Secondary | ICD-10-CM

## 2018-03-14 DIAGNOSIS — I10 Essential (primary) hypertension: Secondary | ICD-10-CM | POA: Diagnosis not present

## 2018-03-14 DIAGNOSIS — Z794 Long term (current) use of insulin: Secondary | ICD-10-CM | POA: Diagnosis not present

## 2018-03-14 NOTE — Progress Notes (Signed)
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 He's currently on Glucotrol 10 mg twice a day, metformin 1000 mg twice a day, and insulin 70-30 15 units daily with breakfast.  Blood sugar May 14 was 77 with an A1c of 7.7. Creatinine normal 0.75. So despite the fact that his A1c is 7.7 with a blood sugar of 77 I think I would leave his medication alone especially going summer months and the fact that he works outdoors.  He's had one episode of hypoglycemia. He did not have his meter with him but he ate something right away and felt better after the reports.  BP 122/84 (BP Location: Left Arm, Patient Position: Sitting, Cuff Size: Normal)   Pulse 72   Temp 98 F (36.7 C) (Oral)   Wt 175 lb (79.4 kg)   BMI 27.41 kg/m  Well-developed well-nourished male no acute distress vital signs stable is afebrile  #1 diabetes type 1.......... continue current meds...Marland KitchenMarland KitchenMarland Kitchen drink lots of water....... take his meter with him to work and if he feels like he might be developing hypoglycemia check a blood sugar immediately.  Instructions given on how to alter medication if the hypoglycemic episodes become more frequent during the hot summer months.

## 2018-03-14 NOTE — Patient Instructions (Signed)
Continue your current medications  Take your blood sugar monitor with you wherever you go. If you develop symptoms of hypoglycemia check your blood sugar immediately. If it's below 70 then take orange juice or any form of sugar to increase her blood sugar. At these episodes become more frequent during the hot summer months let us know and we will alter your medication.  Return September 18 for your annual physical examination  Labs one week prior

## 2018-06-28 ENCOUNTER — Telehealth: Payer: Self-pay | Admitting: Family Medicine

## 2018-06-28 MED ORDER — "INSULIN SYRINGE 27G X 1/2"" 0.5 ML MISC": 1.0000 | Freq: Every day | 1 refills | Status: DC

## 2018-06-28 NOTE — Addendum Note (Signed)
Addended by: Elie Confer on: 06/28/2018 02:18 PM   Modules accepted: Orders

## 2018-06-28 NOTE — Telephone Encounter (Signed)
Copied from [place] [phone] [id number]. Topic: General - Other >> Jun 28, 2018 11:37 AM Yvette Rack wrote: Reason for CRM: Pt requests a prescription for syringes as he was told by the pharmacist that the cost would be a lot cheaper if he had a prescription.

## 2018-07-01 ENCOUNTER — Other Ambulatory Visit (INDEPENDENT_AMBULATORY_CARE_PROVIDER_SITE_OTHER): Payer: BLUE CROSS/BLUE SHIELD

## 2018-07-01 DIAGNOSIS — E782 Mixed hyperlipidemia: Secondary | ICD-10-CM | POA: Diagnosis not present

## 2018-07-01 DIAGNOSIS — E119 Type 2 diabetes mellitus without complications: Secondary | ICD-10-CM

## 2018-07-01 DIAGNOSIS — Z794 Long term (current) use of insulin: Secondary | ICD-10-CM | POA: Diagnosis not present

## 2018-07-01 DIAGNOSIS — R7309 Other abnormal glucose: Secondary | ICD-10-CM

## 2018-07-01 LAB — BASIC METABOLIC PANEL
BUN: 22 mg/dL (ref 6–23)
CALCIUM: 9.2 mg/dL (ref 8.4–10.5)
CO2: 30 mEq/L (ref 19–32)
Chloride: 100 mEq/L (ref 96–112)
Creatinine, Ser: 0.81 mg/dL (ref 0.40–1.50)
GFR: 103.03 mL/min (ref >60.00)
GLUCOSE: 195 mg/dL — AB (ref 70–99)
Potassium: 3.6 mEq/L (ref 3.5–5.1)
Sodium: 139 mEq/L (ref 135–145)

## 2018-07-01 LAB — MICROALBUMIN / CREATININE URINE RATIO
Creatinine,U: 261.1 mg/dL
Microalb Creat Ratio: 0.7 mg/g (ref 0.0–30.0)
Microalb, Ur: 1.9 mg/dL (ref 0.0–1.9)

## 2018-07-01 LAB — PSA: PSA: 0.45 ng/mL (ref 0.10–4.00)

## 2018-07-01 LAB — CBC WITH DIFFERENTIAL/PLATELET
BASOS ABS: 0 10*3/uL (ref 0.0–0.1)
Basophils Relative: 0.7 % (ref 0.0–3.0)
Eosinophils Absolute: 0.1 10*3/uL (ref 0.0–0.7)
Eosinophils Relative: 1.8 % (ref 0.0–5.0)
HEMATOCRIT: 44.7 % (ref 39.0–52.0)
HEMOGLOBIN: 15.5 g/dL (ref 13.0–17.0)
LYMPHS PCT: 24.6 % (ref 12.0–46.0)
Lymphs Abs: 1.7 10*3/uL (ref 0.7–4.0)
MCHC: 34.7 g/dL (ref 30.0–36.0)
MCV: 89.5 fl (ref 78.0–100.0)
MONOS PCT: 8.2 % (ref 3.0–12.0)
Monocytes Absolute: 0.6 10*3/uL (ref 0.1–1.0)
NEUTROS ABS: 4.4 10*3/uL (ref 1.4–7.7)
Neutrophils Relative %: 64.7 % (ref 43.0–77.0)
PLATELETS: 219 10*3/uL (ref 150.0–400.0)
RBC: 4.99 Mil/uL (ref 4.22–5.81)
RDW: 13.8 % (ref 11.5–15.5)
WBC: 6.8 10*3/uL (ref 4.0–10.5)

## 2018-07-01 LAB — HEPATIC FUNCTION PANEL
ALT: 31 U/L (ref 0–53)
AST: 18 U/L (ref 0–37)
Albumin: 4.3 g/dL (ref 3.5–5.2)
Alkaline Phosphatase: 71 U/L (ref 39–117)
BILIRUBIN DIRECT: 0.1 mg/dL (ref 0.0–0.3)
TOTAL PROTEIN: 6.7 g/dL (ref 6.0–8.3)
Total Bilirubin: 0.7 mg/dL (ref 0.2–1.2)

## 2018-07-01 LAB — LIPID PANEL
CHOL/HDL RATIO: 4
Cholesterol: 122 mg/dL (ref 0–200)
HDL: 28.3 mg/dL — ABNORMAL LOW (ref >39.00)
LDL Cholesterol: 63 mg/dL (ref 0–99)
NONHDL: 93.31
Triglycerides: 152 mg/dL — ABNORMAL HIGH (ref 0.0–149.0)
VLDL: 30.4 mg/dL (ref 0.0–40.0)

## 2018-07-01 LAB — TSH: TSH: 1.99 u[IU]/mL (ref 0.35–4.50)

## 2018-07-01 LAB — HEMOGLOBIN A1C: Hgb A1c MFr Bld: 10.5 % — ABNORMAL HIGH (ref 4.6–6.5)

## 2018-07-02 ENCOUNTER — Other Ambulatory Visit: Payer: Self-pay | Admitting: Family Medicine

## 2018-07-02 DIAGNOSIS — E119 Type 2 diabetes mellitus without complications: Secondary | ICD-10-CM

## 2018-07-02 DIAGNOSIS — Z794 Long term (current) use of insulin: Principal | ICD-10-CM

## 2018-07-03 ENCOUNTER — Encounter: Payer: BLUE CROSS/BLUE SHIELD | Admitting: Family Medicine

## 2018-07-06 ENCOUNTER — Other Ambulatory Visit: Payer: Self-pay | Admitting: Family Medicine

## 2018-07-06 DIAGNOSIS — I10 Essential (primary) hypertension: Secondary | ICD-10-CM

## 2018-07-09 ENCOUNTER — Encounter: Payer: Self-pay | Admitting: Family Medicine

## 2018-07-09 ENCOUNTER — Ambulatory Visit (INDEPENDENT_AMBULATORY_CARE_PROVIDER_SITE_OTHER): Payer: BLUE CROSS/BLUE SHIELD | Admitting: Family Medicine

## 2018-07-09 VITALS — BP 120/78 | HR 65 | Temp 98.6°F | Ht 67.0 in | Wt 174.6 lb

## 2018-07-09 DIAGNOSIS — E119 Type 2 diabetes mellitus without complications: Secondary | ICD-10-CM

## 2018-07-09 DIAGNOSIS — Z794 Long term (current) use of insulin: Secondary | ICD-10-CM

## 2018-07-09 DIAGNOSIS — Z23 Encounter for immunization: Secondary | ICD-10-CM

## 2018-07-09 DIAGNOSIS — I10 Essential (primary) hypertension: Secondary | ICD-10-CM | POA: Diagnosis not present

## 2018-07-09 DIAGNOSIS — N529 Male erectile dysfunction, unspecified: Secondary | ICD-10-CM | POA: Diagnosis not present

## 2018-07-09 DIAGNOSIS — E782 Mixed hyperlipidemia: Secondary | ICD-10-CM

## 2018-07-09 DIAGNOSIS — Z0000 Encounter for general adult medical examination without abnormal findings: Secondary | ICD-10-CM

## 2018-07-09 MED ORDER — ATENOLOL-CHLORTHALIDONE 50-25 MG PO TABS: 1.0000 | ORAL_TABLET | Freq: Every day | ORAL | 4 refills | Status: DC

## 2018-07-09 MED ORDER — LISINOPRIL 5 MG PO TABS: ORAL_TABLET | ORAL | 3 refills | Status: DC

## 2018-07-09 MED ORDER — METFORMIN HCL 1000 MG PO TABS: 1000.0000 mg | ORAL_TABLET | Freq: Two times a day (BID) | ORAL | 4 refills | Status: DC

## 2018-07-09 MED ORDER — INSULIN ASPART PROT & ASPART (70-30 MIX) 100 UNIT/ML ~~LOC~~ SUSP: 15.0000 [IU] | Freq: Every day | SUBCUTANEOUS | 6 refills | Status: DC

## 2018-07-09 MED ORDER — SILDENAFIL CITRATE 20 MG PO TABS: ORAL_TABLET | ORAL | 11 refills | Status: DC

## 2018-07-09 MED ORDER — ATORVASTATIN CALCIUM 80 MG PO TABS: ORAL_TABLET | ORAL | 4 refills | Status: DC

## 2018-07-09 NOTE — Progress Notes (Signed)
[name] is a 62 year old married male non-smoker...Tyler KitchenMarland KitchenMarland Wolf x1 month...... 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  BP today 121/76  He takes an aspirin tablet and 80 mg of Lipitor at bedtime for hyperlipidemia.  Recent LDL September 16 was 63  For his diabetes type 1 he takes insulin 15 units in the morning, 10 mg of Glucotrol before breakfast, and 1000 mg of metformin twice daily.  His blood sugars are in the 195 range and his A1c is jumped up to 10.5.  We have set him up to consult and endocrinology.  He has an appointment late October.  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  His weight is good at 174.  He gets routine eye care, dental care, colonoscopy 2011 was normal.  Vaccination seasonal flu shot given today information given on the shingles vaccine  14 point review of system reviewed otherwise negative  He is only checking a fasting blood sugar once a month  Social history........Tyler Wolf married lives in [place] works in Higher education careers adviser.  BP 120/78 (BP Location: Right Arm, Patient Position: Sitting, Cuff Size: Large)   Pulse 65   Temp 98.6 F (37 C) (Oral)   Ht 5\' 7"  (1.702 m)   Wt 174 lb 9.6 oz (79.2 kg)   SpO2 96%   BMI 27.35 kg/m  Well-developed well-nourished male no acute distress vital signs stable he is afebrile HEENT were negative neck was supple thyroid is not enlarged no carotid bruits.  Cardiopulmonary exam normal.  Abdominal exam normal.  Genitalia normal circumcised male.  Rectal normal stool guaiac negative prostate normal extremities normal skin normal peripheral pulses normal  1.  Hypertension at goal  2.  Hyperlipidemia at goal  3.  Diabetes type 1.......... not at goal,,,,,,,, see outline of treatment program  4.  Mild ED.......Tyler Wolf refill  generic Viagra

## 2018-07-09 NOTE — Patient Instructions (Signed)
Continue current medications except increase your insulin to 20 units daily and monitor a fasting blood sugar daily.  Once your fasting blood sugar is 120 then maintain that dose of insulin  Check a fasting blood sugar daily and keep a good record to take to the endocrinologist  Carbohydrate free diet

## 2018-07-14 ENCOUNTER — Other Ambulatory Visit: Payer: Self-pay | Admitting: Family Medicine

## 2018-07-22 ENCOUNTER — Ambulatory Visit (INDEPENDENT_AMBULATORY_CARE_PROVIDER_SITE_OTHER): Payer: BLUE CROSS/BLUE SHIELD | Admitting: Endocrinology

## 2018-07-22 ENCOUNTER — Encounter: Payer: Self-pay | Admitting: Endocrinology

## 2018-07-22 VITALS — BP 130/74 | HR 75 | Ht 67.0 in | Wt 175.2 lb

## 2018-07-22 DIAGNOSIS — E119 Type 2 diabetes mellitus without complications: Secondary | ICD-10-CM | POA: Diagnosis not present

## 2018-07-22 DIAGNOSIS — Z794 Long term (current) use of insulin: Secondary | ICD-10-CM

## 2018-07-22 MED ORDER — INSULIN GLARGINE 100 UNIT/ML SOLOSTAR PEN: 25.0000 [IU] | PEN_INJECTOR | SUBCUTANEOUS | 99 refills | Status: DC

## 2018-07-22 NOTE — Progress Notes (Signed)
Subjective:    Patient ID: Tyler Wolf, male    DOB: 01/07/1956, 62 y.o.   MRN: [id number]  HPI pt is referred by Dr Sherren Mocha, for diabetes.  Pt states DM was dx'ed in 2009; he has mild if any neuropathy of the lower extremities; he is unaware of any associated chronic complications; he has been on insulin since 2017; pt says his diet and exercise are good; he has never had pancreatitis, pancreatic surgery, severe hypoglycemia or DKA.  He takes novolog 70/30 (15 units qam), glipizide, and metformin.  Pt says he sometimes misses his insulin, due to cost.  Pt says when he takes meds as rx'ed, cbg's are in the mid-100's. He wants to use up current insulin before starting any new.   Past Medical History:  Diagnosis Date  . DIABETES MELLITUS, TYPE II 05/17/2007  . DIVERTICULITIS, ACUTE 08/17/2010  . 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 HYPERLIPIDEMIA, MIXED 05/27/2007  . HYPERTENSION 05/17/2007  . TOBACCO ABUSE 05/27/2007    No past surgical history on file.  Social History   Socioeconomic History  . Marital status: Married    Spouse name: Not on file  . Number of children: Not on file  . Years of education: Not on file  . Highest education level: Not on file  Occupational History  . Not on file  Social Needs  . Financial resource strain: Not on file  . Food insecurity:    Worry: Not on file    Inability: Not on file  . Transportation needs:    Medical: Not on file    Non-medical: Not on file  Tobacco Use  . Smoking status: Current Some Day Smoker    Packs/day: 0.50  . Smokeless tobacco: Never Used  . Tobacco comment: electronic cigarettes  Substance and Sexual Activity  . Alcohol use: No    Alcohol/week: 0.0 standard drinks  . Drug use: No  . Sexual activity: Not on file  Lifestyle  . Physical activity:    Days per week: Not on file    Minutes per session: Not on file  . Stress: Not on file  Relationships  . Social connections:    Talks on phone: Not on file    Gets  together: Not on file    Attends religious service: Not on file    Active member of club or organization: Not on file    Attends meetings of clubs or organizations: Not on file    Relationship status: Not on file  . Intimate partner violence:    Fear of current or ex partner: Not on file    Emotionally abused: Not on file    Physically abused: Not on file    Forced sexual activity: Not on file  Other Topics Concern  . Not on file  Social History Narrative  . Not on file    Current Outpatient Medications on File Prior to Visit  Medication Sig Dispense Refill  . aspirin 81 MG tablet Take 81 mg by mouth daily.      Marland Kitchen atenolol-chlorthalidone (TENORETIC) 50-25 MG tablet Take 1 tablet by mouth daily. 90 tablet 4  . atorvastatin (LIPITOR) 80 MG tablet TAKE 1 TABLET (80 MG TOTAL) BY MOUTH NIGHTLY 100 tablet 4  . Insulin Syringe 27G X 1/2" 0.5 ML MISC 1 Syringe by Does not apply route daily. 100 each 1  . lisinopril (PRINIVIL,ZESTRIL) 5 MG tablet TAKE 1 TABLET (5 MG TOTAL) BY MOUTH DAILY. 100 tablet 3  .  metFORMIN (GLUCOPHAGE) 1000 MG tablet Take 1 tablet (1,000 mg total) by mouth 2 (two) times daily. 200 tablet 4  . sildenafil (REVATIO) 20 MG tablet 1 to 2 tablets 3 hours prior to sex 30 tablet 11   No current facility-administered medications on file prior to visit.     Allergies  Allergen Reactions  . Latex     REACTION: Rash    Family History  Problem Relation Age of Onset  . Diabetes Neg Hx        1st degree relative     BP 130/74 (BP Location: Right Arm)   Pulse 75   Ht 5\' 7"  (1.702 m)   Wt 175 lb 3.2 oz (79.5 kg)   SpO2 97%   BMI 27.44 kg/m     Review of Systems denies weight loss, blurry vision, headache, chest pain, sob, n/v, urinary frequency, muscle cramps, excessive diaphoresis, memory loss, depression, cold intolerance, rhinorrhea, and easy bruising.   He has intermitt leg cramps.      Objective:   Physical Exam VS: see vs page GEN: no distress HEAD:  head: no deformity eyes: no periorbital swelling, no proptosis external nose and ears are normal mouth: no lesion seen NECK: supple, thyroid is not enlarged CHEST WALL: no deformity LUNGS: clear to auscultation CV: reg rate and rhythm, no murmur ABD: abdomen is soft, nontender.  no hepatosplenomegaly.  not distended.  no hernia MUSCULOSKELETAL: muscle bulk and strength are grossly normal.  no obvious joint swelling.  gait is normal and steady EXTEMITIES: no deformity.  no ulcer on the feet.  feet are of normal color and temp.  no edema.  There is bilateral onychomycosis of the toenails, and heavy calluses.   PULSES: dorsalis pedis intact bilat.  no carotid bruit NEURO:  cn 2-12 grossly intact.   readily moves all 4's.  sensation is intact to touch on the feet SKIN:  Normal texture and temperature.  No rash or suspicious lesion is visible.   NODES:  None palpable at the neck PSYCH: alert, well-oriented.  Does not appear anxious nor depressed.   Lab Results  Component Value Date   HGBA1C 10.5 (H) 07/01/2018   Lab Results  Component Value Date   CREATININE 0.81 07/01/2018   BUN 22 07/01/2018   NA 139 07/01/2018   K 3.6 07/01/2018   CL 100 07/01/2018   CO2 30 07/01/2018   I have reviewed outside records, and summarized: Pt was noted to have elevated a1c, and referred here.  Other probs addressed were wellness, HTN, ED sxs, and dyslipidemia  I personally reviewed electrocardiogram tracing (today): Indication: DM Impression: NSR.  No MI.  No hypertrophy. Lateral T-wave flattening, which is nonspecific Compared to 2018: no significant change     Assessment & Plan:  Insulin-requiring type 2 DM: severe exacerbation.  We discussed.  He'll use up current insulin, by increasing to 25 units qam   Patient Instructions  good diet and exercise significantly improve the control of your diabetes.  please let me know if you wish to be referred to a dietician.  high blood sugar is very risky  to your health.  you should see an eye doctor and dentist every year.  It is very important to get all recommended vaccinations.  Controlling your blood pressure and cholesterol drastically reduces the damage diabetes does to your body.  Those who smoke should quit.  Please discuss these with your doctor.  check your blood sugar twice a day.  vary  the time of day when you check, between before the 3 meals, and at bedtime.  also check if you have symptoms of your blood sugar being too high or too low.  please keep a record of the readings and bring it to your next appointment here (or you can bring the meter itself).  You can write it on any piece of paper.  please call us sooner if your blood sugar goes below 70, or if you have a lot of readings over 200. I have sent a prescription to your pharmacy, to change the insulin to lantus, 25 units each morning. Please stop taking the glipizide, and: Please continue the same metformin.   Please come back for a follow-up appointment in 2 months.

## 2018-07-22 NOTE — Patient Instructions (Addendum)
good diet and exercise significantly improve the control of your diabetes.  please let me know if you wish to be referred to a dietician.  high blood sugar is very risky to your health.  you should see an eye doctor and dentist every year.  It is very important to get all recommended vaccinations.  Controlling your blood pressure and cholesterol drastically reduces the damage diabetes does to your body.  Those who smoke should quit.  Please discuss these with your doctor.  check your blood sugar twice a day.  vary the time of day when you check, between before the 3 meals, and at bedtime.  also check if you have symptoms of your blood sugar being too high or too low.  please keep a record of the readings and bring it to your next appointment here (or you can bring the meter itself).  You can write it on any piece of paper.  please call us sooner if your blood sugar goes below 70, or if you have a lot of readings over 200. I have sent a prescription to your pharmacy, to change the insulin to lantus, 25 units each morning. Please stop taking the glipizide, and: Please continue the same metformin.   Please come back for a follow-up appointment in 2 months.

## 2018-08-27 ENCOUNTER — Encounter: Payer: Self-pay | Admitting: Adult Health

## 2018-08-27 ENCOUNTER — Ambulatory Visit: Payer: BLUE CROSS/BLUE SHIELD | Admitting: Adult Health

## 2018-08-27 VITALS — BP 128/83 | HR 67 | Temp 98.5°F | Ht 67.0 in | Wt 173.4 lb

## 2018-08-27 DIAGNOSIS — Z794 Long term (current) use of insulin: Secondary | ICD-10-CM

## 2018-08-27 DIAGNOSIS — Z23 Encounter for immunization: Secondary | ICD-10-CM | POA: Diagnosis not present

## 2018-08-27 DIAGNOSIS — E119 Type 2 diabetes mellitus without complications: Secondary | ICD-10-CM

## 2018-08-27 DIAGNOSIS — I10 Essential (primary) hypertension: Secondary | ICD-10-CM

## 2018-08-27 DIAGNOSIS — E782 Mixed hyperlipidemia: Secondary | ICD-10-CM | POA: Diagnosis not present

## 2018-08-27 DIAGNOSIS — Z0000 Encounter for general adult medical examination without abnormal findings: Secondary | ICD-10-CM

## 2018-08-27 MED ORDER — NICOTINE 14 MG/24HR TD PT24: 14.0000 mg | MEDICATED_PATCH | Freq: Every day | TRANSDERMAL | 0 refills | Status: DC

## 2018-08-27 NOTE — Assessment & Plan Note (Signed)
Continue all medications as directed. When you needs refills on non-diabetic medications, please call pharmacy and they will contact our clinic. Please use Nicoderm patch as directed, you can stop tobacco use! Follow diabetic diet and regular follow-up with Dr. Loanne Drilling. Please schedule follow-up here in 3 months.

## 2018-08-27 NOTE — Patient Instructions (Addendum)

## 2018-08-27 NOTE — Progress Notes (Signed)
Subjective:    Patient ID: Tyler Wolf, male    DOB: Jun 18, 1956, 62 y.o.   MRN: [id number]  HPI:  Tyler Wolf is here to establish as a new pt.  He is a pleasant 62 year old male. PMH: T2D, HLD, HTN, ED,  He recently established with Dr. 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 He estimates to drink >75 oz water/day, 24 oz coffee/day He denies ETOH use He smokes 1/4 pack/day, has been using tobacco >48 years, he is agreeable to Nicoderm patch He reports using Sildenafil 20mg  >10 years, denies SE Overall her feels that his health is "fair"  Patient Care Team    Relationship Specialty Notifications Start End  Mina Marble D, NP PCP - General Family Medicine  08/27/18   Dorena Cookey, MD Consulting Physician Family Medicine  08/27/18 08/27/18    Patient Active Problem List   Diagnosis Date Noted  . Popliteal cyst, left 07/03/2017  . Diabetes ([place]) 06/04/2014  . HYPERLIPIDEMIA, MIXED 05/27/2007  . 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 Essential hypertension 05/17/2007     Past Medical History:  Diagnosis Date  . DIABETES MELLITUS, TYPE II 05/17/2007  . DIVERTICULITIS, ACUTE 08/17/2010  . 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 HYPERLIPIDEMIA, MIXED 05/27/2007  . HYPERTENSION 05/17/2007  . TOBACCO ABUSE 05/27/2007     History reviewed. No pertinent surgical history.   Family History  Problem Relation Age of Onset  . Diabetes Mother   . Heart attack Father   . Diabetes Father      Social History   Substance and Sexual Activity  Drug Use No     Social History   Substance and Sexual Activity  Alcohol Use No   . Alcohol/week: 0.0 standard drinks     Social History   Tobacco Use  Smoking Status Current Some Day Smoker  . Packs/day: 0.25  . Years: 40.00  . Pack years: 10.00  Smokeless Tobacco Never Used  Tobacco Comment   electronic cigarettes     Outpatient Encounter Medications as of 08/27/2018  Medication Sig  . aspirin 81 MG tablet Take 81 mg by mouth daily.    Marland Kitchen atenolol-chlorthalidone (TENORETIC) 50-25 MG tablet Take 1 tablet by mouth daily.  Marland Kitchen atorvastatin (LIPITOR) 80 MG tablet TAKE 1 TABLET (80 MG TOTAL) BY MOUTH NIGHTLY  . Insulin Glargine (LANTUS SOLOSTAR) 100 UNIT/ML Solostar Pen Inject 25 Units into the skin every morning. And pen needles 1/day  . Insulin Syringe 27G X 1/2" 0.5 ML MISC 1 Syringe by Does not apply route daily.  Marland Kitchen lisinopril (PRINIVIL,ZESTRIL) 5 MG tablet TAKE 1 TABLET (5 MG TOTAL) BY MOUTH DAILY.  . metFORMIN (GLUCOPHAGE) 1000 MG tablet Take 1 tablet (1,000 mg total) by mouth 2 (two) times daily.  . [DISCONTINUED] sildenafil (REVATIO) 20 MG tablet 1 to 2 tablets 3 hours prior to sex   No facility-administered encounter medications on file as of 08/27/2018.     Allergies: Latex  Body mass index is 27.16 kg/m.  Blood pressure (!) 144/86, pulse 67, temperature 98.5 F (36.9 C), temperature source Oral, height 5\' 7"  (1.702 m), weight 173 lb 6.4  oz (78.7 kg), SpO2 97 %.  Review of Systems  Constitutional: Positive for fatigue. Negative for activity change, appetite change, chills, diaphoresis, fever and unexpected weight change.  Eyes: Negative for visual disturbance.  Respiratory: Negative for cough, chest tightness, shortness of breath, wheezing and stridor.   Cardiovascular: Negative for chest pain, palpitations and leg swelling.  Gastrointestinal: Negative for abdominal distention, anal bleeding, blood in stool, constipation, diarrhea, nausea and vomiting.  Genitourinary: Negative for difficulty urinating and flank pain.  Musculoskeletal: Negative  for arthralgias, back pain, gait problem, joint swelling, myalgias, neck pain and neck stiffness.  Skin: Negative for color change, pallor, rash and wound.  Neurological: Negative for dizziness and headaches.  Hematological: Does not bruise/bleed easily.  Psychiatric/Behavioral: Negative for behavioral problems, confusion, decreased concentration, dysphoric mood, hallucinations, self-injury, sleep disturbance and suicidal ideas. The patient is not nervous/anxious and is not hyperactive.        Objective:   Physical Exam  Constitutional: He is oriented to person, place, and time. He appears well-developed and well-nourished. No distress.  HENT:  Head: Normocephalic and atraumatic.  Right Ear: External ear normal.  Left Ear: External ear normal.  Nose: Nose normal.  Mouth/Throat: Oropharynx is clear and moist.  Eyes: Pupils are equal, round, and reactive to light. Conjunctivae and EOM are normal.  Cardiovascular: Normal rate, regular rhythm, normal heart sounds and intact distal pulses.  No murmur heard. Pulmonary/Chest: Effort normal and breath sounds normal. No stridor. No respiratory distress. He has no wheezes. He has no rales. He exhibits no tenderness.  Neurological: He is alert and oriented to person, place, and time.  Skin: Skin is warm and dry. Capillary refill takes less than 2 seconds. No rash noted. He is not diaphoretic. No erythema.  Psychiatric: He has a normal mood and affect. His behavior is normal. Judgment and thought content normal.  Nursing note and vitals reviewed.     Assessment & Plan:   1. Need for zoster vaccination   2. Essential hypertension   3. Healthcare maintenance   4. Type 2 diabetes mellitus without complication, with long-term current use of insulin ([place])   5. HYPERLIPIDEMIA, MIXED     Diabetes ([place]) Lab Results  Component Value Date   HGBA1C 10.5 (H) 07/01/2018   HGBA1C 7.7 (H) 02/26/2018   HGBA1C 8.5 (H) 11/19/2017  He is now on Metformin  1000mg  BID and Lantus 25 U daily He reports AM BS 130-140s, denies episodes of hypoglycemia Followed by Dr. Rocco Pauls, has f/u Dec 2019  Healthcare maintenance Continue all medications as directed. When you needs refills on non-diabetic medications, please call pharmacy and they will contact our clinic. Please use Nicoderm patch as directed, you can stop tobacco use! Follow diabetic diet and regular follow-up with Dr. Loanne Drilling. Please schedule follow-up here in 3 months.  HYPERLIPIDEMIA, MIXED 07/2018 Tot chol 122 TGs 152 HDL 28 LDL 63 Currently on Atorvastatin 80mg     FOLLOW-UP:  Return in about 3 months (around 11/27/2018) for Regular Follow Up, HTN, Diabetes.

## 2018-08-27 NOTE — Assessment & Plan Note (Signed)
07/2018 Tot chol 122 TGs 152 HDL 28 LDL 63 Currently on Atorvastatin 80mg 

## 2018-08-27 NOTE — Assessment & Plan Note (Signed)
>>  ASSESSMENT AND PLAN FOR HYPERLIPIDEMIA, MIXED WRITTEN ON 08/27/2018 10:06 AM BY DANFORD, KATY D, NP  07/2018 Tot chol 122 TGs 152 HDL 28 LDL 63 Currently on Atorvastatin 80mg

## 2018-08-27 NOTE — Assessment & Plan Note (Addendum)
Lab Results  Component Value Date   HGBA1C 10.5 (H) 07/01/2018   HGBA1C 7.7 (H) 02/26/2018   HGBA1C 8.5 (H) 11/19/2017  He is now on Metformin 1000mg  BID and Lantus 25 U daily He reports AM BS 130-140s, denies episodes of hypoglycemia Followed by Dr. Rocco Pauls, has f/u Dec 2019

## 2018-09-23 ENCOUNTER — Ambulatory Visit: Payer: BLUE CROSS/BLUE SHIELD | Admitting: Endocrinology

## 2018-09-23 ENCOUNTER — Encounter: Payer: Self-pay | Admitting: Endocrinology

## 2018-09-23 VITALS — BP 122/78 | HR 62 | Ht 67.0 in | Wt 173.4 lb

## 2018-09-23 DIAGNOSIS — Z794 Long term (current) use of insulin: Secondary | ICD-10-CM | POA: Diagnosis not present

## 2018-09-23 DIAGNOSIS — E119 Type 2 diabetes mellitus without complications: Secondary | ICD-10-CM | POA: Diagnosis not present

## 2018-09-23 LAB — POCT GLYCOSYLATED HEMOGLOBIN (HGB A1C): HEMOGLOBIN A1C: 10 % — AB (ref 4.0–5.6)

## 2018-09-23 MED ORDER — INSULIN GLARGINE 100 UNIT/ML SOLOSTAR PEN: 25.0000 [IU] | PEN_INJECTOR | SUBCUTANEOUS | 99 refills | Status: DC

## 2018-09-23 NOTE — Patient Instructions (Addendum)
check your blood sugar twice a day.  vary the time of day when you check, between before the 3 meals, and at bedtime.  also check if you have symptoms of your blood sugar being too high or too low.  please keep a record of the readings and bring it to your next appointment here (or you can bring the meter itself).  You can write it on any piece of paper.  please call us sooner if your blood sugar goes below 70, or if you have a lot of readings over 200. I have sent a prescription to your pharmacy, to take lantus, 25 units each morning.  Here is a discount card.   Please continue the same metformin.  Please come back for a follow-up appointment in 2 months.

## 2018-09-23 NOTE — Progress Notes (Signed)
Subjective:    Patient ID: Tyler Wolf, male    DOB: [birth year], 62 y.o.   MRN: [id number]  HPI Pt returns for f/u of diabetes mellitus: DM type: Insulin-requiring type 2 Dx'ed: 9242 Complications: none Therapy: insulin since 2017 DKA: never Severe hypoglycemia: never Pancreatitis: never Pancreatic imaging: normal on 2007 CT Other: due to missing insulin, he is not a candidate for multiple daily injections Interval history: Pt says he often misses the insulin, due to running out of syringes.  He seldom checks cbg.  pt states he feels well in general.   Past Medical History:  Diagnosis Date  . DIABETES MELLITUS, TYPE II 05/17/2007  . DIVERTICULITIS, ACUTE 08/17/2010  . 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 HYPERLIPIDEMIA, MIXED 05/27/2007  . HYPERTENSION 05/17/2007  . TOBACCO ABUSE 05/27/2007    No past surgical history on file.  Social History   Socioeconomic History  . Marital status: Married    Spouse name: Not on file  . Number of children: Not on file  . Years of education: Not on file  . Highest education level: Not on file  Occupational History  . Not on file  Social Needs  . Financial resource strain: Not on file  . Food insecurity:    Worry: Not on file    Inability: Not on file  . Transportation needs:    Medical: Not on file    Non-medical: Not on file  Tobacco Use  . Smoking status: Current Some Day Smoker    Packs/day: 0.25    Years: 40.00    Pack years: 10.00  . Smokeless tobacco: Never Used  . Tobacco comment: electronic cigarettes  Substance and Sexual Activity  . Alcohol use: No    Alcohol/week: 0.0 standard drinks  . Drug use: No  . Sexual activity: Not Currently    Birth control/protection: None  Lifestyle  . Physical activity:    Days per week: Not on file    Minutes per session: Not on file  . Stress: Not on file  Relationships  . Social connections:    Talks on phone: Not on file    Gets together: Not on file    Attends  religious service: Not on file    Active member of club or organization: Not on file    Attends meetings of clubs or organizations: Not on file    Relationship status: Not on file  . Intimate partner violence:    Fear of current or ex partner: Not on file    Emotionally abused: Not on file    Physically abused: Not on file    Forced sexual activity: Not on file  Other Topics Concern  . Not on file  Social History Narrative  . Not on file    Current Outpatient Medications on File Prior to Visit  Medication Sig Dispense Refill  . aspirin 81 MG tablet Take 81 mg by mouth daily.      Marland Kitchen atenolol-chlorthalidone (TENORETIC) 50-25 MG tablet Take 1 tablet by mouth daily. 90 tablet 4  . atorvastatin (LIPITOR) 80 MG tablet TAKE 1 TABLET (80 MG TOTAL) BY MOUTH NIGHTLY 100 tablet 4  . Insulin Syringe 27G X 1/2" 0.5 ML MISC 1 Syringe by Does not apply route daily. 100 each 1  . lisinopril (PRINIVIL,ZESTRIL) 5 MG tablet TAKE 1 TABLET (5 MG TOTAL) BY MOUTH DAILY. 100 tablet 3  . metFORMIN (GLUCOPHAGE) 1000 MG tablet Take 1 tablet (1,000 mg total) by mouth 2 (two) times  daily. 200 tablet 4  . nicotine (NICODERM CQ - DOSED IN MG/24 HOURS) 14 mg/24hr patch Place 1 patch (14 mg total) onto the skin daily. 28 patch 0   No current facility-administered medications on file prior to visit.     Allergies  Allergen Reactions  . Latex     REACTION: Rash    Family History  Problem Relation Age of Onset  . Diabetes Mother   . Heart attack Father   . Diabetes Father     BP 122/78 (BP Location: Left Arm, Patient Position: Sitting, Cuff Size: Normal)   Pulse 62   Ht 5\' 7"  (1.702 m)   Wt 173 lb 6.4 oz (78.7 kg)   SpO2 98%   BMI 27.16 kg/m    Review of Systems He denies hypoglycemia    Objective:   Physical Exam VITAL SIGNS:  See vs page GENERAL: no distress Pulses: dorsalis pedis intact bilat.   MSK: no deformity of the feet CV: no leg edema Skin:  no ulcer on the feet, but there are heavy  calluses.  normal color and temp on the feet. Neuro: sensation is intact to touch on the feet Ext: There is bilateral onychomycosis of the toenails   Lab Results  Component Value Date   HGBA1C 10.0 (A) 09/23/2018       Assessment & Plan:  Insulin-requiring type 2 DM: he needs increased rx Missing insulin: worse: he should stay with QD insulin.   Patient Instructions  check your blood sugar twice a day.  vary the time of day when you check, between before the 3 meals, and at bedtime.  also check if you have symptoms of your blood sugar being too high or too low.  please keep a record of the readings and bring it to your next appointment here (or you can bring the meter itself).  You can write it on any piece of paper.  please call us sooner if your blood sugar goes below 70, or if you have a lot of readings over 200. I have sent a prescription to your pharmacy, to take lantus, 25 units each morning.  Here is a discount card.   Please continue the same metformin.  Please come back for a follow-up appointment in 2 months.

## 2018-11-11 DIAGNOSIS — E119 Type 2 diabetes mellitus without complications: Secondary | ICD-10-CM | POA: Diagnosis not present

## 2018-11-11 DIAGNOSIS — H2513 Age-related nuclear cataract, bilateral: Secondary | ICD-10-CM | POA: Diagnosis not present

## 2018-11-11 LAB — HM DIABETES EYE EXAM

## 2018-11-20 NOTE — Progress Notes (Signed)
Subjective:    Patient ID: Tyler Wolf, male    DOB: 08-30-1956, 63 y.o.   MRN: [id number]  HPI: 08/28/2019 OV:  Tyler Wolf is here to establish as a new pt.  He is a pleasant 63 year old male. PMH: T2D, HLD, HTN, ED,  He recently established with Dr. 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 He estimates to drink >75 oz water/day, 24 oz coffee/day He denies ETOH use He smokes 1/4 pack/day, has been using tobacco >48 years, he is agreeable to Nicoderm patch He reports using Sildenafil 20mg  >10 years, denies SE Overall her feels that his health is "fair"  11/27/2018 OV: Tyler Wolf is here for 3 month f/u: T2D, HLD, HTN He stopped tobacco use 3 weeks ago- GREAT JOB! He reports AM BS 200-270 He reports forgetting to take metformin and lantus as least 1-2 times per week due to fluctuating work schedule He reports reducing sugar in diet and rarely eats CHO at evening meal- mostly vegetables and protein  He has upcoming OV with Endo/Dr. Garnette Scheuermann 12/02/2018, hopefully A1c will be much improved, last 09/23/2018- 10.0 He denies CP/dyspnea/dizziness/HA/palpitations He estimates to drink 30-40 oz plain water, prefers to hydrate with unsweetened tea  Patient Care Team    Relationship Specialty Notifications Start End  Esaw Grandchild, NP PCP - General Family Medicine  08/27/18     Patient Active Problem List   Diagnosis Date Noted  . Healthcare maintenance 08/27/2018  . Popliteal cyst, left 07/03/2017  . Diabetes ([place]) 06/04/2014  . HYPERLIPIDEMIA, MIXED 05/27/2007  . 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 Essential hypertension  05/17/2007     Past Medical History:  Diagnosis Date  . DIABETES MELLITUS, TYPE II 05/17/2007  . DIVERTICULITIS, ACUTE 08/17/2010  . 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 HYPERLIPIDEMIA, MIXED 05/27/2007  . HYPERTENSION 05/17/2007  . TOBACCO ABUSE 05/27/2007     History reviewed. No pertinent surgical history.   Family History  Problem Relation Age of Onset  . Diabetes Mother   . Heart attack Father   . Diabetes Father      Social History   Substance and Sexual Activity  Drug Use No     Social History   Substance and Sexual Activity  Alcohol Use No  . Alcohol/week: 0.0 standard drinks     Social History   Tobacco Use  Smoking Status Former Smoker  . Packs/day: 0.25  . Years: 40.00  . Pack years: 10.00  . Last attempt to quit: 11/06/2018  . Years since quitting: 0.0  Smokeless Tobacco Never Used  Tobacco Comment   electronic cigarettes     Outpatient Encounter Medications as of 11/27/2018  Medication Sig  . aspirin 81 MG tablet Take 81 mg by mouth daily.    Marland Kitchen atenolol-chlorthalidone (TENORETIC) 50-25 MG tablet Take 1 tablet by mouth daily.  Marland Kitchen atorvastatin (LIPITOR) 80 MG tablet TAKE 1 TABLET (80 MG TOTAL) BY MOUTH NIGHTLY  . Insulin Glargine (LANTUS SOLOSTAR) 100 UNIT/ML Solostar Pen Inject 25 Units into the skin every morning. And pen needles 1/day  . Insulin Syringe 27G X 1/2" 0.5 ML MISC 1 Syringe by  Does not apply route daily.  Marland Kitchen lisinopril (PRINIVIL,ZESTRIL) 5 MG tablet TAKE 1 TABLET (5 MG TOTAL) BY MOUTH DAILY.  . metFORMIN (GLUCOPHAGE) 1000 MG tablet Take 1 tablet (1,000 mg total) by mouth 2 (two) times daily.  . [DISCONTINUED] nicotine (NICODERM CQ - DOSED IN MG/24 HOURS) 14 mg/24hr patch Place 1 patch (14 mg total) onto the skin daily.   No facility-administered encounter medications on file as of 11/27/2018.     Allergies: Latex  Body mass index is 26.94 kg/m.  Blood pressure 123/70, pulse 62, temperature 98.3 F (36.8 C), temperature  source Oral, height 5\' 7"  (1.702 m), weight 172 lb (78 kg), SpO2 99 %.  Review of Systems  Constitutional: Positive for fatigue. Negative for activity change, appetite change, chills, diaphoresis, fever and unexpected weight change.  Eyes: Negative for visual disturbance.  Respiratory: Negative for cough, chest tightness, shortness of breath, wheezing and stridor.   Cardiovascular: Negative for chest pain, palpitations and leg swelling.  Gastrointestinal: Negative for abdominal distention, anal bleeding, blood in stool, constipation, diarrhea, nausea and vomiting.  Genitourinary: Negative for difficulty urinating and flank pain.  Musculoskeletal: Negative for arthralgias, back pain, gait problem, joint swelling, myalgias, neck pain and neck stiffness.  Skin: Negative for color change, pallor, rash and wound.  Neurological: Negative for dizziness and headaches.  Hematological: Does not bruise/bleed easily.  Psychiatric/Behavioral: Negative for behavioral problems, confusion, decreased concentration, dysphoric mood, hallucinations, self-injury, sleep disturbance and suicidal ideas. The patient is not nervous/anxious and is not hyperactive.        Objective:   Physical Exam Vitals signs and nursing note reviewed.  Constitutional:      General: He is not in acute distress.    Appearance: He is well-developed. He is not diaphoretic.  HENT:     Head: Normocephalic and atraumatic.     Right Ear: External ear normal.     Left Ear: External ear normal.     Nose: Nose normal.  Eyes:     Conjunctiva/sclera: Conjunctivae normal.     Pupils: Pupils are equal, round, and reactive to light.  Cardiovascular:     Rate and Rhythm: Normal rate and regular rhythm.     Heart sounds: Normal heart sounds. No murmur.  Pulmonary:     Effort: Pulmonary effort is normal. No respiratory distress.     Breath sounds: Normal breath sounds. No stridor. No wheezing or rales.  Chest:     Chest wall: No  tenderness.  Skin:    General: Skin is warm and dry.     Capillary Refill: Capillary refill takes less than 2 seconds.     Findings: No erythema or rash.  Neurological:     Mental Status: He is alert and oriented to person, place, and time.  Psychiatric:        Behavior: Behavior normal.        Thought Content: Thought content normal.        Judgment: Judgment normal.       Assessment & Plan:   1. Healthcare maintenance   2. Essential hypertension   3. Type 2 diabetes mellitus without complication, with long-term current use of insulin ([place])     Healthcare maintenance Your blood pressure and weight are excellent. Continue all medications as directed. To help with medication compliance, suggest: 1) place sticky notes on bathroom mirrors 2) set reminder on phone 3) have family members ask if you have taken your medications Continue to consistently follow Diabetic diet. Increase plain  water intake, strive for at least 80 oz/day. GREAT JOB on quitting smoking!!! Recommend using Nicoderm patches to help it stick.  Essential hypertension BP at goal 123/70, HR 62 Continue Lisinopril 5mg  QD, Atenolol/Chlothalidone 50/25mg  QD Remain off tobacco  Diabetes ([facility]) Lab Results  Component Value Date   HGBA1C 10.0 (A) 09/23/2018   HGBA1C 10.5 (H) 07/01/2018   HGBA1C 7.7 (H) 02/26/2018  AM BS 200-270s He reports forgetting lantus and metforming doses 1-2 times week To help with medication compliance, suggest: 1) place sticky notes on bathroom mirrors 2) set reminder on phone 3) have family members ask if you have taken your medications Has f/u with Endo 12/02/2018    FOLLOW-UP:  Return in about 4 months (around 03/28/2019) for CPE.

## 2018-11-21 ENCOUNTER — Telehealth: Payer: Self-pay | Admitting: Adult Health

## 2018-11-21 NOTE — Telephone Encounter (Signed)
After reviewing chart, advised pt that Dr. Sherren Mocha had given him a 90 day supply of these medications with enough refills to last until 06/2019 and that he needed to call the pharmacy for refills.  Pt expressed understanding and is agreeable.  Charyl Bigger, CMA

## 2018-11-21 NOTE — Telephone Encounter (Signed)
Patient is requesting a refill of his atenolol and atorvastatin, if approved please send refills to [facility].

## 2018-11-27 ENCOUNTER — Encounter: Payer: Self-pay | Admitting: Adult Health

## 2018-11-27 ENCOUNTER — Ambulatory Visit (INDEPENDENT_AMBULATORY_CARE_PROVIDER_SITE_OTHER): Payer: BLUE CROSS/BLUE SHIELD | Admitting: Adult Health

## 2018-11-27 VITALS — BP 123/70 | HR 62 | Temp 98.3°F | Ht 67.0 in | Wt 172.0 lb

## 2018-11-27 DIAGNOSIS — Z23 Encounter for immunization: Secondary | ICD-10-CM

## 2018-11-27 DIAGNOSIS — I10 Essential (primary) hypertension: Secondary | ICD-10-CM

## 2018-11-27 DIAGNOSIS — Z794 Long term (current) use of insulin: Secondary | ICD-10-CM

## 2018-11-27 DIAGNOSIS — E119 Type 2 diabetes mellitus without complications: Secondary | ICD-10-CM

## 2018-11-27 DIAGNOSIS — Z0000 Encounter for general adult medical examination without abnormal findings: Secondary | ICD-10-CM

## 2018-11-27 NOTE — Assessment & Plan Note (Signed)
BP at goal 123/70, HR 62 Continue Lisinopril 5mg  QD, Atenolol/Chlothalidone 50/25mg  QD Remain off tobacco

## 2018-11-27 NOTE — Addendum Note (Signed)
Addended by: Fonnie Mu on: 11/27/2018 09:08 AM   Modules accepted: Orders

## 2018-11-27 NOTE — Assessment & Plan Note (Signed)
Your blood pressure and weight are excellent. Continue all medications as directed. To help with medication compliance, suggest: 1) place sticky notes on bathroom mirrors 2) set reminder on phone 3) have family members ask if you have taken your medications Continue to consistently follow Diabetic diet. Increase plain water intake, strive for at least 80 oz/day. GREAT JOB on quitting smoking!!! Recommend using Nicoderm patches to help it stick.

## 2018-11-27 NOTE — Patient Instructions (Signed)
Diabetes Mellitus and Nutrition, Adult When you have diabetes (diabetes mellitus), it is very important to have healthy eating habits because your blood sugar (glucose) levels are greatly affected by what you eat and drink. Eating healthy foods in the appropriate amounts, at about the same times every day, can help you:  Control your blood glucose.  Lower your risk of heart disease.  Improve your blood pressure.  Reach or maintain a healthy weight. Every person with diabetes is different, and each person has different needs for a meal plan. Your health care provider may recommend that you work with a diet and nutrition specialist (dietitian) to make a meal plan that is best for you. Your meal plan may vary depending on factors such as:  The calories you need.  The medicines you take.  Your weight.  Your blood glucose, blood pressure, and cholesterol levels.  Your activity level.  Other health conditions you have, such as heart or kidney disease. How do carbohydrates affect me? Carbohydrates, also called carbs, affect your blood glucose level more than any other type of food. Eating carbs naturally raises the amount of glucose in your blood. Carb counting is a method for keeping track of how many carbs you eat. Counting carbs is important to keep your blood glucose at a healthy level, especially if you use insulin or take certain oral diabetes medicines. It is important to know how many carbs you can safely have in each meal. This is different for every person. Your dietitian can help you calculate how many carbs you should have at each meal and for each snack. Foods that contain carbs include:  Bread, cereal, rice, pasta, and crackers.  Potatoes and corn.  Peas, beans, and lentils.  Milk and yogurt.  Fruit and juice.  Desserts, such as cakes, cookies, ice cream, and candy. How does alcohol affect me? Alcohol can cause a sudden decrease in blood glucose (hypoglycemia),  especially if you use insulin or take certain oral diabetes medicines. Hypoglycemia can be a life-threatening condition. Symptoms of hypoglycemia (sleepiness, dizziness, and confusion) are similar to symptoms of having too much alcohol. If your health care provider says that alcohol is safe for you, follow these guidelines:  Limit alcohol intake to no more than 1 drink per day for nonpregnant women and 2 drinks per day for men. One drink equals 12 oz of beer, 5 oz of wine, or 1 oz of hard liquor.  Do not drink on an empty stomach.  Keep yourself hydrated with water, diet soda, or unsweetened iced tea.  Keep in mind that regular soda, juice, and other mixers may contain a lot of sugar and must be counted as carbs. What are tips for following this plan?  Reading food labels  Start by checking the serving size on the "Nutrition Facts" label of packaged foods and drinks. The amount of calories, carbs, fats, and other nutrients listed on the label is based on one serving of the item. Many items contain more than one serving per package.  Check the total grams (g) of carbs in one serving. You can calculate the number of servings of carbs in one serving by dividing the total carbs by 15. For example, if a food has 30 g of total carbs, it would be equal to 2 servings of carbs.  Check the number of grams (g) of saturated and trans fats in one serving. Choose foods that have low or no amount of these fats.  Check the number of   milligrams (mg) of salt (sodium) in one serving. Most people should limit total sodium intake to less than 2,300 mg per day.  Always check the nutrition information of foods labeled as "low-fat" or "nonfat". These foods may be higher in added sugar or refined carbs and should be avoided.  Talk to your dietitian to identify your daily goals for nutrients listed on the label. Shopping  Avoid buying canned, premade, or processed foods. These foods tend to be high in fat, sodium,  and added sugar.  Shop around the outside edge of the grocery store. This includes fresh fruits and vegetables, bulk grains, fresh meats, and fresh dairy. Cooking  Use low-heat cooking methods, such as baking, instead of high-heat cooking methods like deep frying.  Cook using healthy oils, such as olive, canola, or sunflower oil.  Avoid cooking with butter, cream, or high-fat meats. Meal planning  Eat meals and snacks regularly, preferably at the same times every day. Avoid going long periods of time without eating.  Eat foods high in fiber, such as fresh fruits, vegetables, beans, and whole grains. Talk to your dietitian about how many servings of carbs you can eat at each meal.  Eat 4-6 ounces (oz) of lean protein each day, such as lean meat, chicken, fish, eggs, or tofu. One oz of lean protein is equal to: ? 1 oz of meat, chicken, or fish. ? 1 egg. ?  cup of tofu.  Eat some foods each day that contain healthy fats, such as avocado, nuts, seeds, and fish. Lifestyle  Check your blood glucose regularly.  Exercise regularly as told by your health care provider. This may include: ? 150 minutes of moderate-intensity or vigorous-intensity exercise each week. This could be brisk walking, biking, or water aerobics. ? Stretching and doing strength exercises, such as yoga or weightlifting, at least 2 times a week.  Take medicines as told by your health care provider.  Do not use any products that contain nicotine or tobacco, such as cigarettes and e-cigarettes. If you need help quitting, ask your health care provider.  Work with a Social worker or diabetes educator to identify strategies to manage stress and any emotional and social challenges. Questions to ask a health care provider  Do I need to meet with a diabetes educator?  Do I need to meet with a dietitian?  What number can I call if I have questions?  When are the best times to check my blood glucose? Where to find more  information:  American Diabetes Association: diabetes.org  Academy of Nutrition and Dietetics: www.eatright.[employer] of Diabetes and Digestive and Kidney Diseases (NIH): [employer] Summary  A healthy meal plan will help you control your blood glucose and maintain a healthy lifestyle.  Working with a diet and nutrition specialist (dietitian) can help you make a meal plan that is best for you.  Keep in mind that carbohydrates (carbs) and alcohol have immediate effects on your blood glucose levels. It is important to count carbs and to use alcohol carefully. This information is not intended to replace advice given to you by your health care provider. Make sure you discuss any questions you have with your health care provider. Document Released: 06/29/2005 Document Revised: 05/02/2017 Document Reviewed: 11/06/2016 Elsevier Interactive Patient Education  2019 [place] blood pressure and weight are excellent. Continue all medications as directed. To help with medication compliance, suggest: 1) place sticky notes on bathroom mirrors 2) set reminder on phone 3) have family  members ask if you have taken your medications Continue to consistently follow Diabetic diet. Increase plain water intake, strive for at least 80 oz/day. GREAT JOB on quitting smoking!!! Recommend using Nicoderm patches to help it stick. Recommend complete physical in 4 months.

## 2018-11-27 NOTE — Assessment & Plan Note (Signed)
Lab Results  Component Value Date   HGBA1C 10.0 (A) 09/23/2018   HGBA1C 10.5 (H) 07/01/2018   HGBA1C 7.7 (H) 02/26/2018  AM BS 200-270s He reports forgetting lantus and metforming doses 1-2 times week To help with medication compliance, suggest: 1) place sticky notes on bathroom mirrors 2) set reminder on phone 3) have family members ask if you have taken your medications Has f/u with Endo 12/02/2018

## 2018-11-28 ENCOUNTER — Encounter: Payer: Self-pay | Admitting: Adult Health

## 2018-12-02 ENCOUNTER — Ambulatory Visit: Payer: BLUE CROSS/BLUE SHIELD | Admitting: Endocrinology

## 2018-12-05 ENCOUNTER — Ambulatory Visit (INDEPENDENT_AMBULATORY_CARE_PROVIDER_SITE_OTHER): Payer: BLUE CROSS/BLUE SHIELD | Admitting: Endocrinology

## 2018-12-05 ENCOUNTER — Other Ambulatory Visit: Payer: Self-pay

## 2018-12-05 ENCOUNTER — Encounter: Payer: Self-pay | Admitting: Endocrinology

## 2018-12-05 VITALS — BP 126/68 | HR 70 | Ht 67.0 in | Wt 173.4 lb

## 2018-12-05 DIAGNOSIS — E119 Type 2 diabetes mellitus without complications: Secondary | ICD-10-CM | POA: Diagnosis not present

## 2018-12-05 DIAGNOSIS — Z794 Long term (current) use of insulin: Secondary | ICD-10-CM

## 2018-12-05 LAB — POCT GLYCOSYLATED HEMOGLOBIN (HGB A1C): HEMOGLOBIN A1C: 10.5 % — AB (ref 4.0–5.6)

## 2018-12-05 MED ORDER — INSULIN GLARGINE 100 UNIT/ML SOLOSTAR PEN: 30.0000 [IU] | PEN_INJECTOR | SUBCUTANEOUS | 99 refills | Status: DC

## 2018-12-05 NOTE — Progress Notes (Signed)
Subjective:    Patient ID: Tyler Wolf, male    DOB: 10-03-1956, 63 y.o.   MRN: [id number]  HPI Pt returns for f/u of diabetes mellitus: DM type: Insulin-requiring type 2 Dx'ed: 4097 Complications: none Therapy: insulin since 2017 DKA: never Severe hypoglycemia: never Pancreatitis: never Pancreatic imaging: normal on 2007 CT Other: due to missing insulin, he is not a candidate for multiple daily injections Interval history: Pt says he occasionally misses the insulin.  Meter is downloaded today, and the printout is scanned into the record. All are checked at [facility] according to the printout, but he says these are fasting.  All are in the 200's.  pt states he feels well in general.  Past Medical History:  Diagnosis Date  . DIABETES MELLITUS, TYPE II 05/17/2007  . DIVERTICULITIS, ACUTE 08/17/2010  . 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 HYPERLIPIDEMIA, MIXED 05/27/2007  . HYPERTENSION 05/17/2007  . TOBACCO ABUSE 05/27/2007    No past surgical history on file.  Social History   Socioeconomic History  . Marital status: Married    Spouse name: Not on file  . Number of children: Not on file  . Years of education: Not on file  . Highest education level: Not on file  Occupational History  . Not on file  Social Needs  . Financial resource strain: Not on file  . Food insecurity:    Worry: Not on file    Inability: Not on file  . Transportation needs:    Medical: Not on file    Non-medical: Not on file  Tobacco Use  . Smoking status: Former Smoker    Packs/day: 0.25    Years: 40.00    Pack years: 10.00    Last attempt to quit: 11/06/2018    Years since quitting: 0.0  . Smokeless tobacco: Never Used  . Tobacco comment: electronic cigarettes  Substance and Sexual Activity  . Alcohol use: No    Alcohol/week: 0.0 standard drinks  . Drug use: No  . Sexual activity: Not Currently    Birth control/protection: None  Lifestyle  . Physical activity:    Days per week: Not on  file    Minutes per session: Not on file  . Stress: Not on file  Relationships  . Social connections:    Talks on phone: Not on file    Gets together: Not on file    Attends religious service: Not on file    Active member of club or organization: Not on file    Attends meetings of clubs or organizations: Not on file    Relationship status: Not on file  . Intimate partner violence:    Fear of current or ex partner: Not on file    Emotionally abused: Not on file    Physically abused: Not on file    Forced sexual activity: Not on file  Other Topics Concern  . Not on file  Social History Narrative  . Not on file    Current Outpatient Medications on File Prior to Visit  Medication Sig Dispense Refill  . aspirin 81 MG tablet Take 81 mg by mouth daily.      Marland Kitchen atenolol-chlorthalidone (TENORETIC) 50-25 MG tablet Take 1 tablet by mouth daily. 90 tablet 4  . atorvastatin (LIPITOR) 80 MG tablet TAKE 1 TABLET (80 MG TOTAL) BY MOUTH NIGHTLY 100 tablet 4  . Insulin Syringe 27G X 1/2" 0.5 ML MISC 1 Syringe by Does not apply route daily. 100 each 1  .  lisinopril (PRINIVIL,ZESTRIL) 5 MG tablet TAKE 1 TABLET (5 MG TOTAL) BY MOUTH DAILY. 100 tablet 3  . metFORMIN (GLUCOPHAGE) 1000 MG tablet Take 1 tablet (1,000 mg total) by mouth 2 (two) times daily. 200 tablet 4   No current facility-administered medications on file prior to visit.     Allergies  Allergen Reactions  . Latex     REACTION: Rash    Family History  Problem Relation Age of Onset  . Diabetes Mother   . Heart attack Father   . Diabetes Father     BP 126/68 (BP Location: Left Arm, Patient Position: Sitting, Cuff Size: Normal)   Pulse 70   Ht 5\' 7"  (1.702 m)   Wt 173 lb 6.4 oz (78.7 kg)   SpO2 97%   BMI 27.16 kg/m    Review of Systems He denies hypoglycemia.      Objective:   Physical Exam VITAL SIGNS:  See vs page GENERAL: no distress Pulses: dorsalis pedis intact bilat.   MSK: no deformity of the feet CV: no  leg edema Skin:  no ulcer on the feet, but there are heavy calluses.  normal color and temp on the feet. Neuro: sensation is intact to touch on the feet Ext: There is bilateral onychomycosis of the toenails.  Both great toenail are ingrown.   Lab Results  Component Value Date   HGBA1C 10.5 (A) 12/05/2018       Assessment & Plan:  Insulin-requiring type 2 DM: worse. Noncompliance with insulin: we discussed need to take insulin as rx'ed.    Patient Instructions  check your blood sugar twice a day.  vary the time of day when you check, between before the 3 meals, and at bedtime.  also check if you have symptoms of your blood sugar being too high or too low.  please keep a record of the readings and bring it to your next appointment here (or you can bring the meter itself).  You can write it on any piece of paper.  please call us sooner if your blood sugar goes below 70, or if you have a lot of readings over 200. I have sent a prescription to your pharmacy, to increase the lantus to 30 units each morning.  Please continue the same metformin.  Please come back for a follow-up appointment in 2 months.

## 2018-12-05 NOTE — Patient Instructions (Addendum)
check your blood sugar twice a day.  vary the time of day when you check, between before the 3 meals, and at bedtime.  also check if you have symptoms of your blood sugar being too high or too low.  please keep a record of the readings and bring it to your next appointment here (or you can bring the meter itself).  You can write it on any piece of paper.  please call us sooner if your blood sugar goes below 70, or if you have a lot of readings over 200. I have sent a prescription to your pharmacy, to increase the lantus to 30 units each morning.  Please continue the same metformin.  Please come back for a follow-up appointment in 2 months.

## 2019-02-04 ENCOUNTER — Ambulatory Visit (INDEPENDENT_AMBULATORY_CARE_PROVIDER_SITE_OTHER): Payer: BLUE CROSS/BLUE SHIELD | Admitting: Endocrinology

## 2019-02-04 ENCOUNTER — Other Ambulatory Visit: Payer: Self-pay

## 2019-02-04 ENCOUNTER — Encounter: Payer: Self-pay | Admitting: Endocrinology

## 2019-02-04 VITALS — BP 124/78 | HR 63 | Ht 67.0 in | Wt 173.0 lb

## 2019-02-04 DIAGNOSIS — Z794 Long term (current) use of insulin: Secondary | ICD-10-CM | POA: Diagnosis not present

## 2019-02-04 DIAGNOSIS — E119 Type 2 diabetes mellitus without complications: Secondary | ICD-10-CM

## 2019-02-04 LAB — POCT GLYCOSYLATED HEMOGLOBIN (HGB A1C): Hemoglobin A1C: 10.7 % — AB (ref 4.0–5.6)

## 2019-02-04 MED ORDER — GLUCOSE BLOOD VI STRP: 1.0000 | ORAL_STRIP | Freq: Two times a day (BID) | 3 refills | Status: DC

## 2019-02-04 MED ORDER — INSULIN GLARGINE 100 UNIT/ML SOLOSTAR PEN: 40.0000 [IU] | PEN_INJECTOR | SUBCUTANEOUS | 99 refills | Status: DC

## 2019-02-04 NOTE — Patient Instructions (Addendum)
check your blood sugar twice a day.  vary the time of day when you check, between before the 3 meals, and at bedtime.  also check if you have symptoms of your blood sugar being too high or too low.  please keep a record of the readings and bring it to your next appointment here (or you can bring the meter itself).  You can write it on any piece of paper.  please call us sooner if your blood sugar goes below 70, or if you have a lot of readings over 200. I have sent a prescription to your pharmacy, to increase the lantus to 40 units each morning.  Please continue the same metformin.  Please come back for a follow-up appointment in 2 months.

## 2019-02-04 NOTE — Progress Notes (Signed)
Subjective:    Patient ID: Tyler Wolf, male    DOB: 1955-10-19, 63 y.o.   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   Interval history: Pt says he never misses the insulin. He lost cbg meter.  pt states he feels well in general.  Past Medical History:  Diagnosis Date  . DIABETES MELLITUS, TYPE II 05/17/2007  . DIVERTICULITIS, ACUTE 08/17/2010  . 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 HYPERLIPIDEMIA, MIXED 05/27/2007  . HYPERTENSION 05/17/2007  . TOBACCO ABUSE 05/27/2007    No past surgical history on file.  Social History   Socioeconomic History  . Marital status: Married    Spouse name: Not on file  . Number of children: Not on file  . Years of education: Not on file  . Highest education level: Not on file  Occupational History  . Not on file  Social Needs  . Financial resource strain: Not on file  . Food insecurity:    Worry: Not on file    Inability: Not on file  . Transportation needs:    Medical: Not on file    Non-medical: Not on file  Tobacco Use  . Smoking status: Former Smoker    Packs/day: 0.25    Years: 40.00    Pack years: 10.00    Last attempt to quit: 11/06/2018    Years since quitting: 0.2  . Smokeless tobacco: Never Used  . Tobacco comment: electronic cigarettes  Substance and Sexual Activity  . Alcohol use: No    Alcohol/week: 0.0 standard drinks  . Drug use: No  . Sexual activity: Not Currently    Birth control/protection: None  Lifestyle  . Physical activity:    Days per week: Not on file    Minutes per session: Not on file  . Stress: Not on file  Relationships  . Social connections:     Talks on phone: Not on file    Gets together: Not on file    Attends religious service: Not on file    Active member of club or organization: Not on file    Attends meetings of clubs or organizations: Not on file    Relationship status: Not on file  . Intimate partner violence:    Fear of current or ex partner: Not on file    Emotionally abused: Not on file    Physically abused: Not on file    Forced sexual activity: Not on file  Other Topics Concern  . Not on file  Social History Narrative  . Not on file    Current Outpatient Medications on File Prior to Visit  Medication Sig Dispense Refill  . aspirin 81 MG tablet Take 81 mg by mouth daily.      Marland Kitchen atenolol-chlorthalidone (TENORETIC) 50-25 MG tablet Take 1 tablet by mouth daily. 90 tablet 4  . atorvastatin (LIPITOR) 80 MG tablet TAKE 1 TABLET (80 MG TOTAL) BY MOUTH NIGHTLY 100 tablet 4  . Insulin Syringe 27G X 1/2" 0.5 ML MISC 1 Syringe by Does not apply route daily. 100 each 1  . lisinopril (PRINIVIL,ZESTRIL) 5 MG tablet TAKE 1 TABLET (5 MG TOTAL) BY MOUTH DAILY. 100 tablet 3  .  metFORMIN (GLUCOPHAGE) 1000 MG tablet Take 1 tablet (1,000 mg total) by mouth 2 (two) times daily. 200 tablet 4   No current facility-administered medications on file prior to visit.     Allergies  Allergen Reactions  . Latex     REACTION: Rash    Family History  Problem Relation Age of Onset  . Diabetes Mother   . Heart attack Father   . Diabetes Father     BP 124/78 (BP Location: Left Arm, Patient Position: Sitting, Cuff Size: Normal)   Pulse 63   Ht 5\' 7"  (1.702 m)   Wt 173 lb (78.5 kg)   SpO2 95%   BMI 27.10 kg/m    Review of Systems He denies hypoglycemia.     Objective:   Physical Exam VITAL SIGNS:  See vs page GENERAL: no distress Pulses: dorsalis pedis intact bilat.   MSK: no deformity of the feet CV: no leg edema Skin:  no ulcer on the feet.  normal color and temp on the feet. Neuro: sensation is intact to touch on  the feet Ext: multiple toenails are ingrown. There is bilateral onychomycosis of the toenails   Lab Results  Component Value Date   HGBA1C 10.7 (A) 02/04/2019       Assessment & Plan:  Insulin-requiring type 2 DM: worse.  Lean body habitus: he may be evolving type 1.  Noncompliance with cbg recording: in this setting, we'll have to increase insulin slowly.  Patient Instructions  check your blood sugar twice a day.  vary the time of day when you check, between before the 3 meals, and at bedtime.  also check if you have symptoms of your blood sugar being too high or too low.  please keep a record of the readings and bring it to your next appointment here (or you can bring the meter itself).  You can write it on any piece of paper.  please call us sooner if your blood sugar goes below 70, or if you have a lot of readings over 200. I have sent a prescription to your pharmacy, to increase the lantus to 40 units each morning.  Please continue the same metformin.  Please come back for a follow-up appointment in 2 months.

## 2019-03-13 NOTE — Progress Notes (Addendum)
Subjective:    Patient ID: Tyler Wolf, male    DOB: 1955-11-17, 63 y.o.   MRN: [id number]  HPI:  11/27/2018 OV: [name] is here for 3 month f/u: T2D, HLD, HTN He stopped tobacco use 3 weeks ago- GRE[employer]OB! He reports AM BS 200-270 He reports forgetting to take metformin and lantus as least 1-2 times per week due to fluctuating work schedule He reports reducing sugar in diet and rarely eats CHO at evening meal- mostly vegetables and protein  He has upcoming OV with Endo/Dr. Garnette Scheuermann 12/02/2018, hopefully A1c will be much improved, last 09/23/2018- 10.0 He denies CP/dyspnea/dizziness/HA/palpitations He estimates to drink 30-40 oz plain water, prefers to hydrate with unsweetened tea  03/17/2019 OV: Mr. [name] presents for CPE He reports medication compliance, denies SE He reports AM BG 175-265, poorly controlled due to his self reported "sweet tooth" He denies episodes of hypoglycemia Followed by Dr. Otilio Saber,. Next OV 03/25/2019 Lab Results  Component Value Date   HGBA1C 10.7 (A) 02/04/2019   HGBA1C 10.5 (A) 12/05/2018   HGBA1C 10.0 (A) 09/23/2018  He estimates to drink >75 oz water day and is quite acyive with his job in [employer]. He resumed tobacco use- 1/2 pack/day- requests Nicoderm. He reports "smokers cough", denies fever/myaglia's/N/V/D He denies recent travel or known exposure to [place] Maintenance: Colonoscopy-UTD, last 09/26/2010 AAA Screening- N/A due to age LDCT- pt declined Immunizations- UTD  Patient Care Team    Relationship Specialty Notifications Start End  Esaw Grandchild, NP PCP - General Family Medicine  08/27/18     Patient Active Problem List   Diagnosis Date Noted  . Healthcare maintenance 08/27/2018  . Popliteal cyst, left 07/03/2017  . Diabetes ([place]) 06/04/2014  . HYPERLIPIDEMIA, MIXED 05/27/2007  . 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 Essential hypertension 05/17/2007     Past Medical History:  Diagnosis Date   . DIABETES MELLITUS, TYPE II 05/17/2007  . DIVERTICULITIS, ACUTE 08/17/2010  . 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 HYPERLIPIDEMIA, MIXED 05/27/2007  . HYPERTENSION 05/17/2007  . TOBACCO ABUSE 05/27/2007     History reviewed. No pertinent surgical history.   Family History  Problem Relation Age of Onset  . Diabetes Mother   . Heart attack Father   . Diabetes Father      Social History   Substance and Sexual Activity  Drug Use No     Social History   Substance and Sexual Activity  Alcohol Use No  . Alcohol/week: 0.0 standard drinks     Social History   Tobacco Use  Smoking Status Former Smoker  . Packs/day: 0.25  . Years: 40.00  . Pack years: 10.00  . Last attempt to quit: 11/06/2018  . Years since quitting: 0.3  Smokeless Tobacco Never Used  Tobacco Comment   electronic cigarettes, 6/1/2020pt states on and off     Outpatient Encounter Medications as of 03/17/2019  Medication Sig  . aspirin 81 MG tablet Take 81 mg by mouth daily.    Marland Kitchen atenolol-chlorthalidone (TENORETIC) 50-25 MG tablet Take 1 tablet by mouth daily.  Marland Kitchen atorvastatin (LIPITOR) 80 MG tablet TAKE 1 TABLET (80 MG TOTAL) BY MOUTH NIGHTLY  . glucose blood (CONTOUR NEXT TEST) test strip 1 each by Other route 2 (two) times daily. And lancets 2/day  . Insulin Glargine (LANTUS SOLOSTAR) 100 UNIT/ML Solostar Pen Inject 40 Units into the skin every morning. And pen needles 1/day  . Insulin Syringe 27G X 1/2" 0.5 ML MISC 1 Syringe  by Does not apply route daily.  Marland Kitchen lisinopril (PRINIVIL,ZESTRIL) 5 MG tablet TAKE 1 TABLET (5 MG TOTAL) BY MOUTH DAILY.  . metFORMIN (GLUCOPHAGE) 1000 MG tablet Take 1 tablet (1,000 mg total) by mouth 2 (two) times daily.  . nicotine (NICODERM CQ) 7 mg/24hr patch Place 1 patch (7 mg total) onto the skin daily.   No facility-administered encounter medications on file as of 03/17/2019.     Allergies: Latex  Body mass index is 27.33 kg/m.  Blood pressure 121/73, pulse 71,  temperature 98.2 F (36.8 C), temperature source Oral, height 5\' 7"  (1.702 m), weight 174 lb 8 oz (79.2 kg), SpO2 99 %.  Review of Systems  Constitutional: Positive for fatigue. Negative for activity change, appetite change, chills, diaphoresis, fever and unexpected weight change.  HENT: Negative for congestion, dental problem, postnasal drip, rhinorrhea, sneezing and voice change.   Eyes: Negative for visual disturbance.  Respiratory: Positive for cough. Negative for chest tightness, shortness of breath, wheezing and stridor.   Cardiovascular: Negative for chest pain, palpitations and leg swelling.  Gastrointestinal: Negative for abdominal distention, abdominal pain, anal bleeding, blood in stool, constipation, diarrhea, nausea, rectal pain and vomiting.  Endocrine: Negative for cold intolerance, heat intolerance, polydipsia, polyphagia and polyuria.  Genitourinary: Negative for difficulty urinating and flank pain.  Musculoskeletal: Negative for arthralgias, back pain, gait problem, joint swelling, myalgias, neck pain and neck stiffness.  Skin: Negative for color change, pallor, rash and wound.  Neurological: Negative for dizziness and headaches.  Hematological: Does not bruise/bleed easily.  Psychiatric/Behavioral: Negative for agitation, behavioral problems, confusion, decreased concentration, dysphoric mood, hallucinations, self-injury, sleep disturbance and suicidal ideas. The patient is not nervous/anxious and is not hyperactive.        Objective:   Physical Exam Constitutional:      General: He is not in acute distress.    Appearance: Normal appearance. He is not ill-appearing, toxic-appearing or diaphoretic.  HENT:     Head: Normocephalic and atraumatic.     Comments: Bil hearing aids    Right Ear: Tympanic membrane, ear canal and external ear normal.     Left Ear: Tympanic membrane, ear canal and external ear normal.     Nose: Nose normal.     Mouth/Throat:     Mouth: Mucous  membranes are moist.     Pharynx: No oropharyngeal exudate or posterior oropharyngeal erythema.  Eyes:     Extraocular Movements: Extraocular movements intact.     Conjunctiva/sclera: Conjunctivae normal.     Pupils: Pupils are equal, round, and reactive to light.  Neck:     Musculoskeletal: Normal range of motion.  Cardiovascular:     Rate and Rhythm: Normal rate.     Pulses: Normal pulses.     Heart sounds: Normal heart sounds. No murmur. No friction rub. No gallop.   Pulmonary:     Effort: Pulmonary effort is normal. No respiratory distress.     Breath sounds: No wheezing or rales.  Abdominal:     General: Abdomen is flat. Bowel sounds are normal. There is no distension.     Palpations: Abdomen is soft. There is no mass.     Tenderness: There is no abdominal tenderness. There is no right CVA tenderness, left CVA tenderness, guarding or rebound.     Hernia: No hernia is present.  Genitourinary:    Comments: Declined DRE/genital examination  Musculoskeletal: Normal range of motion.  Lymphadenopathy:     Cervical: No cervical adenopathy.  Skin:  General: Skin is warm and dry.     Capillary Refill: Capillary refill takes less than 2 seconds.  Neurological:     Mental Status: He is alert and oriented to person, place, and time.  Psychiatric:        Mood and Affect: Mood normal.        Behavior: Behavior normal.        Thought Content: Thought content normal.        Judgment: Judgment normal.       Assessment & Plan:   1. HYPERLIPIDEMIA, MIXED   2. Healthcare maintenance   3. Type 2 diabetes mellitus without complication, with long-term current use of insulin ([place])   4. Essential hypertension     HYPERLIPIDEMIA, MIXED 07/01/2018: Cholesterol 0 - 200 mg/dL 122   Comment: ATP III Classification    Desirable: < 200 mg/dL        Borderline High: 200 - 239 mg/dL     High: > = 240 mg/dL  Triglycerides 0.0 - 149.0 mg/dL 152.0High    Comment: Normal: <150  mg/dLBorderline High: 150 - 199 mg/dL  HDL >39.00 mg/dL 28.30Low    VLDL 0.0 - 40.0 mg/dL 30.4   LDL Cholesterol 0 - 99 mg/dL 63   Total CHOL/HDL Ratio  4    Currently on atorvastatin 80mg  QD Needs Lipid Panel 3 months  Essential hypertension BP: 121/73, HR 71 Currently on Atenolol/Chlorthalidone 50/25mg  QD/, Lisinopril 5mg  QD  Diabetes ([facility]) Lab Results  Component Value Date   HGBA1C 10.7 (A) 02/04/2019   HGBA1C 10.5 (A) 12/05/2018   HGBA1C 10.0 (A) 09/23/2018  He reports AM BG 175-265, poorly controlled due to his self reported "sweet tooth" He denies episodes of hypoglycemia Followed by Dr. Otilio Saber,. Next OV 03/25/2019 Currently on Metformin 1000mg  BID, Lantus 40U QD  Healthcare maintenance  Remain well hydrated, follow Diabetic Diet. Recommend going to American Diabetic Association website to research glycemic index of foods and diabetic recipes. Please keep your appt with Dr. Loanne Drilling next week. Nicoderm patch 7mg  sent in, if you need refill and stringer dose- please contact clinic- you can do it! Continue all other medications as directed. Remain as active as possible. Follow-up in 3 months, come fasting so we can obtain cholesterol panel. Continue to Social Distance and wear a mask while in public.    FOLLOW-UP:  Return in about 3 months (around 06/17/2019) for Regular Follow Up, HTN, Hypercholestermia, Diabetes, Fasting Labs.

## 2019-03-17 ENCOUNTER — Other Ambulatory Visit: Payer: Self-pay

## 2019-03-17 ENCOUNTER — Encounter: Payer: Self-pay | Admitting: Adult Health

## 2019-03-17 ENCOUNTER — Ambulatory Visit (INDEPENDENT_AMBULATORY_CARE_PROVIDER_SITE_OTHER): Payer: BLUE CROSS/BLUE SHIELD | Admitting: Adult Health

## 2019-03-17 VITALS — BP 121/73 | HR 71 | Temp 98.2°F | Ht 67.0 in | Wt 174.5 lb

## 2019-03-17 DIAGNOSIS — I10 Essential (primary) hypertension: Secondary | ICD-10-CM | POA: Diagnosis not present

## 2019-03-17 DIAGNOSIS — E119 Type 2 diabetes mellitus without complications: Secondary | ICD-10-CM

## 2019-03-17 DIAGNOSIS — Z794 Long term (current) use of insulin: Secondary | ICD-10-CM

## 2019-03-17 DIAGNOSIS — Z0000 Encounter for general adult medical examination without abnormal findings: Secondary | ICD-10-CM

## 2019-03-17 DIAGNOSIS — E782 Mixed hyperlipidemia: Secondary | ICD-10-CM

## 2019-03-17 MED ORDER — NICOTINE 7 MG/24HR TD PT24: 7.0000 mg | MEDICATED_PATCH | Freq: Every day | TRANSDERMAL | 0 refills | Status: DC

## 2019-03-17 NOTE — Assessment & Plan Note (Addendum)
BP: 121/73, HR 71 Currently on Atenolol/Chlorthalidone 50/25mg  QD/, Lisinopril 5mg  QD

## 2019-03-17 NOTE — Assessment & Plan Note (Signed)
>>  ASSESSMENT AND PLAN FOR HYPERLIPIDEMIA, MIXED WRITTEN ON 03/17/2019  8:48 AM BY DANFORD, KATY D, NP  07/01/2018: Cholesterol 0 - 200 mg/dL 440   Comment: ATP III Classification    Desirable: < 200 mg/dL        Borderline High: 200 - 239 mg/dL     High: > = 102 mg/dL  Triglycerides 0.0 - 725.3 mg/dL 664.4IHKV    Comment: Normal: <150 mg/dLBorderline High: 150 - 199 mg/dL  HDL >42.59 mg/dL 28.30Low    VLDL 0.0 - 40.0 mg/dL 56.3   LDL Cholesterol 0 - 99 mg/dL 63   Total CHOL/HDL Ratio  4    Currently on atorvastatin 80mg  QD Needs Lipid Panel 3 months

## 2019-03-17 NOTE — Assessment & Plan Note (Signed)
07/01/2018: Cholesterol 0 - 200 mg/dL 122   Comment: ATP III Classification    Desirable: < 200 mg/dL        Borderline High: 200 - 239 mg/dL     High: > = 240 mg/dL  Triglycerides 0.0 - 149.0 mg/dL 152.0High    Comment: Normal: <150 mg/dLBorderline High: 150 - 199 mg/dL  HDL >39.00 mg/dL 28.30Low    VLDL 0.0 - 40.0 mg/dL 30.4   LDL Cholesterol 0 - 99 mg/dL 63   Total CHOL/HDL Ratio  4    Currently on atorvastatin 80mg  QD Needs Lipid Panel 3 months

## 2019-03-17 NOTE — Assessment & Plan Note (Signed)
  Remain well hydrated, follow Diabetic Diet. Recommend going to American Diabetic Association website to research glycemic index of foods and diabetic recipes. Please keep your appt with Dr. Loanne Drilling next week. Nicoderm patch 7mg  sent in, if you need refill and stringer dose- please contact clinic- you can do it! Continue all other medications as directed. Remain as active as possible. Follow-up in 3 months, come fasting so we can obtain cholesterol panel. Continue to Social Distance and wear a mask while in public.

## 2019-03-17 NOTE — Patient Instructions (Addendum)

## 2019-03-17 NOTE — Assessment & Plan Note (Addendum)
Lab Results  Component Value Date   HGBA1C 10.7 (A) 02/04/2019   HGBA1C 10.5 (A) 12/05/2018   HGBA1C 10.0 (A) 09/23/2018  He reports AM BG 175-265, poorly controlled due to his self reported "sweet tooth" He denies episodes of hypoglycemia Followed by Dr. Otilio Saber,. Next OV 03/25/2019 Currently on Metformin 1000mg  BID, Lantus 40U QD

## 2019-03-25 ENCOUNTER — Ambulatory Visit (INDEPENDENT_AMBULATORY_CARE_PROVIDER_SITE_OTHER): Payer: BLUE CROSS/BLUE SHIELD | Admitting: Endocrinology

## 2019-03-25 ENCOUNTER — Ambulatory Visit: Payer: BLUE CROSS/BLUE SHIELD | Admitting: Endocrinology

## 2019-03-25 ENCOUNTER — Other Ambulatory Visit: Payer: Self-pay

## 2019-03-25 DIAGNOSIS — E119 Type 2 diabetes mellitus without complications: Secondary | ICD-10-CM

## 2019-03-25 DIAGNOSIS — Z794 Long term (current) use of insulin: Secondary | ICD-10-CM | POA: Diagnosis not present

## 2019-03-25 NOTE — Progress Notes (Signed)
Subjective:    Patient ID: Tyler Wolf, male    DOB: [birth year], 63 y.o.   MRN: [id number]  HPI telehealth visit today via phone x 6 minutes Alternatives to telehealth are presented to this patient, and the patient agrees to the telehealth visit. Pt is advised of the cost of the visit, and agrees to this, also.   Patient is at home, and I am at the office.   Persons attending the telehealth visit: the patient and I. Pt returns for f/u of diabetes mellitus: DM type: Insulin-requiring type 2 (but lean body habitus suggests he is developing type 1) Dx'ed: 3235 Complications: none Therapy: insulin since 2017, and metformin.   DKA: never Severe hypoglycemia: never Pancreatitis: never Pancreatic imaging: normal on 2007 CT Other: due to missing insulin, he is not a candidate for multiple daily injections.   Interval history: Pt says he has not recently missed the insulin.  Pt says cbg varies from 100-268.  It is in general higher as the day goes on.  pt states he feels well in general.   Past Medical History:  Diagnosis Date  . DIABETES MELLITUS, TYPE II 05/17/2007  . DIVERTICULITIS, ACUTE 08/17/2010  . 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 HYPERLIPIDEMIA, MIXED 05/27/2007  . HYPERTENSION 05/17/2007  . TOBACCO ABUSE 05/27/2007    No past surgical history on file.  Social History   Socioeconomic History  . Marital status: Married    Spouse name: Not on file  . Number of children: Not on file  . Years of education: Not on file  . Highest education level: Not on file  Occupational History  . Not on file  Social Needs  . Financial resource strain: Not on file  . Food insecurity:    Worry: Not on file    Inability: Not on file  . Transportation needs:    Medical: Not on file    Non-medical: Not on file  Tobacco Use  . Smoking status: Former Smoker    Packs/day: 0.25    Years: 40.00    Pack years: 10.00    Last attempt to quit: 11/06/2018    Years since quitting: 0.3  .  Smokeless tobacco: Never Used  . Tobacco comment: electronic cigarettes, 6/1/2020pt states on and off  Substance and Sexual Activity  . Alcohol use: No    Alcohol/week: 0.0 standard drinks  . Drug use: No  . Sexual activity: Not Currently    Birth control/protection: None  Lifestyle  . Physical activity:    Days per week: Not on file    Minutes per session: Not on file  . Stress: Not on file  Relationships  . Social connections:    Talks on phone: Not on file    Gets together: Not on file    Attends religious service: Not on file    Active member of club or organization: Not on file    Attends meetings of clubs or organizations: Not on file    Relationship status: Not on file  . Intimate partner violence:    Fear of current or ex partner: Not on file    Emotionally abused: Not on file    Physically abused: Not on file    Forced sexual activity: Not on file  Other Topics Concern  . Not on file  Social History Narrative  . Not on file    Current Outpatient Medications on File Prior to Visit  Medication Sig Dispense Refill  . aspirin 81 MG  tablet Take 81 mg by mouth daily.      Marland Kitchen atenolol-chlorthalidone (TENORETIC) 50-25 MG tablet Take 1 tablet by mouth daily. 90 tablet 4  . atorvastatin (LIPITOR) 80 MG tablet TAKE 1 TABLET (80 MG TOTAL) BY MOUTH NIGHTLY 100 tablet 4  . glucose blood (CONTOUR NEXT TEST) test strip 1 each by Other route 2 (two) times daily. And lancets 2/day 200 each 3  . Insulin Glargine (LANTUS SOLOSTAR) 100 UNIT/ML Solostar Pen Inject 40 Units into the skin every morning. And pen needles 1/day 5 pen PRN  . Insulin Syringe 27G X 1/2" 0.5 ML MISC 1 Syringe by Does not apply route daily. 100 each 1  . lisinopril (PRINIVIL,ZESTRIL) 5 MG tablet TAKE 1 TABLET (5 MG TOTAL) BY MOUTH DAILY. 100 tablet 3  . metFORMIN (GLUCOPHAGE) 1000 MG tablet Take 1 tablet (1,000 mg total) by mouth 2 (two) times daily. 200 tablet 4  . nicotine (NICODERM CQ) 7 mg/24hr patch Place 1  patch (7 mg total) onto the skin daily. 28 patch 0   No current facility-administered medications on file prior to visit.     Allergies  Allergen Reactions  . Latex     REACTION: Rash    Family History  Problem Relation Age of Onset  . Diabetes Mother   . Heart attack Father   . Diabetes Father     Review of Systems He denies hypoglycemia.      Objective:   Physical Exam    Lab Results  Component Value Date   CREATININE 0.81 07/01/2018   BUN 22 07/01/2018   NA 139 07/01/2018   K 3.6 07/01/2018   CL 100 07/01/2018   CO2 30 07/01/2018      Assessment & Plan:  Insulin-requiring type 2 DM: uncertain glycemic control.  The pattern of his cbg's indicates he needs a faster-acting qd insulin.    Please come in to have the a1c checked.   If it is high, we'll change to NPH.

## 2019-03-26 ENCOUNTER — Other Ambulatory Visit: Payer: Self-pay

## 2019-03-26 ENCOUNTER — Other Ambulatory Visit: Payer: Self-pay | Admitting: Endocrinology

## 2019-03-26 ENCOUNTER — Ambulatory Visit (INDEPENDENT_AMBULATORY_CARE_PROVIDER_SITE_OTHER): Payer: BLUE CROSS/BLUE SHIELD

## 2019-03-26 DIAGNOSIS — E119 Type 2 diabetes mellitus without complications: Secondary | ICD-10-CM

## 2019-03-26 DIAGNOSIS — Z794 Long term (current) use of insulin: Secondary | ICD-10-CM | POA: Diagnosis not present

## 2019-03-26 LAB — POCT GLYCOSYLATED HEMOGLOBIN (HGB A1C): Hemoglobin A1C: 9.3 % — AB (ref 4.0–5.6)

## 2019-03-26 MED ORDER — INSULIN ISOPHANE HUMAN 100 UNIT/ML KWIKPEN: 45.0000 [IU] | PEN_INJECTOR | SUBCUTANEOUS | 11 refills | Status: DC

## 2019-03-27 ENCOUNTER — Telehealth: Payer: Self-pay | Admitting: Endocrinology

## 2019-03-27 ENCOUNTER — Telehealth: Payer: Self-pay

## 2019-03-27 NOTE — Telephone Encounter (Signed)
PA initiated today through Cover My Meds for Humulin N Kwikpen. Will await insurance response re: approval/denial.  Otho Perl Key: [id number] - Rx #: [id number] Need help? Call us at [phone] Status Additional Information Required Drug HumuLIN N KwikPen 100UNIT/ML pen-injectors Form Blue Building control surveyor Form (CB) Original Claim Info 77 NDC Not Covered

## 2019-03-28 ENCOUNTER — Telehealth: Payer: Self-pay | Admitting: Endocrinology

## 2019-03-28 NOTE — Telephone Encounter (Signed)
[employer] called regarding PA

## 2019-03-28 NOTE — Telephone Encounter (Signed)
please contact patient: Ins declined the NPH pen.  Options are to change to NPH insulin in a vial, 40 units qam, or Levemir in a pen.  If we change to the Levemir, we would need to guess at an amount, then adjust, because it is not a unit-for-unit change.  What do you think?

## 2019-03-28 NOTE — Telephone Encounter (Signed)
Received notification from [place] that [place] for Humulin Claiborne Rigg has been denied. Following reasons provided: Does not meet criteria/definition for Medical Necessity. Must provide medical reason for why they cannot use Novolin or Novolog. Documents have been labeled and placed on Dr. Cordelia Pen desk for him to review and address

## 2019-04-03 ENCOUNTER — Other Ambulatory Visit: Payer: Self-pay

## 2019-04-03 DIAGNOSIS — Z794 Long term (current) use of insulin: Secondary | ICD-10-CM

## 2019-04-03 DIAGNOSIS — E119 Type 2 diabetes mellitus without complications: Secondary | ICD-10-CM

## 2019-04-03 MED ORDER — INSULIN NPH (HUMAN) (ISOPHANE) 100 UNIT/ML ~~LOC~~ SUSP: 40.0000 [IU] | Freq: Every day | SUBCUTANEOUS | 1 refills | Status: DC

## 2019-04-03 MED ORDER — "INSULIN SYRINGE 30G X 5/16"" 1 ML MISC": 1.0000 | Freq: Every day | 0 refills | Status: AC

## 2019-04-03 NOTE — Telephone Encounter (Signed)
Called pt and made him aware. Verbalized acceptance and understanding. 

## 2019-04-03 NOTE — Telephone Encounter (Signed)
Called pt to discuss. Has agreed to change Rx from NPH pen to NPH vials.

## 2019-04-03 NOTE — Telephone Encounter (Signed)
Patient called to inform his insurance does not cover that medication either. Agreed to call insurance company directly to get a further explanation.

## 2019-04-03 NOTE — Telephone Encounter (Signed)
It is cheapest to buy at [employer], without insurance.

## 2019-04-22 ENCOUNTER — Other Ambulatory Visit: Payer: Self-pay

## 2019-04-24 ENCOUNTER — Other Ambulatory Visit: Payer: Self-pay

## 2019-04-24 ENCOUNTER — Ambulatory Visit (INDEPENDENT_AMBULATORY_CARE_PROVIDER_SITE_OTHER): Payer: BC Managed Care – PPO | Admitting: Endocrinology

## 2019-04-24 ENCOUNTER — Encounter: Payer: Self-pay | Admitting: Endocrinology

## 2019-04-24 VITALS — BP 164/82 | HR 69 | Ht 67.0 in | Wt 180.8 lb

## 2019-04-24 DIAGNOSIS — Z794 Long term (current) use of insulin: Secondary | ICD-10-CM | POA: Diagnosis not present

## 2019-04-24 DIAGNOSIS — E119 Type 2 diabetes mellitus without complications: Secondary | ICD-10-CM

## 2019-04-24 NOTE — Progress Notes (Signed)
Subjective:    Patient ID: Tyler Wolf, male    DOB: 1956-08-20, 63 y.o.   MRN: [id number]  HPI Pt returns for f/u of diabetes mellitus: DM type: Insulin-requiring type 2 (but lean body habitus suggests he is developing type 1) Dx'ed: 5176 Complications: none Therapy: insulin since 2017, and metformin.   DKA: never Severe hypoglycemia: never Pancreatitis: never Pancreatic imaging: normal on 2007 CT Other: due to missing insulin, he is not a candidate for multiple daily injections; he changed Lantus to NPH, due to pattern of cbg's.   Interval history: Pt says he has not recently missed the insulin.  Pt says fasting cbg varies from 90-166.  He does not check later in the day.  pt states he feels well in general.  He has just finished the Lantus.   Past Medical History:  Diagnosis Date  . DIABETES MELLITUS, TYPE II 05/17/2007  . DIVERTICULITIS, ACUTE 08/17/2010  . 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 HYPERLIPIDEMIA, MIXED 05/27/2007  . HYPERTENSION 05/17/2007  . TOBACCO ABUSE 05/27/2007    No past surgical history on file.  Social History   Socioeconomic History  . Marital status: Married    Spouse name: Not on file  . Number of children: Not on file  . Years of education: Not on file  . Highest education level: Not on file  Occupational History  . Not on file  Social Needs  . Financial resource strain: Not on file  . Food insecurity    Worry: Not on file    Inability: Not on file  . Transportation needs    Medical: Not on file    Non-medical: Not on file  Tobacco Use  . Smoking status: Former Smoker    Packs/day: 0.25    Years: 40.00    Pack years: 10.00    Quit date: 11/06/2018    Years since quitting: 0.4  . Smokeless tobacco: Never Used  . Tobacco comment: electronic cigarettes, 6/1/2020pt states on and off  Substance and Sexual Activity  . Alcohol use: No    Alcohol/week: 0.0 standard drinks  . Drug use: No  . Sexual activity: Not Currently    Birth  control/protection: None  Lifestyle  . Physical activity    Days per week: Not on file    Minutes per session: Not on file  . Stress: Not on file  Relationships  . Social Herbalist on phone: Not on file    Gets together: Not on file    Attends religious service: Not on file    Active member of club or organization: Not on file    Attends meetings of clubs or organizations: Not on file    Relationship status: Not on file  . Intimate partner violence    Fear of current or ex partner: Not on file    Emotionally abused: Not on file    Physically abused: Not on file    Forced sexual activity: Not on file  Other Topics Concern  . Not on file  Social History Narrative  . Not on file    Current Outpatient Medications on File Prior to Visit  Medication Sig Dispense Refill  . aspirin 81 MG tablet Take 81 mg by mouth daily.      Marland Kitchen atenolol-chlorthalidone (TENORETIC) 50-25 MG tablet Take 1 tablet by mouth daily. 90 tablet 4  . atorvastatin (LIPITOR) 80 MG tablet TAKE 1 TABLET (80 MG TOTAL) BY MOUTH NIGHTLY 100 tablet 4  .  glucose blood (CONTOUR NEXT TEST) test strip 1 each by Other route 2 (two) times daily. And lancets 2/day 200 each 3  . insulin NPH Human (HUMULIN N) 100 UNIT/ML injection Inject 0.4 mLs (40 Units total) into the skin daily. 12 mL 1  . Insulin Syringe-Needle U-100 (INSULIN SYRINGE 1CC/30GX5/16") 30G X 5/16" 1 ML MISC 1 Syringe by Does not apply route daily. Use to inject insulin daily 100 each 0  . lisinopril (PRINIVIL,ZESTRIL) 5 MG tablet TAKE 1 TABLET (5 MG TOTAL) BY MOUTH DAILY. 100 tablet 3  . metFORMIN (GLUCOPHAGE) 1000 MG tablet Take 1 tablet (1,000 mg total) by mouth 2 (two) times daily. 200 tablet 4  . nicotine (NICODERM CQ) 7 mg/24hr patch Place 1 patch (7 mg total) onto the skin daily. 28 patch 0   No current facility-administered medications on file prior to visit.     Allergies  Allergen Reactions  . Latex     REACTION: Rash    Family History   Problem Relation Age of Onset  . Diabetes Mother   . Heart attack Father   . Diabetes Father     BP (!) 164/82 (BP Location: Left Arm, Patient Position: Sitting, Cuff Size: Normal)   Pulse 69   Ht 5\' 7"  (1.702 m)   Wt 180 lb 12.8 oz (82 kg)   SpO2 98%   BMI 28.32 kg/m    Review of Systems He denies hypoglycemia    Objective:   Physical Exam VITAL SIGNS:  See vs page GENERAL: no distress Pulses: dorsalis pedis intact bilat.   MSK: no deformity of the feet CV: no leg edema Skin:  no ulcer on the feet.  normal color and temp on the feet. Neuro: sensation is intact to touch on the feet.  Ext: there is bilateral onychomycosis of the toenails, and several toenails are ingrown.  Lab Results  Component Value Date   CREATININE 0.81 07/01/2018   BUN 22 07/01/2018   NA 139 07/01/2018   K 3.6 07/01/2018   CL 100 07/01/2018   CO2 30 07/01/2018      Assessment & Plan:  HTN: is noted today Insulin-requiring type 2 DM: glycemic control is apparnetly improved Noncompliance with cbg's and insulin, apparently better. He is not a candidate for multiple daily injections.     Patient Instructions  Your blood pressure is high today.  Please see your primary care provider soon, to have it rechecked Please start the new insulin, 40 units each morning. If it is good, please come back for a follow-up appointment in 2 months check your blood sugar twice a day.  vary the time of day when you check, between before the 3 meals, and at bedtime.  also check if you have symptoms of your blood sugar being too high or too low.  please keep a record of the readings and bring it to your next appointment here (or you can bring the meter itself).  You can write it on any piece of paper.  please call us sooner if your blood sugar goes below 70, or if you have a lot of readings over 200.

## 2019-04-24 NOTE — Patient Instructions (Addendum)
Your blood pressure is high today.  Please see your primary care provider soon, to have it rechecked Please start the new insulin, 40 units each morning. If it is good, please come back for a follow-up appointment in 2 months check your blood sugar twice a day.  vary the time of day when you check, between before the 3 meals, and at bedtime.  also check if you have symptoms of your blood sugar being too high or too low.  please keep a record of the readings and bring it to your next appointment here (or you can bring the meter itself).  You can write it on any piece of paper.  please call us sooner if your blood sugar goes below 70, or if you have a lot of readings over 200.

## 2019-06-16 NOTE — Progress Notes (Signed)
Subjective:    Patient ID: Tyler Wolf, male    DOB: 14-Dec-1955, 63 y.o.   MRN: [id number]   HPI:  11/27/2018 OV: [name] is here for 3 month f/u:T2D, HLD, HTN He stopped tobacco use 3 weeks ago- [employer]! He reports AM BS 200-270 He reports forgetting to take metformin and lantus as least 1-2 times per week due to fluctuating work schedule He reports reducing sugar in diet and rarely eats CHO at evening meal- mostly vegetables and protein  He has upcoming OV with Endo/Dr. Garnette Scheuermann 12/02/2018, hopefully A1c will be much improved, last 09/23/2018- 10.0 He denies CP/dyspnea/dizziness/HA/palpitations He estimates to drink 30-40 oz plain water, prefers to hydrate with unsweetened tea  03/17/2019 OV: [name] presents for CPE He reports medication compliance, denies SE He reports AM BG 175-265, poorly controlled due to his self reported "sweet tooth" He denies episodes of hypoglycemia Followed by Dr. Otilio Saber,. 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 He resumed tobacco use- 1/2 pack/day- requests Nicoderm. He reports "smokers cough", denies fever/myaglia's/N/V/D He denies recent travel or known exposure to [place] Maintenance: Colonoscopy-UTD, last 09/26/2010 AAA Screening- N/A due to age LDCT- pt declined Immunizations- UTD  06/18/2019 OV: Mr. [name] is here for 3 month f/u:HTN, T2D, HLD, ED, Tobacco use He is currently smoking 8-10 cigarettes/day- has been suing Nicoderm 14mg  patch for few weeks-interested in stronger strength to "finally kick this". Lab Results  Component Value Date   HGBA1C 9.3 (A) 03/26/2019   HGBA1C 10.7 (A) 02/04/2019   HGBA1C 10.5 (A) 12/05/2018   Followed by Dr. 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  He was evaluated by company medical examiner- treated with rest and exercises. He denies numbness/tingling in L hand. He is R hand dominant  Fasting labs with A1c obtained today   Patient Care Team    Relationship Specialty Notifications Start End  Esaw Grandchild, NP PCP - General Family Medicine  08/27/18     Patient Active Problem List   Diagnosis Date Noted  . Healthcare maintenance 08/27/2018  . Popliteal cyst, left 07/03/2017  . Diabetes ([place]) 06/04/2014  . HYPERLIPIDEMIA, MIXED 05/27/2007  . 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 Essential hypertension 05/17/2007     Past Medical History:  Diagnosis Date  . DIABETES MELLITUS, TYPE II 05/17/2007  . DIVERTICULITIS, ACUTE 08/17/2010  . 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 HYPERLIPIDEMIA, MIXED 05/27/2007  . HYPERTENSION 05/17/2007  . TOBACCO ABUSE 05/27/2007     No past surgical history on file.   Family History  Problem Relation Age of Onset  . Diabetes Mother   . Heart attack Father   . Diabetes Father      Social History   Substance and Sexual Activity  Drug Use No     Social History   Substance and Sexual Activity  Alcohol Use No  . Alcohol/week: 0.0 standard drinks     Social History  Tobacco Use  Smoking Status Former Smoker  . Packs/day: 0.25  . Years: 40.00  . Pack years: 10.00  . Quit date: 11/06/2018  . Years since quitting: 0.6  Smokeless Tobacco Never Used  Tobacco Comment   electronic cigarettes, 6/1/2020pt states on and off     Outpatient Encounter Medications as of 06/18/2019  Medication Sig  . aspirin 81 MG tablet Take 81 mg by mouth daily.    Marland Kitchen atenolol-chlorthalidone (TENORETIC) 50-25 MG tablet Take 1 tablet by  mouth daily.  Marland Kitchen atorvastatin (LIPITOR) 80 MG tablet TAKE 1 TABLET (80 MG TOTAL) BY MOUTH NIGHTLY  . glucose blood (CONTOUR NEXT TEST) test strip 1 each by Other route 2 (two) times daily. And lancets 2/day  . insulin NPH Human (HUMULIN N) 100 UNIT/ML injection Inject 0.4 mLs (40 Units total) into the skin daily.  . Insulin Syringe-Needle U-100 (INSULIN SYRINGE 1CC/30GX5/16") 30G X 5/16" 1 ML MISC 1 Syringe by Does not apply route daily. Use to inject insulin daily  . lisinopril (PRINIVIL,ZESTRIL) 5 MG tablet TAKE 1 TABLET (5 MG TOTAL) BY MOUTH DAILY.  . metFORMIN (GLUCOPHAGE) 1000 MG tablet Take 1 tablet (1,000 mg total) by mouth 2 (two) times daily.  . nicotine (NICODERM CQ - DOSED IN MG/24 HOURS) 21 mg/24hr patch Place 1 patch (21 mg total) onto the skin daily.  . [DISCONTINUED] nicotine (NICODERM CQ) 7 mg/24hr patch Place 1 patch (7 mg total) onto the skin daily.   No facility-administered encounter medications on file as of 06/18/2019.     Allergies: Latex  Body mass index is 27.38 kg/m.  Blood pressure 118/75, pulse 60, temperature 98.2 F (36.8 C), resp. rate 14, height 5\' 7"  (1.702 m), weight 174 lb 12.8 oz (79.3 kg), SpO2 98 %.  Review of Systems  Constitutional: Positive for fatigue. Negative for activity change, appetite change, chills, diaphoresis, fever and unexpected weight change.  HENT: Negative for congestion.   Eyes: Negative for visual disturbance.  Respiratory: Negative for cough, chest tightness, shortness of breath, wheezing and stridor.   Cardiovascular: Negative for chest pain, palpitations and leg swelling.  Musculoskeletal: Positive for arthralgias and myalgias. Negative for back pain, gait problem, joint swelling, neck pain and neck stiffness.  Skin: Negative for color change, pallor, rash and wound.  Neurological: Negative for dizziness and headaches.  Hematological: Negative for adenopathy. Does not bruise/bleed easily.  Psychiatric/Behavioral: Negative for  agitation, behavioral problems, confusion, decreased concentration, dysphoric mood, hallucinations, self-injury, sleep disturbance and suicidal ideas. The patient is not nervous/anxious and is not hyperactive.        Objective:   Physical Exam Vitals signs and nursing note reviewed.  Constitutional:      General: He is not in acute distress.    Appearance: Normal appearance. He is normal weight. He is not ill-appearing, toxic-appearing or diaphoretic.  HENT:     Head: Normocephalic and atraumatic.  Cardiovascular:     Rate and Rhythm: Normal rate and regular rhythm.     Pulses: Normal pulses.     Heart sounds: Normal heart sounds. No murmur. No friction rub. No gallop.   Pulmonary:     Breath sounds: Examination of the right-lower field reveals decreased breath sounds. Examination of the left-lower field reveals decreased breath sounds. Decreased breath sounds present.  Neurological:     Mental Status: He is alert.        Assessment & Plan:   1. Diabetes mellitus without complication ([place])   2. HYPERLIPIDEMIA, MIXED   3.  Essential hypertension   4. Healthcare maintenance   5. Type 2 diabetes mellitus without complication, with long-term current use of insulin ([place])     Healthcare maintenance Continue all medications as directed, with one change- Increased Nicoderm to 21mg  patch- use per pack instructions. YOU CAN DO IT! Remain well hydrated, follow diabetic diet. Remain as active as possible. Follow-up with Endo/Dr. Loanne Drilling as directed. We will call you when lab results are available. If left shoulder pain worsens-call and we will refer to Physical Therapy. Continue to social distance and wear a mask when in public. Follow-up in 3-4 months.  Essential hypertension BP at goal 118/75, HR 60 Continue Lisinorpil 5mg  QD, Atenolol/Chlorthalidone 50/25mg  QD  Diabetes ([facility]) Lab Results  Component Value Date   HGBA1C 9.3 (A) 03/26/2019   HGBA1C 10.7 (A) 02/04/2019    HGBA1C 10.5 (A) 12/05/2018   A1c obtained with labs- Followed by Dr. Loanne Drilling Currently on NPH 40 U QAM, Metformin 1000mg  BID He reports AM BG 130-170s, denies episodes of hypoglycemia   HYPERLIPIDEMIA, MIXED Lipid panel obtained today Currently on Lipitor 80mg  QD    FOLLOW-UP:  Return in about 3 months (around 09/17/2019) for HTN, Hypercholestermia, Diabetes.

## 2019-06-18 ENCOUNTER — Encounter: Payer: Self-pay | Admitting: Adult Health

## 2019-06-18 ENCOUNTER — Ambulatory Visit: Payer: BLUE CROSS/BLUE SHIELD | Admitting: Adult Health

## 2019-06-18 ENCOUNTER — Other Ambulatory Visit: Payer: Self-pay

## 2019-06-18 VITALS — BP 118/75 | HR 60 | Temp 98.2°F | Resp 14 | Ht 67.0 in | Wt 174.8 lb

## 2019-06-18 DIAGNOSIS — E782 Mixed hyperlipidemia: Secondary | ICD-10-CM

## 2019-06-18 DIAGNOSIS — I10 Essential (primary) hypertension: Secondary | ICD-10-CM | POA: Diagnosis not present

## 2019-06-18 DIAGNOSIS — Z0000 Encounter for general adult medical examination without abnormal findings: Secondary | ICD-10-CM

## 2019-06-18 DIAGNOSIS — Z794 Long term (current) use of insulin: Secondary | ICD-10-CM

## 2019-06-18 DIAGNOSIS — E119 Type 2 diabetes mellitus without complications: Secondary | ICD-10-CM

## 2019-06-18 LAB — POCT UA - MICROALBUMIN
Albumin/Creatinine Ratio, Urine, POC: <30
Creatinine, POC: 200 mg/dL
Microalbumin Ur, POC: 30 mg/L

## 2019-06-18 MED ORDER — NICOTINE 21 MG/24HR TD PT24: 21.0000 mg | MEDICATED_PATCH | Freq: Every day | TRANSDERMAL | 0 refills | Status: DC

## 2019-06-18 NOTE — Assessment & Plan Note (Signed)
Lipid panel obtained today Currently on Lipitor 80mg  QD

## 2019-06-18 NOTE — Patient Instructions (Addendum)
Diabetes Mellitus and Nutrition, Adult When you have diabetes (diabetes mellitus), it is very important to have healthy eating habits because your blood sugar (glucose) levels are greatly affected by what you eat and drink. Eating healthy foods in the appropriate amounts, at about the same times every day, can help you:  Control your blood glucose.  Lower your risk of heart disease.  Improve your blood pressure.  Reach or maintain a healthy weight. Every person with diabetes is different, and each person has different needs for a meal plan. Your health care provider may recommend that you work with a diet and nutrition specialist (dietitian) to make a meal plan that is best for you. Your meal plan may vary depending on factors such as:  The calories you need.  The medicines you take.  Your weight.  Your blood glucose, blood pressure, and cholesterol levels.  Your activity level.  Other health conditions you have, such as heart or kidney disease. How do carbohydrates affect me? Carbohydrates, also called carbs, affect your blood glucose level more than any other type of food. Eating carbs naturally raises the amount of glucose in your blood. Carb counting is a method for keeping track of how many carbs you eat. Counting carbs is important to keep your blood glucose at a healthy level, especially if you use insulin or take certain oral diabetes medicines. It is important to know how many carbs you can safely have in each meal. This is different for every person. Your dietitian can help you calculate how many carbs you should have at each meal and for each snack. Foods that contain carbs include:  Bread, cereal, rice, pasta, and crackers.  Potatoes and corn.  Peas, beans, and lentils.  Milk and yogurt.  Fruit and juice.  Desserts, such as cakes, cookies, ice cream, and candy. How does alcohol affect me? Alcohol can cause a sudden decrease in blood glucose (hypoglycemia),  especially if you use insulin or take certain oral diabetes medicines. Hypoglycemia can be a life-threatening condition. Symptoms of hypoglycemia (sleepiness, dizziness, and confusion) are similar to symptoms of having too much alcohol. If your health care provider says that alcohol is safe for you, follow these guidelines:  Limit alcohol intake to no more than 1 drink per day for nonpregnant women and 2 drinks per day for men. One drink equals 12 oz of beer, 5 oz of wine, or 1 oz of hard liquor.  Do not drink on an empty stomach.  Keep yourself hydrated with water, diet soda, or unsweetened iced tea.  Keep in mind that regular soda, juice, and other mixers may contain a lot of sugar and must be counted as carbs. What are tips for following this plan?  Reading food labels  Start by checking the serving size on the "Nutrition Facts" label of packaged foods and drinks. The amount of calories, carbs, fats, and other nutrients listed on the label is based on one serving of the item. Many items contain more than one serving per package.  Check the total grams (g) of carbs in one serving. You can calculate the number of servings of carbs in one serving by dividing the total carbs by 15. For example, if a food has 30 g of total carbs, it would be equal to 2 servings of carbs.  Check the number of grams (g) of saturated and trans fats in one serving. Choose foods that have low or no amount of these fats.  Check the number of   milligrams (mg) of salt (sodium) in one serving. Most people should limit total sodium intake to less than 2,300 mg per day.  Always check the nutrition information of foods labeled as "low-fat" or "nonfat". These foods may be higher in added sugar or refined carbs and should be avoided.  Talk to your dietitian to identify your daily goals for nutrients listed on the label. Shopping  Avoid buying canned, premade, or processed foods. These foods tend to be high in fat, sodium,  and added sugar.  Shop around the outside edge of the grocery store. This includes fresh fruits and vegetables, bulk grains, fresh meats, and fresh dairy. Cooking  Use low-heat cooking methods, such as baking, instead of high-heat cooking methods like deep frying.  Cook using healthy oils, such as olive, canola, or sunflower oil.  Avoid cooking with butter, cream, or high-fat meats. Meal planning  Eat meals and snacks regularly, preferably at the same times every day. Avoid going long periods of time without eating.  Eat foods high in fiber, such as fresh fruits, vegetables, beans, and whole grains. Talk to your dietitian about how many servings of carbs you can eat at each meal.  Eat 4-6 ounces (oz) of lean protein each day, such as lean meat, chicken, fish, eggs, or tofu. One oz of lean protein is equal to: ? 1 oz of meat, chicken, or fish. ? 1 egg. ?  cup of tofu.  Eat some foods each day that contain healthy fats, such as avocado, nuts, seeds, and fish. Lifestyle  Check your blood glucose regularly.  Exercise regularly as told by your health care provider. This may include: ? 150 minutes of moderate-intensity or vigorous-intensity exercise each week. This could be brisk walking, biking, or water aerobics. ? Stretching and doing strength exercises, such as yoga or weightlifting, at least 2 times a week.  Take medicines as told by your health care provider.  Do not use any products that contain nicotine or tobacco, such as cigarettes and e-cigarettes. If you need help quitting, ask your health care provider.  Work with a Social worker or diabetes educator to identify strategies to manage stress and any emotional and social challenges. Questions to ask a health care provider  Do I need to meet with a diabetes educator?  Do I need to meet with a dietitian?  What number can I call if I have questions?  When are the best times to check my blood glucose? Where to find more  information:  American Diabetes Association: diabetes.org  Academy of Nutrition and Dietetics: www.eatright.[employer] of Diabetes and Digestive and Kidney Diseases (NIH): [employer] Summary  A healthy meal plan will help you control your blood glucose and maintain a healthy lifestyle.  Working with a diet and nutrition specialist (dietitian) can help you make a meal plan that is best for you.  Keep in mind that carbohydrates (carbs) and alcohol have immediate effects on your blood glucose levels. It is important to count carbs and to use alcohol carefully. This information is not intended to replace advice given to you by your health care provider. Make sure you discuss any questions you have with your health care provider. Document Released: 06/29/2005 Document Revised: 09/14/2017 Document Reviewed: 11/06/2016 Elsevier Patient Education  2020 [place] all medications as directed, with one change- Increased Nicoderm to 21mg  patch- use per pack instructions. YOU CAN DO IT! Remain well hydrated, follow diabetic diet. Remain as active as possible. Follow-up with  Endo/Dr. Loanne Drilling as directed. We will call you when lab results are available. If left shoulder pain worsens-call and we will refer to Physical Therapy. Continue to social distance and wear a mask when in public. Follow-up in 3-4 months. GREAT TO SEE YOU!

## 2019-06-18 NOTE — Assessment & Plan Note (Addendum)
BP at goal 118/75, HR 60 Continue Lisinorpil 5mg  QD, Atenolol/Chlorthalidone 50/25mg  QD

## 2019-06-18 NOTE — Assessment & Plan Note (Signed)
Continue all medications as directed, with one change- Increased Nicoderm to 21mg  patch- use per pack instructions. YOU CAN DO IT! Remain well hydrated, follow diabetic diet. Remain as active as possible. Follow-up with Endo/Dr. Loanne Drilling as directed. We will call you when lab results are available. If left shoulder pain worsens-call and we will refer to Physical Therapy. Continue to social distance and wear a mask when in public. Follow-up in 3-4 months.

## 2019-06-18 NOTE — Assessment & Plan Note (Signed)
Lab Results  Component Value Date   HGBA1C 9.3 (A) 03/26/2019   HGBA1C 10.7 (A) 02/04/2019   HGBA1C 10.5 (A) 12/05/2018   A1c obtained with labs- Followed by Dr. Loanne Drilling Currently on NPH 40 U QAM, Metformin 1000mg  BID He reports AM BG 130-170s, denies episodes of hypoglycemia

## 2019-06-18 NOTE — Assessment & Plan Note (Signed)
>>  ASSESSMENT AND PLAN FOR HYPERLIPIDEMIA, MIXED WRITTEN ON 06/18/2019  9:27 AM BY DANFORD, KATY D, NP  Lipid panel obtained today Currently on Lipitor 80mg  QD

## 2019-06-19 LAB — HEMOGLOBIN A1C
Est. average glucose Bld gHb Est-mCnc: 249 mg/dL
Hgb A1c MFr Bld: 10.3 % — ABNORMAL HIGH (ref 4.8–5.6)

## 2019-06-19 LAB — CBC WITH DIFFERENTIAL/PLATELET
Basophils Absolute: 0.1 10*3/uL (ref 0.0–0.2)
Basos: 1 %
EOS (ABSOLUTE): 0.2 10*3/uL (ref 0.0–0.4)
Eos: 3 %
Hematocrit: 44.9 % (ref 37.5–51.0)
Hemoglobin: 15.7 g/dL (ref 13.0–17.7)
Immature Grans (Abs): 0.1 10*3/uL (ref 0.0–0.1)
Immature Granulocytes: 1 %
Lymphocytes Absolute: 1.9 10*3/uL (ref 0.7–3.1)
Lymphs: 25 %
MCH: 30.8 pg (ref 26.6–33.0)
MCHC: 35 g/dL (ref 31.5–35.7)
MCV: 88 fL (ref 79–97)
Monocytes Absolute: 0.7 10*3/uL (ref 0.1–0.9)
Monocytes: 9 %
Neutrophils Absolute: 4.6 10*3/uL (ref 1.4–7.0)
Neutrophils: 61 %
Platelets: 256 10*3/uL (ref 150–450)
RBC: 5.1 x10E6/uL (ref 4.14–5.80)
RDW: 12.6 % (ref 11.6–15.4)
WBC: 7.5 10*3/uL (ref 3.4–10.8)

## 2019-06-19 LAB — TSH: TSH: 2.19 u[IU]/mL (ref 0.450–4.500)

## 2019-06-19 LAB — COMPREHENSIVE METABOLIC PANEL
ALT: 25 IU/L (ref 0–44)
AST: 17 IU/L (ref 0–40)
Albumin/Globulin Ratio: 1.7 (ref 1.2–2.2)
Albumin: 4.5 g/dL (ref 3.8–4.8)
Alkaline Phosphatase: 76 IU/L (ref 39–117)
BUN/Creatinine Ratio: 23 (ref 10–24)
BUN: 16 mg/dL (ref 8–27)
Bilirubin Total: 0.6 mg/dL (ref 0.0–1.2)
CO2: 29 mmol/L (ref 20–29)
Calcium: 9.5 mg/dL (ref 8.6–10.2)
Chloride: 97 mmol/L (ref 96–106)
Creatinine, Ser: 0.69 mg/dL — ABNORMAL LOW (ref 0.76–1.27)
GFR calc Af Amer: 118 mL/min/{1.73_m2} (ref >59)
GFR calc non Af Amer: 102 mL/min/{1.73_m2} (ref >59)
Globulin, Total: 2.6 g/dL (ref 1.5–4.5)
Glucose: 127 mg/dL — ABNORMAL HIGH (ref 65–99)
Potassium: 4.5 mmol/L (ref 3.5–5.2)
Sodium: 139 mmol/L (ref 134–144)
Total Protein: 7.1 g/dL (ref 6.0–8.5)

## 2019-06-19 LAB — LIPID PANEL
Chol/HDL Ratio: 3.6 ratio (ref 0.0–5.0)
Cholesterol, Total: 130 mg/dL (ref 100–199)
HDL: 36 mg/dL — ABNORMAL LOW (ref >39)
LDL Chol Calc (NIH): 73 mg/dL (ref 0–99)
Triglycerides: 117 mg/dL (ref 0–149)
VLDL Cholesterol Cal: 21 mg/dL (ref 5–40)

## 2019-06-19 LAB — LDL CHOLESTEROL, DIRECT: LDL Direct: 73 mg/dL (ref 0–99)

## 2019-06-24 ENCOUNTER — Other Ambulatory Visit: Payer: Self-pay

## 2019-06-26 ENCOUNTER — Encounter: Payer: Self-pay | Admitting: Endocrinology

## 2019-06-26 ENCOUNTER — Ambulatory Visit: Payer: BC Managed Care – PPO | Admitting: Endocrinology

## 2019-06-26 ENCOUNTER — Other Ambulatory Visit: Payer: Self-pay

## 2019-06-26 DIAGNOSIS — E119 Type 2 diabetes mellitus without complications: Secondary | ICD-10-CM

## 2019-06-26 DIAGNOSIS — Z794 Long term (current) use of insulin: Secondary | ICD-10-CM

## 2019-06-26 DIAGNOSIS — Z23 Encounter for immunization: Secondary | ICD-10-CM | POA: Diagnosis not present

## 2019-06-26 MED ORDER — INSULIN NPH (HUMAN) (ISOPHANE) 100 UNIT/ML ~~LOC~~ SUSP: 50.0000 [IU] | Freq: Every day | SUBCUTANEOUS | 11 refills | Status: DC

## 2019-06-26 NOTE — Patient Instructions (Addendum)
Please increase the insulin to 50 units each morning.   On this type of insulin schedule, you should eat meals on a regular schedule.  If a meal is missed or significantly delayed, your blood sugar could go low. If it is good, please come back for a follow-up appointment in 2 months.   check your blood sugar twice a day.  vary the time of day when you check, between before the 3 meals, and at bedtime.  also check if you have symptoms of your blood sugar being too high or too low.  please keep a record of the readings and bring it to your next appointment here (or you can bring the meter itself).  You can write it on any piece of paper.  please call us sooner if your blood sugar goes below 70, or if you have a lot of readings over 200.

## 2019-06-26 NOTE — Progress Notes (Signed)
Subjective:    Patient ID: Tyler Wolf, male    DOB: May 12, 1956, 63 y.o.   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   DKA: never Severe hypoglycemia: never Pancreatitis: never Pancreatic imaging: normal on 2007 CT Other: due to missing insulin, he is not a candidate for multiple daily injections; he changed Lantus to NPH, due to pattern of cbg's.   Interval history: Pt says he does not miss the insulin.  Meter is downloaded today, and the printout is scanned into the record.  cbg varies from 142-205.  All are checked in the morning.  There are only 3 cbg's  pt states he feels well in general.  He take NPH, 40 units qam.   Past Medical History:  Diagnosis Date  . DIABETES MELLITUS, TYPE II 05/17/2007  . DIVERTICULITIS, ACUTE 08/17/2010  . 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 HYPERLIPIDEMIA, MIXED 05/27/2007  . HYPERTENSION 05/17/2007  . TOBACCO ABUSE 05/27/2007    History reviewed. No pertinent surgical history.  Social History   Socioeconomic History  . Marital status: Married    Spouse name: Not on file  . Number of children: Not on file  . Years of education: Not on file  . Highest education level: Not on file  Occupational History  . Not on file  Social Needs  . Financial resource strain: Not on file  . Food insecurity    Worry: Not on file    Inability: Not on file  . Transportation needs    Medical: Not on file    Non-medical: Not on file  Tobacco Use  . Smoking status: Former Smoker    Packs/day: 0.25    Years: 40.00    Pack years: 10.00    Quit date: 11/06/2018    Years since quitting: 0.6  . Smokeless tobacco: Never Used  . Tobacco comment: electronic cigarettes, 6/1/2020pt states on and off  Substance and Sexual Activity  . Alcohol use: No    Alcohol/week: 0.0  standard drinks  . Drug use: No  . Sexual activity: Not Currently    Birth control/protection: None  Lifestyle  . Physical activity    Days per week: Not on file    Minutes per session: Not on file  . Stress: Not on file  Relationships  . Social Herbalist on phone: Not on file    Gets together: Not on file    Attends religious service: Not on file    Active member of club or organization: Not on file    Attends meetings of clubs or organizations: Not on file    Relationship status: Not on file  . Intimate partner violence    Fear of current or ex partner: Not on file    Emotionally abused: Not on file    Physically abused: Not on file    Forced sexual activity: Not on file  Other Topics Concern  . Not on file  Social History Narrative  . Not on file    Current Outpatient Medications on File Prior to Visit  Medication Sig Dispense Refill  . aspirin 81 MG tablet Take 81 mg by mouth daily.      Marland Kitchen atenolol-chlorthalidone (TENORETIC) 50-25 MG tablet Take 1 tablet by mouth daily. 90 tablet 4  . atorvastatin (LIPITOR) 80 MG tablet  TAKE 1 TABLET (80 MG TOTAL) BY MOUTH NIGHTLY 100 tablet 4  . glucose blood (CONTOUR NEXT TEST) test strip 1 each by Other route 2 (two) times daily. And lancets 2/day 200 each 3  . Insulin Syringe-Needle U-100 (INSULIN SYRINGE 1CC/30GX5/16") 30G X 5/16" 1 ML MISC 1 Syringe by Does not apply route daily. Use to inject insulin daily 100 each 0  . lisinopril (PRINIVIL,ZESTRIL) 5 MG tablet TAKE 1 TABLET (5 MG TOTAL) BY MOUTH DAILY. 100 tablet 3  . metFORMIN (GLUCOPHAGE) 1000 MG tablet Take 1 tablet (1,000 mg total) by mouth 2 (two) times daily. 200 tablet 4  . nicotine (NICODERM CQ - DOSED IN MG/24 HOURS) 21 mg/24hr patch Place 1 patch (21 mg total) onto the skin daily. 7 patch 0   No current facility-administered medications on file prior to visit.     Allergies  Allergen Reactions  . Latex     REACTION: Rash    Family History  Problem  Relation Age of Onset  . Diabetes Mother   . Heart attack Father   . Diabetes Father     BP 118/70 (BP Location: Left Arm, Patient Position: Sitting, Cuff Size: Normal)   Pulse 76   Ht 5\' 7"  (1.702 m)   Wt 172 lb 9.6 oz (78.3 kg)   SpO2 99%   BMI 27.03 kg/m   Review of Systems He denies hypoglycemia    Objective:   Physical Exam VITAL SIGNS:  See vs page GENERAL: no distress Pulses: dorsalis pedis intact bilat.   MSK: no deformity of the feet CV: no leg edema Skin:  no ulcer on the feet.  normal color and temp on the feet. Neuro: sensation is intact to touch on the feet Ext: there is bilateral onychomycosis of the toenails  Lab Results  Component Value Date   HGBA1C 10.3 (H) 06/18/2019    Lab Results  Component Value Date   CREATININE 0.69 (L) 06/18/2019   BUN 16 06/18/2019   NA 139 06/18/2019   K 4.5 06/18/2019   CL 97 06/18/2019   CO2 29 06/18/2019       Assessment & Plan:  Insulin-requiring type 2 DM: worse.    Patient Instructions  Please increase the insulin to 50 units each morning.   On this type of insulin schedule, you should eat meals on a regular schedule.  If a meal is missed or significantly delayed, your blood sugar could go low. If it is good, please come back for a follow-up appointment in 2 months.   check your blood sugar twice a day.  vary the time of day when you check, between before the 3 meals, and at bedtime.  also check if you have symptoms of your blood sugar being too high or too low.  please keep a record of the readings and bring it to your next appointment here (or you can bring the meter itself).  You can write it on any piece of paper.  please call us sooner if your blood sugar goes below 70, or if you have a lot of readings over 200.

## 2019-07-01 ENCOUNTER — Telehealth: Payer: Self-pay | Admitting: Adult Health

## 2019-07-01 DIAGNOSIS — M25512 Pain in left shoulder: Secondary | ICD-10-CM

## 2019-07-01 DIAGNOSIS — G8929 Other chronic pain: Secondary | ICD-10-CM

## 2019-07-01 NOTE — Addendum Note (Signed)
Addended by: Fonnie Mu on: 07/01/2019 01:20 PM   Modules accepted: Orders

## 2019-07-01 NOTE — Telephone Encounter (Signed)
Patient called stating he has been dealing with some left shoulder pain and is requesting a PT referral if possible to [facility]. He states he has mentioned it in the past to PCP about it but was manageable, but now he would like to have something done to help.

## 2019-07-15 ENCOUNTER — Ambulatory Visit: Payer: BC Managed Care – PPO | Attending: Adult Health | Admitting: Physical Therapy

## 2019-07-15 ENCOUNTER — Other Ambulatory Visit: Payer: Self-pay

## 2019-07-15 DIAGNOSIS — M25612 Stiffness of left shoulder, not elsewhere classified: Secondary | ICD-10-CM | POA: Diagnosis not present

## 2019-07-15 DIAGNOSIS — M6281 Muscle weakness (generalized): Secondary | ICD-10-CM | POA: Diagnosis not present

## 2019-07-15 DIAGNOSIS — M25512 Pain in left shoulder: Secondary | ICD-10-CM | POA: Insufficient documentation

## 2019-07-15 DIAGNOSIS — G8929 Other chronic pain: Secondary | ICD-10-CM | POA: Insufficient documentation

## 2019-07-15 NOTE — Therapy (Signed)
[place], [place], [address] Phone: [phone]   Fax:  [phone]  Physical Therapy Evaluation  Patient Details  Name: Tyler Wolf MRN: [id number] Date of Birth: [birth year] Referring Provider (PT): Mina Marble D NP   Encounter Date: 07/15/2019  PT End of Session - 07/15/19 0759    Visit Number  1    Number of Visits  13    Date for PT Re-Evaluation  08/26/19    Authorization Type  BCBS    PT Start Time  0800    PT Stop Time  0844    PT Time Calculation (min)  44 min    Activity Tolerance  Patient tolerated treatment well    Behavior During Therapy  [facility] for tasks assessed/performed       Past Medical History:  Diagnosis Date  . DIABETES MELLITUS, TYPE II 05/17/2007  . DIVERTICULITIS, ACUTE 08/17/2010  . 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 HYPERLIPIDEMIA, MIXED 05/27/2007  . HYPERTENSION 05/17/2007  . TOBACCO ABUSE 05/27/2007    No past surgical history on file.  There were no vitals filed for this visit.   Subjective Assessment - 07/15/19 0819    Subjective  Around August 1st I was picking up plywood and I heard a pop in my left shoulder    Pertinent History  DM hyperlipidemia    Limitations  Lifting;Other (comment)   can sleep on shoulder , pressure sometimes feels better   Diagnostic tests  none    Patient Stated Goals  I want to be pain free and work at my job.  I need to be able to lift  75 pounds    Currently in Pain?  Yes    Pain Score  3     Pain Location  Shoulder    Pain Orientation  Left    Pain Descriptors / Indicators  Aching;Dull    Pain Type  Chronic pain    Pain Onset  More than a month ago    Pain Frequency  Intermittent    Aggravating Factors   work at Architect. 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   No    Is the patient reluctant to leave their home because of a fear of falling?   No      Home Environment   Living Environment  Private residence    Living Arrangements  Spouse/significant other    Type of [place] to enter    Entrance Stairs-Number of Steps  3    Entrance Stairs-Rails  Left    [place]  One level      Prior Function   Level of Independence  Independent      Cognition   Overall Cognitive Status  Within Functional Limits for tasks assessed      Observation/Other Assessments   Focus on Therapeutic Outcomes (FOTO)   FOTO intake 64% limitation 36% predicted 27%      Posture/Postural Control   Posture/Postural Control  Postural limitations    Postural Limitations  Rounded Shoulders;Forward head      ROM / Strength   AROM / PROM / Strength  AROM;Strength      AROM   Overall AROM   Deficits    Right Shoulder Flexion  156 Degrees    Right Shoulder ABduction  150 Degrees    Right Shoulder Internal Rotation  55 Degrees    Right Shoulder External Rotation  95 Degrees    Left Shoulder Extension  41 Degrees    Left Shoulder Flexion  130 Degrees    Left Shoulder ABduction  110 Degrees    Left Shoulder Internal Rotation  25 Degrees    Left Shoulder External Rotation  85 Degrees      Strength   Overall Strength  Deficits    Right Shoulder Flexion  5/5    Right Shoulder ABduction  5/5    Right Shoulder Internal Rotation  5/5    Right Shoulder External Rotation  5/5    Left Shoulder Flexion  4+/5    Left Shoulder ABduction  4/5    Left Shoulder Internal Rotation  4/5     Left Shoulder External Rotation  4/5    Right Hand Grip (lbs)  59.3   60,58.60   Left Hand Grip (lbs)  67   68 68 65               Objective measurements completed on examination: See above findings.      [facility] Adult PT Treatment/Exercise - 07/15/19 0803      Shoulder Exercises: Standing   External Rotation  Both;Strengthening;10 reps;Theraband    Theraband Level (Shoulder External Rotation)  Level 2 (Red)    External Rotation Limitations  x2 VC and TC    Extension  Strengthening;Both;10 reps    Theraband Level (Shoulder Extension)  Level 2 (Red)    Extension Limitations  x2 VC annd TC    Row  Strengthening;Both;10 reps    Theraband Level (Shoulder Row)  Level 2 (Red)    Row Limitations  x 2 VC and TC             PT Education - 07/15/19 [phone]    Education Details  POC Explanation of findings  inital HEP with red theraband    Person(s) Educated  Patient    Methods  Explanation;Demonstration;Tactile cues;Verbal cues;Handout    Comprehension  Verbalized understanding;Returned demonstration       PT Short Term Goals - 07/15/19 0826      PT SHORT TERM GOAL #1   Title  Pt will be independent with initial HEP    Baseline  no knowledge    Time  2    Period  Weeks    Status  New    Target Date  07/29/19      PT SHORT TERM GOAL #2   Title  Pt will be able to lift 15 pounds overhead in 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    Baseline  no knowledge    Time  6    Period  Weeks    Status  New    Target Date  08/26/19      PT LONG TERM GOAL #2   Title  left shoulder IR  will return to [facility] to return to pain-free ADLs such as dressing and grooming.    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    Baseline  eval 36 % limitation    Time  6    Period  Weeks    Status  New    Target Date  08/26/19      PT LONG TERM GOAL #5   Title  LT shoulder AROM scaption will improve to 0-155 degrees for improved overhead reaching.    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  He said he heard a pop in his shoulder and he has dull aching pain of 3/10 in Anterior left shoulder.  Pt exhibits forward head posture and rounded shoulders.  Pt with slightly decreased MMT and AROM in left shoulder but has increased grip strength.Pt needs to be able to carry 75# in order to perform job duties and he is unable to do that presently. Pt also has difficulty dressing and donning shirts.  Pt would benefit from skilled PT to address impairments in AROM, muscle strength and pain in order to retrun to pain free PLOF    Personal Factors and Comorbidities  Age;Comorbidity 1;Profession    Comorbidities  DM, Hyperlipidemia    Examination-Activity Limitations  Carry;Dressing;Lift;Reach Overhead    Stability/Clinical Decision Making  Stable/Uncomplicated    Clinical Decision Making  Low    Rehab Potential  Excellent    PT Frequency  2x / week    PT Duration  6 weeks    PT Treatment/Interventions  ADLs/Self Care Home  Management;Cryotherapy;Electrical Stimulation;Ultrasound;Moist Heat;Iontophoresis 4mg /ml Dexamethasone;Therapeutic activities;Therapeutic exercise;Neuromuscular re-education;Manual techniques;Patient/family education;Dry needling;Passive range of motion;Taping;Joint Manipulations    PT Next Visit Plan  Manual, progress HEP for shoulder strengthening in order to lift 75# for job    PT Home Exercise Plan  bil rows,  extension and ER    Consulted and Agree with Plan of Care  Patient       Patient will benefit from skilled therapeutic intervention in order to improve the following deficits and impairments:  Decreased range of motion, Decreased strength, Increased fascial restricitons, Increased muscle spasms, Postural dysfunction, Improper body mechanics, Impaired UE functional use, Pain  Visit Diagnosis: Chronic left shoulder pain  Stiffness of left shoulder, not elsewhere classified  Muscle weakness (generalized)     Problem List Patient Active Problem List   Diagnosis Date Noted  . Healthcare maintenance 08/27/2018  . Popliteal cyst, left 07/03/2017  . Diabetes ([place]) 06/04/2014  . HYPERLIPIDEMIA, MIXED 05/27/2007  . 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 Essential hypertension 05/17/2007   Voncille Lo, PT Certified Exercise Expert for the Aging Adult  07/15/19 1:28 PM Phone: [phone] Fax: [place] [address] [place], [place], [address] Phone: [phone]   Fax:  [phone]  Name: AMIRJON BOWLING MRN: [id number] Date of Birth: November 03, 1955

## 2019-07-15 NOTE — Patient Instructions (Signed)
       Voncille Lo, PT Certified Exercise Expert for the Aging Adult  07/15/19 8:33 AM Phone: [phone] Fax: [phone]

## 2019-07-22 ENCOUNTER — Encounter: Payer: Self-pay | Admitting: Physical Therapy

## 2019-07-22 ENCOUNTER — Ambulatory Visit: Payer: BC Managed Care – PPO | Attending: Adult Health | Admitting: Physical Therapy

## 2019-07-22 ENCOUNTER — Other Ambulatory Visit: Payer: Self-pay

## 2019-07-22 DIAGNOSIS — M25612 Stiffness of left shoulder, not elsewhere classified: Secondary | ICD-10-CM | POA: Diagnosis not present

## 2019-07-22 DIAGNOSIS — G8929 Other chronic pain: Secondary | ICD-10-CM | POA: Insufficient documentation

## 2019-07-22 DIAGNOSIS — M25512 Pain in left shoulder: Secondary | ICD-10-CM | POA: Diagnosis not present

## 2019-07-22 DIAGNOSIS — M6281 Muscle weakness (generalized): Secondary | ICD-10-CM | POA: Diagnosis not present

## 2019-07-22 NOTE — Therapy (Addendum)
[place], [place], [address] Phone: [phone]   Fax:  [phone]  Physical Therapy Treatment  Patient Details  Name: Tyler Wolf MRN: [id number] Date of Birth: 03-04-1956 Referring Provider (PT): Mina Marble D NP   Encounter Date: 07/22/2019  PT End of Session - 07/22/19 0805    Visit Number  2    Number of Visits  13    Date for PT Re-Evaluation  08/26/19    Authorization Type  BCBS    PT Start Time  0804    PT Stop Time  0842    PT Time Calculation (min)  38 min       Past Medical History:  Diagnosis Date  . DIABETES MELLITUS, TYPE II 05/17/2007  . DIVERTICULITIS, ACUTE 08/17/2010  . 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 HYPERLIPIDEMIA, MIXED 05/27/2007  . HYPERTENSION 05/17/2007  . TOBACCO ABUSE 05/27/2007    History reviewed. No pertinent surgical history.  There were no vitals filed for this visit.  Subjective Assessment - 07/22/19 0805    Subjective  I am doing the exercises. They are okay.    Currently in Pain?  No/denies    Aggravating Factors   catches with putting shirt on; relieving factors: avoid painful movements                       [facility] Adult PT Treatment/Exercise - 07/22/19 0001      Shoulder Exercises: Supine   Other Supine Exercises  supine cane pullovers      Shoulder Exercises: Sidelying   External Rotation  20 reps    External Rotation Weight (lbs)  2    ABduction  20 reps   AROM     Shoulder Exercises: Standing   Internal Rotation  15 reps    Theraband Level (Shoulder Internal Rotation)  Level 2 (Red)    Flexion Limitations  punch red band x 15     Extension  Strengthening;Both;15 reps    Theraband Level (Shoulder Extension)  Level 2 (Red)    Row  Strengthening;Both;15 reps    Theraband Level (Shoulder Row)  Level 2 (Red)    Other Standing Exercises  standing cane flexion and abduction/scaption      Shoulder Exercises:  ROM/Strengthening   UBE (Upper Arm Bike)  L1 x 2 minutes each way       Manual Therapy   Manual Therapy  Joint mobilization;Passive ROM    Joint Mobilization  A/P and inferior mobs grade II     Passive ROM  Flexion, Abduction, ER, IR              PT Education - 07/22/19 1420    Education Details  HEP    Person(s) Educated  Patient    Methods  Explanation;Handout    Comprehension  Verbalized understanding       PT Short Term Goals - 07/15/19 0826      PT SHORT TERM GOAL #1   Title  Pt will be independent with initial HEP    Baseline  no knowledge    Time  2    Period  Weeks    Status  New    Target Date  07/29/19      PT SHORT TERM GOAL #2   Title  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    Baseline  no knowledge    Time  6    Period  Weeks    Status  New    Target Date  08/26/19      PT LONG TERM GOAL #2   Title  left shoulder IR  will return to [facility] to return to pain-free ADLs such as dressing and grooming.    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    Baseline  eval 36 % limitation    Time  6    Period  Weeks    Status  New    Target Date  08/26/19      PT LONG TERM GOAL #5   Title  LT shoulder AROM scaption  will improve to 0-155 degrees for improved overhead reaching.    Baseline  130 degrees eval    Time  6    Period  Weeks    Status  New    Target Date  08/26/19            Plan - 07/22/19 0947    Clinical Impression Statement  Pt reports he is about the same. Her has pain with reaching overhead to don shirt. Reviewed HEP and progressed with Sidelying shoulder strength. Added AAROM for overhead stretching and updated HEP. No increased pain post session.    PT Next Visit Plan  Manual, progress HEP for shoulder strengthening in order to lift 75# for job    PT Home Exercise Plan  bil rows, extension and ER, supine cane pullovers, standing cane flex and abdct       Patient will benefit from skilled therapeutic intervention in order to improve the following deficits and impairments:  Decreased range of motion, Decreased strength, Increased fascial restricitons, Increased muscle spasms, Postural dysfunction, Improper body mechanics, Impaired UE functional use, Pain  Visit Diagnosis: Chronic left shoulder pain  Stiffness of left shoulder, not elsewhere classified  Muscle weakness (generalized)     Problem List Patient Active Problem List   Diagnosis Date Noted  . Healthcare maintenance 08/27/2018  . Popliteal cyst, left 07/03/2017  . Diabetes ([place]) 06/04/2014  . HYPERLIPIDEMIA, MIXED 05/27/2007  . 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 Essential hypertension 05/17/2007    Dorene Ar, PTA 07/22/2019, 2:20 PM  [facility] [address] [place], [place], [address] Phone: [phone]  Fax:  [phone]  Name: Tyler Wolf MRN: [id number] Date of Birth: 1956/07/31

## 2019-07-25 ENCOUNTER — Other Ambulatory Visit: Payer: Self-pay

## 2019-07-25 ENCOUNTER — Ambulatory Visit: Payer: BC Managed Care – PPO | Admitting: Physical Therapy

## 2019-07-25 ENCOUNTER — Encounter: Payer: Self-pay | Admitting: Physical Therapy

## 2019-07-25 DIAGNOSIS — G8929 Other chronic pain: Secondary | ICD-10-CM

## 2019-07-25 DIAGNOSIS — M6281 Muscle weakness (generalized): Secondary | ICD-10-CM

## 2019-07-25 DIAGNOSIS — M25512 Pain in left shoulder: Secondary | ICD-10-CM | POA: Diagnosis not present

## 2019-07-25 DIAGNOSIS — M25612 Stiffness of left shoulder, not elsewhere classified: Secondary | ICD-10-CM | POA: Diagnosis not present

## 2019-07-25 NOTE — Patient Instructions (Addendum)

## 2019-07-25 NOTE — Therapy (Signed)
[place], [place], [address] Phone: [phone]   Fax:  [phone]  Physical Therapy Treatment  Patient Details  Name: Tyler Wolf MRN: [id number] Date of Birth: 19-Sep-1956 Referring Provider (PT): Mina Marble D NP   Encounter Date: 07/25/2019  PT End of Session - 07/25/19 0846    Visit Number  3    Number of Visits  13    Date for PT Re-Evaluation  08/26/19    Authorization Type  BCBS    PT Start Time  0845    PT Stop Time  0935    PT Time Calculation (min)  50 min    Activity Tolerance  Patient tolerated treatment well    Behavior During Therapy  [facility] for tasks assessed/performed       Past Medical History:  Diagnosis Date  . DIABETES MELLITUS, TYPE II 05/17/2007  . DIVERTICULITIS, ACUTE 08/17/2010  . 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 HYPERLIPIDEMIA, MIXED 05/27/2007  . HYPERTENSION 05/17/2007  . TOBACCO ABUSE 05/27/2007    History reviewed. No pertinent surgical history.  There were no vitals filed for this visit.  Subjective Assessment - 07/25/19 0851    Subjective  I am hurting a little less today but I still have pain    Pertinent History  DM hyperlipidemia    Diagnostic tests  none    Patient Stated Goals  I want to be pain free and work at my job.  I need to be able to lift  75 pounds    Currently in Pain?  Yes    Pain Score  5     Pain Location  Shoulder    Pain Orientation  Left    Pain Descriptors / Indicators  Aching;Dull    Pain Type  Chronic pain    Pain Onset  More than a month ago    Pain Frequency  Intermittent                       [facility] Adult PT Treatment/Exercise - 07/25/19 0852      Shoulder Exercises: Sidelying   External Rotation  20 reps    External Rotation Weight (lbs)  3    External Rotation Limitations  x2    ABduction  20 reps   AROM   ABduction Weight (lbs)  3    ABduction Limitations  x2      Shoulder Exercises: Standing   Internal Rotation  20 reps    Theraband Level (Shoulder Internal Rotation)  Level 2 (Red)    Internal Rotation Limitations  x2    Flexion Limitations  punch red band x 20    Extension  Strengthening;Both;20 reps    Theraband Level (Shoulder Extension)  Level 2 (Red)    Extension Limitations  x2    Row  Strengthening;Both;20 reps    Theraband Level (Shoulder Row)  Level 2 (Red)    Row Limitations  x2    Other Standing Exercises  standing extension isometric 10 x 10 sec hold  shld flex serratus activation with Resistance red t band x 10   made shoulder feel better   Other Standing Exercises  standing cane flexion and abduction/scaption      Modalities   Modalities  Iontophoresis      Iontophoresis   Type of Iontophoresis  Dexamethasone    Location  left ant shoulder    Dose  1cc    Time  4-6 hours  patch and then to be removed      Manual Therapy   Manual Therapy  Joint mobilization;Passive ROM    Joint Mobilization  A/P and inferior mobs grade II     Passive ROM  Flexion, Abduction, ER, IR                PT Short Term Goals - 07/25/19 0910      PT SHORT TERM GOAL #1   Title  Pt will be independent with initial HEP    Baseline  Pt given intial HEP    Time  2    Period  Weeks    Status  On-going    Target Date  07/29/19      PT SHORT TERM GOAL #2   Title  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    Baseline  no knowledge    Time  6    Period  Weeks    Status  New    Target Date  08/26/19      PT LONG TERM GOAL #2   Title  left shoulder IR  will return to [facility] [facility] to return to pain-free ADLs such as dressing  and grooming.    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    Baseline  eval 36 % limitation    Time  6    Period  Weeks    Status  New    Target Date  08/26/19      PT LONG TERM GOAL #5   Title  LT shoulder AROM scaption will improve to 0-155 degrees for improved overhead reaching.    Baseline  130 degrees eval    Time  6    Period  Weeks    Status  New    Target Date  08/26/19            Plan - 07/25/19 1006    Clinical Impression Statement  Pt reported he was a little better but reports worse 5/10.  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  He only has pain with elevating shoulder above 110 degrees.  Pt to continue HEP with  added resistance. Pt given iontophoresis over anterior shoulder.  Will continue progressing added weights in order to perform job duties    Personal Factors and Comorbidities  Age;Comorbidity 1;Profession    Comorbidities  DM, Hyperlipidemia    Examination-Activity Limitations  Carry;Dressing;Lift;Reach Overhead    Stability/Clinical Decision Making  Stable/Uncomplicated    Rehab Potential  Excellent    PT Frequency  2x / week    PT Duration  6 weeks    PT Treatment/Interventions  ADLs/Self Care Home Management;Cryotherapy;Electrical Stimulation;Ultrasound;Moist Heat;Iontophoresis 4mg /ml Dexamethasone;Therapeutic activities;Therapeutic exercise;Neuromuscular re-education;Manual techniques;Patient/family education;Dry needling;Passive range of motion;Taping;Joint Manipulations     PT Next Visit Plan  Manual, progress HEP for shoulder strengthening in order to lift 75# for job    PT Home Exercise Plan  bil rows, extension and ER, supine cane pullovers, standing cane flex and abdct isometric extension, serratus anterior    Consulted and Agree with Plan of Care  Patient       Patient will benefit from skilled therapeutic intervention in order to improve the following deficits and impairments:  Decreased range of motion, Decreased strength, Increased fascial restricitons, Increased muscle spasms, Postural dysfunction, Improper body mechanics, Impaired UE functional use, Pain  Visit Diagnosis: Chronic left shoulder pain  Stiffness of left shoulder, not elsewhere classified  Muscle weakness (generalized)     Problem List Patient Active Problem List   Diagnosis Date Noted  . Healthcare maintenance 08/27/2018  . Popliteal cyst, left 07/03/2017  . Diabetes ([place]) 06/04/2014  . HYPERLIPIDEMIA, MIXED 05/27/2007  . 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 Essential hypertension 05/17/2007   Voncille Lo, PT Certified Exercise Expert for the Aging Adult  07/25/19 10:11 AM Phone: [phone] Fax: [facility] [address] [place], [place], [address] Phone: [phone]   Fax:  [phone]  Name: Tyler Wolf MRN: [id number] Date of Birth: [birth year]

## 2019-07-29 ENCOUNTER — Encounter: Payer: Self-pay | Admitting: Physical Therapy

## 2019-07-29 ENCOUNTER — Ambulatory Visit: Payer: BC Managed Care – PPO | Admitting: Physical Therapy

## 2019-07-29 ENCOUNTER — Other Ambulatory Visit: Payer: Self-pay

## 2019-07-29 DIAGNOSIS — G8929 Other chronic pain: Secondary | ICD-10-CM | POA: Diagnosis not present

## 2019-07-29 DIAGNOSIS — M25612 Stiffness of left shoulder, not elsewhere classified: Secondary | ICD-10-CM

## 2019-07-29 DIAGNOSIS — M25512 Pain in left shoulder: Secondary | ICD-10-CM | POA: Diagnosis not present

## 2019-07-29 DIAGNOSIS — M6281 Muscle weakness (generalized): Secondary | ICD-10-CM

## 2019-07-29 NOTE — Therapy (Signed)
[place], [place], [address] Phone: [phone]   Fax:  [phone]  Physical Therapy Treatment  Patient Details  Name: Tyler Wolf MRN: [id number] Date of Birth: 1956/01/22 Referring Provider (PT): Mina Marble D NP   Encounter Date: 07/29/2019  PT End of Session - 07/29/19 0914    Visit Number  4    Number of Visits  13    Date for PT Re-Evaluation  08/26/19    Authorization Type  BCBS    PT Start Time  0715    PT Stop Time  0800    PT Time Calculation (min)  45 min       Past Medical History:  Diagnosis Date  . DIABETES MELLITUS, TYPE II 05/17/2007  . DIVERTICULITIS, ACUTE 08/17/2010  . 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 HYPERLIPIDEMIA, MIXED 05/27/2007  . HYPERTENSION 05/17/2007  . TOBACCO ABUSE 05/27/2007    History reviewed. No pertinent surgical history.  There were no vitals filed for this visit.  Subjective Assessment - 07/29/19 0718    Subjective  I dont think the pain is as bad as it was. It is a little better reaching over head.    Currently in Pain?  Yes    Pain Score  0-No pain    Aggravating Factors   catches with putting shirt on and reaching up    Pain Relieving Factors  avoid painful motions                       [facility] Adult PT Treatment/Exercise - 07/29/19 0001      Shoulder Exercises: Supine   Protraction  20 reps    Protraction Weight (lbs)  3    Flexion  10 reps    Shoulder Flexion Weight (lbs)  3    Flexion Limitations  bent elbow to extended       Shoulder Exercises: Sidelying   External Rotation  20 reps    External Rotation Weight (lbs)  3    External Rotation Limitations  x2    ABduction  20 reps   AROM   ABduction Weight (lbs)  3    ABduction Limitations  x2      Shoulder Exercises: Standing   External Rotation  20 reps    Theraband Level (Shoulder External Rotation)  Level 3 (Green)    External Rotation Limitations  x2     Internal Rotation  20 reps    Theraband Level (Shoulder Internal Rotation)  Level 3 (Green)    Internal Rotation Limitations  x2    Flexion Limitations  punch green band x 20    Extension  Strengthening;Both;20 reps    Theraband Level (Shoulder Extension)  Level 3 (Green)    Row  Strengthening;Both;20 reps    Theraband Level (Shoulder Row)  Level 3 (Green)    Other Standing Exercises  standing extension isometric 10 x 10 sec hold  shld flex serratus activation with Resistance red t band x 10   made shoulder feel better   Other Standing Exercises  standing wall  flexion and abduction/scaption, cabinet reaching middle shelf 2# x15 min pain , ball on wall flexion circles 30 sec each way        Shoulder Exercises: ROM/Strengthening   UBE (Upper Arm Bike)  L3.5 forward       Iontophoresis   Type of Iontophoresis  Dexamethasone    Location  left ant shoulder  Dose  1cc    Time  4-6 hours patch and then to be removed      Manual Therapy   Joint Mobilization  A/P and inferior mobs grade II     Passive ROM  Flexion, Abduction, ER, IR                PT Short Term Goals - 07/25/19 0910      PT SHORT TERM GOAL #1   Title  Pt will be independent with initial HEP    Baseline  Pt given intial HEP    Time  2    Period  Weeks    Status  On-going    Target Date  07/29/19      PT SHORT TERM GOAL #2   Title  Pt 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    Baseline  no knowledge    Time  6    Period  Weeks    Status  New    Target Date  08/26/19      PT LONG TERM GOAL #2   Title  left shoulder IR  will return to [facility] to return  to pain-free ADLs such as dressing and grooming.    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    Baseline  eval 36 % limitation    Time  6    Period  Weeks    Status  New    Target Date  08/26/19      PT LONG TERM GOAL #5   Title  LT shoulder AROM scaption will improve to 0-155 degrees for improved overhead reaching.    Baseline  130 degrees eval    Time  6    Period  Weeks    Status  New    Target Date  08/26/19            Plan - 07/29/19 0915    Clinical Impression Statement  Pt arrives reporting less pain. He still has difficulty reaching over head and donning shirts Continued strengthening and AAROM/PROM. Some increased pain with sidelying strengthening. Repeated ionto patch for pain and inflammation.    PT Next Visit Plan  Manual, progress  HEP for shoulder strengthening in order to lift 75# for job    PT Home Exercise Plan  bil rows, extension and ER, supine cane pullovers, standing cane flex and abdct isometric extension, serratus anterior       Patient will benefit from skilled therapeutic intervention in order to improve the following deficits and impairments:  Decreased range of motion, Decreased strength, Increased fascial restricitons, Increased muscle spasms, Postural dysfunction, Improper body mechanics, Impaired UE functional use, Pain  Visit Diagnosis: Chronic left shoulder pain  Stiffness of left shoulder, not elsewhere classified  Muscle weakness (generalized)     Problem List Patient Active Problem List    Diagnosis Date Noted  . Healthcare maintenance 08/27/2018  . Popliteal cyst, left 07/03/2017  . Diabetes ([place]) 06/04/2014  . HYPERLIPIDEMIA, MIXED 05/27/2007  . 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 Essential hypertension 05/17/2007    Dorene Ar, PTA 07/29/2019, 9:17 AM  [facility] [address] [place], [place], [address] Phone: [phone]   Fax:  [phone]  Name: NICKEY BECK MRN: [id number] Date of Birth: 1955-11-08

## 2019-07-31 ENCOUNTER — Other Ambulatory Visit: Payer: Self-pay

## 2019-07-31 ENCOUNTER — Encounter: Payer: Self-pay | Admitting: Physical Therapy

## 2019-07-31 ENCOUNTER — Ambulatory Visit: Payer: BC Managed Care – PPO | Admitting: Physical Therapy

## 2019-07-31 DIAGNOSIS — M6281 Muscle weakness (generalized): Secondary | ICD-10-CM | POA: Diagnosis not present

## 2019-07-31 DIAGNOSIS — M25612 Stiffness of left shoulder, not elsewhere classified: Secondary | ICD-10-CM

## 2019-07-31 DIAGNOSIS — G8929 Other chronic pain: Secondary | ICD-10-CM

## 2019-07-31 DIAGNOSIS — M25512 Pain in left shoulder: Secondary | ICD-10-CM | POA: Diagnosis not present

## 2019-07-31 NOTE — Therapy (Signed)
[place], [place], [address] Phone: [phone]   Fax:  [phone]  Physical Therapy Treatment  Patient Details  Name: Tyler Wolf MRN: [id number] Date of Birth: 02/27/1956 Referring Provider (PT): Mina Marble D NP   Encounter Date: 07/31/2019  PT End of Session - 07/31/19 0803    Visit Number  5    Number of Visits  13    Date for PT Re-Evaluation  08/26/19    Authorization Type  BCBS    PT Start Time  0800    PT Stop Time  0845    PT Time Calculation (min)  45 min       Past Medical History:  Diagnosis Date  . DIABETES MELLITUS, TYPE II 05/17/2007  . DIVERTICULITIS, ACUTE 08/17/2010  . 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 HYPERLIPIDEMIA, MIXED 05/27/2007  . HYPERTENSION 05/17/2007  . TOBACCO ABUSE 05/27/2007    History reviewed. No pertinent surgical history.  There were no vitals filed for this visit.  Subjective Assessment - 07/31/19 0802    Subjective  The shoulder is getting better.    Currently in Pain?  No/denies         [facility] PT Assessment - 07/31/19 0001      AROM   Left Shoulder Flexion  140 Degrees    Left Shoulder ABduction  150 Degrees    Left Shoulder Internal Rotation  --   reaching L1                  [facility] Adult PT Treatment/Exercise - 07/31/19 0001      Shoulder Exercises: Supine   Protraction  20 reps    Protraction Weight (lbs)  3    Flexion  10 reps    Shoulder Flexion Weight (lbs)  3    Flexion Limitations  bent elbow to extended       Shoulder Exercises: Sidelying   External Rotation  20 reps    External Rotation Weight (lbs)  3    External Rotation Limitations  x2    ABduction  20 reps   AROM   ABduction Weight (lbs)  3    ABduction Limitations  x 2      Shoulder Exercises: Standing   External Rotation  20 reps    Theraband Level (Shoulder External Rotation)  Level 3 (Green)    External Rotation Limitations  x2     Internal  Rotation  20 reps    Theraband Level (Shoulder Internal Rotation)  Level 3 (Green)    Internal Rotation Limitations  x2    Flexion Limitations  punch green band x 20    Extension  Strengthening;Both;20 reps    Theraband Level (Shoulder Extension)  Level 3 (Green)    Row  Strengthening;Both;20 reps    Theraband Level (Shoulder Row)  Level 3 (Green)    Other Standing Exercises  standing extension isometric 10 x 10 sec hold  shld flex serratus activation with Resistance red t band x 10   made shoulder feel better   Other Standing Exercises  standing wall  flexion and abduction/scaption, cabinet reaching middle shelf 2# x15 min pain , ball on wall flexion circles 30 sec each way        Shoulder Exercises: ROM/Strengthening   UBE (Upper Arm Bike)  L3 forward x 3 min, backward x 3 min      Manual Therapy   Joint Mobilization  A/P and inferior mobs grade II  Passive ROM  Flexion, Abduction, ER, IR                PT Short Term Goals - 07/25/19 0910      PT SHORT TERM GOAL #1   Title  Pt will be independent with initial HEP    Baseline  Pt given intial HEP    Time  2    Period  Weeks    Status  On-going    Target Date  07/29/19      PT SHORT TERM GOAL #2   Title  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    Baseline  no knowledge    Time  6    Period  Weeks    Status  New    Target Date  08/26/19      PT LONG TERM GOAL #2   Title  left shoulder IR  will return to [facility] to return to pain-free ADLs such as dressing and grooming.    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    Baseline  eval 36 % limitation    Time  6    Period  Weeks    Status  New    Target Date  08/26/19      PT LONG TERM GOAL #5   Title  LT shoulder AROM scaption will improve to 0-155 degrees for improved overhead reaching.    Baseline  130 degrees eval    Time  6    Period  Weeks    Status  New    Target Date  08/26/19            Plan - 07/31/19 0850    Clinical Impression Statement  Pt arrives reporting decreasesd pain. His AROM has improved. Continued with strengthening with pt reporting mild increased pain.    PT Next Visit Plan  Manual, progress HEP for shoulder strengthening in order to lift 75# for job    PT Home Exercise Plan  bil rows, extension and ER, supine cane pullovers, standing cane flex and abdct isometric extension, serratus anterior       Patient will benefit from skilled therapeutic intervention in order to  improve the following deficits and impairments:  Decreased range of motion, Decreased strength, Increased fascial restricitons, Increased muscle spasms, Postural dysfunction, Improper body mechanics, Impaired UE functional use, Pain  Visit Diagnosis: Chronic left shoulder pain  Stiffness of left shoulder, not elsewhere classified  Muscle weakness (generalized)     Problem List Patient Active Problem List   Diagnosis Date Noted  . Healthcare maintenance 08/27/2018  . Popliteal cyst, left 07/03/2017  . Diabetes ([place]) 06/04/2014  . HYPERLIPIDEMIA, MIXED 05/27/2007  . 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 Essential hypertension  05/17/2007    Dorene Ar, PTA 07/31/2019, 8:52 AM  [facility] [address] [place], [place], [address] Phone: [phone]   Fax:  [phone]  Name: HASANI MENDELSON MRN: [id number] Date of Birth: 01-17-1956

## 2019-08-05 ENCOUNTER — Ambulatory Visit: Payer: BC Managed Care – PPO | Admitting: Physical Therapy

## 2019-08-05 ENCOUNTER — Encounter: Payer: Self-pay | Admitting: Physical Therapy

## 2019-08-05 ENCOUNTER — Other Ambulatory Visit: Payer: Self-pay

## 2019-08-05 DIAGNOSIS — M25612 Stiffness of left shoulder, not elsewhere classified: Secondary | ICD-10-CM

## 2019-08-05 DIAGNOSIS — G8929 Other chronic pain: Secondary | ICD-10-CM

## 2019-08-05 DIAGNOSIS — M25512 Pain in left shoulder: Secondary | ICD-10-CM

## 2019-08-05 DIAGNOSIS — M6281 Muscle weakness (generalized): Secondary | ICD-10-CM | POA: Diagnosis not present

## 2019-08-05 NOTE — Therapy (Signed)
[place], [place], [address] Phone: [phone]   Fax:  [phone]  Physical Therapy Treatment  Patient Details  Name: Tyler Wolf MRN: [id number] Date of Birth: 1955-12-28 Referring Provider (PT): Mina Marble D NP   Encounter Date: 08/05/2019  PT End of Session - 08/05/19 0719    Visit Number  6    Number of Visits  13    Date for PT Re-Evaluation  08/26/19    Authorization Type  BCBS    PT Start Time  [id number]    PT Stop Time  0800    PT Time Calculation (min)  43 min       Past Medical History:  Diagnosis Date  . DIABETES MELLITUS, TYPE II 05/17/2007  . DIVERTICULITIS, ACUTE 08/17/2010  . 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 HYPERLIPIDEMIA, MIXED 05/27/2007  . HYPERTENSION 05/17/2007  . TOBACCO ABUSE 05/27/2007    History reviewed. No pertinent surgical history.  There were no vitals filed for this visit.  Subjective Assessment - 08/05/19 0718    Subjective  I think the pain is decreasing in the shoulder little by little.    Currently in Pain?  Yes    Pain Score  2     Pain Location  Shoulder    Pain Orientation  Left    Pain Descriptors / Indicators  Aching;Dull    Aggravating Factors   catches with pulling shirt on and reaching up    Pain Relieving Factors  avoid painful motions         [facility] PT Assessment - 08/05/19 0001      AROM   Left Shoulder Internal Rotation  --   reaching L1 with discomfort                  [facility] Adult PT Treatment/Exercise - 08/05/19 0001      Shoulder Exercises: Supine   Protraction  20 reps    Protraction Weight (lbs)  5    Flexion  20 reps    Shoulder Flexion Weight (lbs)  3    Flexion Limitations  bent elbow to extended     Other Supine Exercises  supine flexion circles 3#       Shoulder Exercises: Sidelying   External Rotation  20 reps    External Rotation Weight (lbs)  3    External Rotation Limitations  x2    ABduction   20 reps   AROM   ABduction Weight (lbs)  3    ABduction Limitations  x2      Shoulder Exercises: Standing   External Rotation  20 reps    Theraband Level (Shoulder External Rotation)  Level 3 (Green)    External Rotation Limitations  x2     Internal Rotation  20 reps    Theraband Level (Shoulder Internal Rotation)  Level 3 (Green)    Internal Rotation Limitations  x2    Flexion Limitations  punch green band x 20    Extension  Strengthening;Both;20 reps    Theraband Level (Shoulder Extension)  Level 3 (Green)    Row  Strengthening;Both;20 reps    Theraband Level (Shoulder Row)  Level 3 (Green)    Other Standing Exercises  standing extension isometric 10 x 10 sec hold  shld flex serratus activation with Resistance red t band x 10   made shoulder feel better   Other Standing Exercises  standing wall  flexion and abduction/scaption, cabinet reaching middle shelf  3# x15 min pain , ball on wall flexion circles 30 sec each way  , over head press 3# 10 x 2       Shoulder Exercises: ROM/Strengthening   UBE (Upper Arm Bike)  L3 forward x 3 min, backward x 3 min      Iontophoresis   Type of Iontophoresis  Dexamethasone    Location  left ant shoulder    Dose  1cc    Time  4-6 hours patch and then to be removed      Manual Therapy   Joint Mobilization  A/P and inferior mobs grade II     Passive ROM  Flexion, Abduction, ER, IR                PT Short Term Goals - 07/25/19 0910      PT SHORT TERM GOAL #1   Title  Pt will be independent with initial HEP    Baseline  Pt given intial HEP    Time  2    Period  Weeks    Status  On-going    Target Date  07/29/19      PT SHORT TERM GOAL #2   Title  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    Baseline  no knowledge    Time  6    Period  Weeks    Status  New    Target Date  08/26/19      PT LONG TERM GOAL #2   Title  left shoulder IR  will return to [facility] to return to pain-free ADLs such as dressing and grooming.    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    Baseline  eval 36 % limitation    Time  6    Period  Weeks    Status  New    Target Date  08/26/19      PT LONG TERM GOAL #5   Title  LT shoulder AROM scaption will improve to 0-155 degrees for improved overhead reaching.    Baseline  130 degrees eval    Time  6    Period  Weeks    Status  New    Target Date  08/26/19            Plan - 08/05/19 0819    Clinical Impression Statement  Pt reports slow improvement in pain. He still avoid reaching up and using LUE to don shirt. Countinued with functional strength and began overhead press with mild increased pain. Some pain with end range over head stretches. Ionto repeated for pain and inflammation.    PT Next Visit Plan  Manual, progress HEP for shoulder strengthening in order to lift 75# for job    PT Home Exercise Plan  bil rows, extension and ER, supine cane pullovers, standing cane flex and abdct isometric extension, serratus anterior       Patient will benefit from skilled therapeutic intervention in order to improve the following deficits and impairments:  Decreased range of motion,  Decreased strength, Increased fascial restricitons, Increased muscle spasms, Postural dysfunction, Improper body mechanics, Impaired UE functional use, Pain  Visit Diagnosis: Chronic left shoulder pain  Stiffness of left shoulder, not elsewhere classified  Muscle weakness (generalized)     Problem List Patient Active Problem List   Diagnosis Date Noted  . Healthcare maintenance 08/27/2018  . Popliteal cyst, left 07/03/2017  . Diabetes ([place]) 06/04/2014  . HYPERLIPIDEMIA, MIXED 05/27/2007  . 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 Essential hypertension 05/17/2007    Dorene Ar, PTA 08/05/2019, 8:21 AM  [facility] [address] [place], [place], [address] Phone: [phone]   Fax:  [phone]  Name: EKENE BARRINGTON MRN: [id number] Date of Birth: 04/10/1956

## 2019-08-08 ENCOUNTER — Encounter: Payer: Self-pay | Admitting: Physical Therapy

## 2019-08-08 ENCOUNTER — Other Ambulatory Visit: Payer: Self-pay

## 2019-08-08 ENCOUNTER — Ambulatory Visit: Payer: BC Managed Care – PPO | Admitting: Physical Therapy

## 2019-08-08 DIAGNOSIS — M25612 Stiffness of left shoulder, not elsewhere classified: Secondary | ICD-10-CM | POA: Diagnosis not present

## 2019-08-08 DIAGNOSIS — M25512 Pain in left shoulder: Secondary | ICD-10-CM | POA: Diagnosis not present

## 2019-08-08 DIAGNOSIS — G8929 Other chronic pain: Secondary | ICD-10-CM | POA: Diagnosis not present

## 2019-08-08 DIAGNOSIS — M6281 Muscle weakness (generalized): Secondary | ICD-10-CM | POA: Diagnosis not present

## 2019-08-08 NOTE — Therapy (Signed)
[place], [place], [address] Phone: [phone]   Fax:  [phone]  Physical Therapy Treatment  Patient Details  Name: Tyler Wolf MRN: [id number] Date of Birth: December 15, 1955 Referring Provider (PT): Mina Marble D NP   Encounter Date: 08/08/2019  PT End of Session - 08/08/19 0723    Visit Number  7    Number of Visits  13    Date for PT Re-Evaluation  08/26/19    Authorization Type  BCBS    PT Start Time  [phone]    PT Stop Time  0800    PT Time Calculation (min)  44 min       Past Medical History:  Diagnosis Date  . DIABETES MELLITUS, TYPE II 05/17/2007  . DIVERTICULITIS, ACUTE 08/17/2010  . 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 HYPERLIPIDEMIA, MIXED 05/27/2007  . HYPERTENSION 05/17/2007  . TOBACCO ABUSE 05/27/2007    History reviewed. No pertinent surgical history.  There were no vitals filed for this visit.  Subjective Assessment - 08/08/19 0722    Subjective  I am better this week. Pain woke me up last night.    Currently in Pain?  Yes    Pain Score  1     Pain Location  Shoulder    Pain Orientation  Left    Pain Descriptors / Indicators  Aching;Dull    Pain Type  Chronic pain                       [facility] Adult PT Treatment/Exercise - 08/08/19 0001      Shoulder Exercises: Standing   External Rotation  20 reps    Theraband Level (Shoulder External Rotation)  Level 3 (Green)    External Rotation Limitations  x2     Internal Rotation  20 reps    Theraband Level (Shoulder Internal Rotation)  Level 3 (Green)    Internal Rotation Limitations  x2    Flexion  20 reps   forward raise   Shoulder Flexion Weight (lbs)  2    Flexion Limitations  punch green band x 20    ABduction  10 reps   lateral raise    Shoulder ABduction Weight (lbs)  2    Extension  Strengthening;Both;20 reps    Theraband Level (Shoulder Extension)  Level 3 (Green)    Row  Strengthening;Both;20  reps    Theraband Level (Shoulder Row)  Level 3 (Green)    Other Standing Exercises  standing extension isometric 10 x 10 sec hold  shld flex serratus activation with Resistance red t band x 10    Other Standing Exercises  standing wall  flexion and abduction/scaption, cabinet reaching middle shelf 3# x15 min pain , ball on wall flexion circles 30 sec each way  , over head press 3# 10 x 2       Shoulder Exercises: ROM/Strengthening   UBE (Upper Arm Bike)  L3 forward x 3 min, backward x 3 min      Shoulder Exercises: Stretch   Corner Stretch  3 reps;30 seconds    Internal Rotation Stretch  2 reps    Internal Rotation Stretch Limitations  30 sec towel       Iontophoresis   Type of Iontophoresis  Dexamethasone    Location  left ant shoulder    Dose  1cc    Time  4-6 hours patch and then to be removed  Manual Therapy   Joint Mobilization  A/P and inferior mobs grade II     Passive ROM  Flexion, Abduction, ER, IR                PT Short Term Goals - 07/25/19 0910      PT SHORT TERM GOAL #1   Title  Pt will be independent with initial HEP    Baseline  Pt given intial HEP    Time  2    Period  Weeks    Status  On-going    Target Date  07/29/19      PT SHORT TERM GOAL #2   Title  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    Baseline  no knowledge    Time  6    Period  Weeks    Status  New    Target Date  08/26/19      PT LONG TERM GOAL #2   Title  left shoulder IR  will return to [facility] to return to pain-free ADLs such as dressing and grooming.    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    Baseline  eval 36 % limitation    Time  6    Period  Weeks    Status  New    Target Date  08/26/19      PT LONG TERM GOAL #5   Title  LT shoulder AROM scaption will improve to 0-155 degrees for improved overhead reaching.    Baseline  130 degrees eval    Time  6    Period  Weeks    Status  New    Target Date  08/26/19            Plan - 08/08/19 0813    Clinical Impression Statement  Progressed resistance with strengthening in standing. Most difficulty with lateral raises. Needs cues to avoid compensation.    PT Next Visit Plan  Manual, progress HEP for shoulder strengthening in order to lift 75# for job    PT Home Exercise Plan  bil rows, extension and ER, supine cane pullovers, standing cane flex and abdct isometric extension, serratus anterior  Patient will benefit from skilled therapeutic intervention in order to improve the following deficits and impairments:  Decreased range of motion, Decreased strength, Increased fascial restricitons, Increased muscle spasms, Postural dysfunction, Improper body mechanics, Impaired UE functional use, Pain  Visit Diagnosis: No diagnosis found.     Problem List Patient Active Problem List   Diagnosis Date Noted  . Healthcare maintenance 08/27/2018  . Popliteal cyst, left 07/03/2017  . Diabetes ([place]) 06/04/2014  . HYPERLIPIDEMIA, MIXED 05/27/2007  . 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 Essential hypertension 05/17/2007    Dorene Ar, PTA 08/08/2019, 8:15 AM  [facility] [address] [place], [place], [address] Phone: [phone]   Fax:  [phone]  Name: Tyler Wolf MRN: [id number] Date of Birth: 1956-01-08

## 2019-08-12 ENCOUNTER — Other Ambulatory Visit: Payer: Self-pay

## 2019-08-12 ENCOUNTER — Encounter: Payer: Self-pay | Admitting: Physical Therapy

## 2019-08-12 ENCOUNTER — Ambulatory Visit: Payer: BC Managed Care – PPO | Admitting: Physical Therapy

## 2019-08-12 DIAGNOSIS — M6281 Muscle weakness (generalized): Secondary | ICD-10-CM | POA: Diagnosis not present

## 2019-08-12 DIAGNOSIS — M25612 Stiffness of left shoulder, not elsewhere classified: Secondary | ICD-10-CM | POA: Diagnosis not present

## 2019-08-12 DIAGNOSIS — M25512 Pain in left shoulder: Secondary | ICD-10-CM

## 2019-08-12 DIAGNOSIS — G8929 Other chronic pain: Secondary | ICD-10-CM

## 2019-08-12 NOTE — Therapy (Signed)
[place], [place], [address] Phone: [phone]   Fax:  [phone]  Physical Therapy Treatment  Patient Details  Name: Tyler Wolf MRN: [id number] Date of Birth: 12/30/1955 Referring Provider (PT): Mina Marble D NP   Encounter Date: 08/12/2019  PT End of Session - 08/12/19 0718    Visit Number  8    Number of Visits  13    Date for PT Re-Evaluation  08/26/19    Authorization Type  BCBS    PT Start Time  0716    PT Stop Time  0757    PT Time Calculation (min)  41 min       Past Medical History:  Diagnosis Date  . DIABETES MELLITUS, TYPE II 05/17/2007  . DIVERTICULITIS, ACUTE 08/17/2010  . 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 HYPERLIPIDEMIA, MIXED 05/27/2007  . HYPERTENSION 05/17/2007  . TOBACCO ABUSE 05/27/2007    History reviewed. No pertinent surgical history.  There were no vitals filed for this visit.  Subjective Assessment - 08/12/19 0718    Subjective  It's a little better.                       [facility] Adult PT Treatment/Exercise - 08/12/19 0001      Shoulder Exercises: Supine   Other Supine Exercises  green band supine scap stab series       Shoulder Exercises: Standing   Horizontal ABduction  20 reps    Theraband Level (Shoulder Horizontal ABduction)  Level 2 (Red)    External Rotation  20 reps    Theraband Level (Shoulder External Rotation)  Level 3 (Green)    External Rotation Limitations  x2     Internal Rotation  20 reps    Theraband Level (Shoulder Internal Rotation)  Level 3 (Green)    Internal Rotation Limitations  x2    Flexion  20 reps   forward raise   Shoulder Flexion Weight (lbs)  2    Flexion Limitations  punch green band x 20    ABduction  10 reps   lateral raise    Shoulder ABduction Weight (lbs)  2    Extension  Strengthening;Both;20 reps    Theraband Level (Shoulder Extension)  Level 3 (Green)    Row  Strengthening;Both;20 reps    Theraband Level (Shoulder Row)  Level 3 (Green)    Other Standing Exercises  standing extension isometric 10 x 10 sec hold  shld flex serratus activation with Resistance red t band x 20, lateral wall waks yellow band x 10 ,     Other Standing Exercises  standing wall  flexion and abduction/scaption, cabinet reaching middle shelf 3# x15 min pain , ball on wall flexion circles 30 sec each way  , over head press 3# 10 x 2 , abduction wall circles 30 sec       Shoulder Exercises: ROM/Strengthening   UBE (Upper Arm Bike)  L3 forward x 3 min, backward x 3 min             PT Education - 08/12/19 0754    Education Details  HEP    Person(s) Educated  Patient    Methods  Explanation;Handout    Comprehension  Verbalized understanding       PT Short Term Goals - 07/25/19 0910      PT SHORT TERM GOAL #1   Title  Pt will be independent with initial HEP  Baseline  Pt given intial HEP    Time  2    Period  Weeks    Status  On-going    Target Date  07/29/19      PT SHORT TERM GOAL #2   Title  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    Baseline  no knowledge    Time  6    Period  Weeks    Status  New    Target Date  08/26/19      PT LONG TERM GOAL #2   Title  left shoulder IR  will return to [facility] to return to pain-free ADLs such as dressing and grooming.    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    Baseline  eval 36 % limitation    Time  6    Period  Weeks    Status  New    Target Date  08/26/19      PT LONG TERM GOAL #5   Title  LT shoulder AROM scaption will improve to 0-155 degrees for improved overhead reaching.    Baseline  130 degrees eval    Time  6    Period  Weeks    Status  New    Target Date  08/26/19            Plan - 08/12/19 0757    Clinical Impression Statement  Progressed scapular stability today and updated HEP. He reports consistent improvement in pain.    PT Next Visit Plan  Manual, progress HEP for shoulder strengthening in order to lift 75# for job    PT Home Exercise Plan  bil rows, extension and ER, supine cane pullovers, standing cane flex and abdct isometric extension, serratus anterior, supine scap stab series green, standing scaption yellow       Patient will benefit from skilled therapeutic intervention in order to improve the following deficits and impairments:  Decreased range of motion, Decreased strength, Increased fascial restricitons, Increased muscle spasms, Postural dysfunction, Improper body mechanics, Impaired UE functional use, Pain  Visit Diagnosis: Chronic left shoulder pain  Stiffness of left shoulder, not elsewhere classified  Muscle weakness (generalized)  Problem List Patient Active Problem List   Diagnosis Date Noted  . Healthcare maintenance 08/27/2018  . Popliteal cyst, left 07/03/2017  . Diabetes ([place]) 06/04/2014  . HYPERLIPIDEMIA, MIXED 05/27/2007  . 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 Essential hypertension 05/17/2007    Dorene Ar, PTA 08/12/2019, 7:59 AM  [facility] [address] [place], [place],  [address] Phone: [phone]   Fax:  [phone]  Name: MATS HEASTER MRN: [id number] Date of Birth: 06/09/1956

## 2019-08-12 NOTE — Patient Instructions (Signed)
Over Head Pull: Narrow Grip       On back, knees bent, feet flat, band across thighs, elbows straight but relaxed. Pull hands apart (start). Keeping elbows straight, bring arms up and over head, hands toward floor. Keep pull steady on band. Hold momentarily. Return slowly, keeping pull steady, back to start. Repeat _20__ times. Band color _G_____   Side Pull: Double Arm   On back, knees bent, feet flat. Arms perpendicular to body, shoulder level, elbows straight but relaxed. Pull arms out to sides, elbows straight. Resistance band comes across collarbones, hands toward floor. Hold momentarily. Slowly return to starting position. Repeat __20_ times. Band color __G___   Sash   On back, knees bent, feet flat, left hand on left hip, right hand above left. Pull right arm DIAGONALLY (hip to shoulder) across chest. Bring right arm along head toward floor. Hold momentarily. Slowly return to starting position. Repeat _20__ times. Do with left arm. Band color __G____   Shoulder Rotation: Double Arm   On back, knees bent, feet flat, elbows tucked at sides, bent 90, hands palms up. Pull hands apart and down toward floor, keeping elbows near sides. Hold momentarily. Slowly return to starting position. Repeat __20_ times. Band color ___G___

## 2019-08-14 ENCOUNTER — Other Ambulatory Visit: Payer: Self-pay

## 2019-08-14 ENCOUNTER — Encounter: Payer: Self-pay | Admitting: Physical Therapy

## 2019-08-14 ENCOUNTER — Ambulatory Visit: Payer: BC Managed Care – PPO | Admitting: Physical Therapy

## 2019-08-14 DIAGNOSIS — G8929 Other chronic pain: Secondary | ICD-10-CM | POA: Diagnosis not present

## 2019-08-14 DIAGNOSIS — M6281 Muscle weakness (generalized): Secondary | ICD-10-CM

## 2019-08-14 DIAGNOSIS — M25612 Stiffness of left shoulder, not elsewhere classified: Secondary | ICD-10-CM | POA: Diagnosis not present

## 2019-08-14 DIAGNOSIS — M25512 Pain in left shoulder: Secondary | ICD-10-CM | POA: Diagnosis not present

## 2019-08-14 NOTE — Therapy (Signed)
[place], [place], [address] Phone: [phone]   Fax:  [phone]  Physical Therapy Treatment  Patient Details  Name: TREVONE PRESTWOOD MRN: [id number] Date of Birth: Oct 12, 1956 Referring Provider (PT): Mina Marble D NP   Encounter Date: 08/14/2019  PT End of Session - 08/14/19 0721    Visit Number  9    Number of Visits  13    Date for PT Re-Evaluation  08/26/19    Authorization Type  BCBS    PT Start Time  0715    PT Stop Time  0758    PT Time Calculation (min)  43 min       Past Medical History:  Diagnosis Date  . DIABETES MELLITUS, TYPE II 05/17/2007  . DIVERTICULITIS, ACUTE 08/17/2010  . 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 HYPERLIPIDEMIA, MIXED 05/27/2007  . HYPERTENSION 05/17/2007  . TOBACCO ABUSE 05/27/2007    History reviewed. No pertinent surgical history.  There were no vitals filed for this visit.  Subjective Assessment - 08/14/19 0717    Subjective  Shoulder was not too sore after last session. Less pain with the usual painful motions like reaching and donning shirt.    Currently in Pain?  No/denies         [facility] PT Assessment - 08/14/19 0001      AROM   Left Shoulder Flexion  160 Degrees    Left Shoulder ABduction  160 Degrees    Left Shoulder Internal Rotation  --   reaching to T10   Left Shoulder External Rotation  --   reach to T2      Strength   Left Shoulder Flexion  4+/5    Left Shoulder ABduction  4+/5   min pain    Left Shoulder Internal Rotation  4+/5   min pain   Left Shoulder External Rotation  4+/5                   [facility] Adult PT Treatment/Exercise - 08/14/19 0001      Shoulder Exercises: Supine   Horizontal ABduction  20 reps    Theraband Level (Shoulder Horizontal ABduction)  Level 3 (Green)    Diagonals  15 reps    Diagonals Limitations  yellow    Other Supine Exercises  narrow tension pullovers green band       Shoulder  Exercises: Sidelying   External Rotation  10 reps;20 reps    External Rotation Weight (lbs)  3   one set with 4#   ABduction  20 reps    ABduction Weight (lbs)  3      Shoulder Exercises: Standing   Horizontal ABduction  20 reps    Theraband Level (Shoulder Horizontal ABduction)  Level 3 (Green)    External Rotation  20 reps    Theraband Level (Shoulder External Rotation)  Level 4 (Blue)    Internal Rotation  20 reps    Theraband Level (Shoulder Internal Rotation)  Level 4 (Blue)    Flexion  20 reps   forward raise   Shoulder Flexion Weight (lbs)  2    ABduction  10 reps   lateral raise    Shoulder ABduction Weight (lbs)  2    ABduction Limitations  scaption    Extension  Strengthening;Both;20 reps    Theraband Level (Shoulder Extension)  Level 4 (Blue)    Row  Strengthening;Both;20 reps    Theraband Level (Shoulder Row)  Level 4 (Blue)  Other Standing Exercises  wall push up x 10, counter push up x 10, cues for technique and alignment     Other Standing Exercises  over head press 4# 10 x 2, lateral wall waks green band 10 x 2 then diagonal left only x 15      Shoulder Exercises: ROM/Strengthening   UBE (Upper Arm Bike)  L3 forward x 3 min, backward x 3 min      Shoulder Exercises: Stretch   Corner Stretch  3 reps;30 seconds    Internal Rotation Stretch  2 reps    Internal Rotation Stretch Limitations  30 sec towel                PT Short Term Goals - 08/14/19 0800      PT SHORT TERM GOAL #1   Title  Pt will be independent with initial HEP    Time  2    Period  Weeks    Status  Achieved      PT SHORT TERM GOAL #2   Title  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    Time  6     Period  Weeks    Status  On-going      PT LONG TERM GOAL #2   Title  left shoulder IR  will return to [facility] to return to pain-free ADLs such as dressing and grooming.    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    Baseline  eval 36 % limitation    Time  6    Period  Weeks    Status  Unable to assess      PT LONG TERM GOAL #5   Title  LT shoulder AROM scaption will improve to 0-155 degrees for improved overhead reaching.    Baseline  160    Time  6    Period  Weeks    Status  Achieved            Plan - 08/14/19 0719    Clinical Impression Statement  Pt notes less pain with normally painful motions. He has not had a chance to try new HEP exercises from last session, 2 days ago. Continued with review, shoulder strength , scap stability and functional reaching. Patient is independent with initial HEP. STG#1 met.  He now  has WFL standing AROM of left shoulder. LTG#2 partially met, #5 met.    PT Next Visit Plan  Manual, progress HEP for shoulder strengthening in order to lift 75# for job; check FOTO, begin lifting    PT Home Exercise Plan  bil rows, extension and ER, supine cane pullovers, standing cane flex and abdct isometric extension, serratus anterior, supine scap stab series green, standing scaption yellow       Patient will benefit from skilled therapeutic intervention in order to improve the following deficits and impairments:  Decreased range of motion, Decreased strength, Increased fascial restricitons, Increased muscle  spasms, Postural dysfunction, Improper body mechanics, Impaired UE functional use, Pain  Visit Diagnosis: Chronic left shoulder pain  Stiffness of left shoulder, not elsewhere classified  Muscle weakness (generalized)     Problem List Patient Active Problem List   Diagnosis Date Noted  . Healthcare maintenance 08/27/2018  . Popliteal cyst, left 07/03/2017  . Diabetes ([place]) 06/04/2014  . HYPERLIPIDEMIA, MIXED 05/27/2007  . 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 Essential hypertension 05/17/2007    Dorene Ar, PTA 08/14/2019, 8:19 AM  [facility] [address] [place], [place], [address] Phone: [phone]   Fax:  [phone]  Name: COSIMO SCHERTZER MRN: [id number] Date of Birth: August 21, 1956

## 2019-08-19 ENCOUNTER — Other Ambulatory Visit: Payer: Self-pay

## 2019-08-19 ENCOUNTER — Ambulatory Visit: Payer: BC Managed Care – PPO | Attending: Adult Health | Admitting: Physical Therapy

## 2019-08-19 ENCOUNTER — Encounter: Payer: Self-pay | Admitting: Physical Therapy

## 2019-08-19 DIAGNOSIS — M25612 Stiffness of left shoulder, not elsewhere classified: Secondary | ICD-10-CM | POA: Insufficient documentation

## 2019-08-19 DIAGNOSIS — G8929 Other chronic pain: Secondary | ICD-10-CM | POA: Diagnosis not present

## 2019-08-19 DIAGNOSIS — M6281 Muscle weakness (generalized): Secondary | ICD-10-CM | POA: Insufficient documentation

## 2019-08-19 DIAGNOSIS — M25512 Pain in left shoulder: Secondary | ICD-10-CM | POA: Diagnosis not present

## 2019-08-19 NOTE — Therapy (Signed)
[place], [place], [address] Phone: [phone]   Fax:  [phone]  Physical Therapy Treatment  Patient Details  Name: Tyler Wolf MRN: [id number] Date of Birth: 30-May-1956 Referring Provider (PT): Mina Marble D NP   Encounter Date: 08/19/2019  PT End of Session - 08/19/19 0721    Visit Number  10    Number of Visits  13    Date for PT Re-Evaluation  08/26/19    Authorization Type  BCBS    PT Start Time  0716    PT Stop Time  0756    PT Time Calculation (min)  40 min       Past Medical History:  Diagnosis Date  . DIABETES MELLITUS, TYPE II 05/17/2007  . DIVERTICULITIS, ACUTE 08/17/2010  . 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 HYPERLIPIDEMIA, MIXED 05/27/2007  . HYPERTENSION 05/17/2007  . TOBACCO ABUSE 05/27/2007    History reviewed. No pertinent surgical history.  There were no vitals filed for this visit.  Subjective Assessment - 08/19/19 0720    Currently in Pain?  No/denies         [facility] PT Assessment - 08/19/19 0001      Observation/Other Assessments   Focus on Therapeutic Outcomes (FOTO)   33% limited improved from 36% limited                    [facility] Adult PT Treatment/Exercise - 08/19/19 0001      Shoulder Exercises: Supine   Horizontal ABduction  20 reps    Theraband Level (Shoulder Horizontal ABduction)  Level 3 (Green)    Diagonals  20 reps    Diagonals Limitations  red    Other Supine Exercises  narrow tension pullovers green band       Shoulder Exercises: Sidelying   External Rotation  10 reps   2 sets   External Rotation Weight (lbs)  4    ABduction  20 reps    ABduction Weight (lbs)  3      Shoulder Exercises: Standing   Horizontal ABduction  20 reps    Theraband Level (Shoulder Horizontal ABduction)  Level 3 (Green)    External Rotation  20 reps    Theraband Level (Shoulder External Rotation)  Level 4 (Blue)    Internal Rotation  20 reps    Theraband Level (Shoulder Internal Rotation)  Level 4 (Blue)    Extension  Strengthening;Both;20 reps    Theraband Level (Shoulder Extension)  Level 4 (Blue)    Row  Strengthening;Both;20 reps    Theraband Level (Shoulder Row)  Level 4 (Blue)    Other Standing Exercises  lat pull 35# and row 35# , chest press 20#     Other Standing Exercises  10# bilat overhead press, 15# bilat KB overhead press x 10, squat with 10# KB chest press x10 , 25# KB squat to 8 inch step       Shoulder Exercises: ROM/Strengthening   UBE (Upper Arm Bike)  L3 forward x 3 min, backward x 3 min               PT Short Term Goals - 08/19/19 6438      PT SHORT TERM GOAL #1   Title  Pt will be independent with initial HEP    Baseline  Pt given intial HEP    Time  2    Period  Weeks    Status  Achieved  PT SHORT TERM GOAL #2   Title  Pt will be able to 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    Time  6    Period  Weeks    Status  On-going      PT LONG TERM GOAL #2   Title  left shoulder IR  will return to [facility] to return to pain-free ADLs such as dressing and grooming.    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    Baseline  eval 36 % limitation    Time  6    Period   Weeks    Status  Unable to assess      PT LONG TERM GOAL #5   Title  LT shoulder AROM scaption will improve to 0-155 degrees for improved overhead reaching.    Baseline  160    Time  6    Period  Weeks    Status  Achieved            Plan - 08/19/19 0801    Clinical Impression Statement  Continued with progressive strengthening utilizing Tarri Glenn and gym equipment. He was able to lift 15# overhead x 10. STG#2 met.    PT Next Visit Plan  Manual, progress HEP for shoulder strengthening in order to lift 75# for job , continue lifting, add PNF patterns standing    PT Home Exercise Plan  bil rows, extension and ER, supine cane pullovers, standing cane flex and abdct isometric extension, serratus anterior, supine scap stab series green, standing scaption yellow       Patient will benefit from skilled therapeutic intervention in order to improve the following deficits and impairments:  Decreased range of motion, Decreased strength, Increased fascial restricitons, Increased muscle spasms, Postural dysfunction, Improper body mechanics, Impaired UE functional use, Pain  Visit Diagnosis: Chronic left shoulder pain  Stiffness of left shoulder, not elsewhere classified  Muscle weakness (generalized)     Problem List Patient Active Problem List   Diagnosis Date Noted  . Healthcare maintenance 08/27/2018  . Popliteal cyst, left 07/03/2017  . Diabetes ([place]) 06/04/2014  . HYPERLIPIDEMIA, MIXED 05/27/2007  . 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 Essential hypertension 05/17/2007    Dorene Ar, PTA 08/19/2019, 8:03 AM  [place]  [place], [place], [address] Phone: [phone]   Fax:  [phone]  Name: Tyler Wolf MRN: [id number] Date of Birth: [birth year]

## 2019-08-21 ENCOUNTER — Encounter: Payer: Self-pay | Admitting: Physical Therapy

## 2019-08-21 ENCOUNTER — Ambulatory Visit: Payer: BC Managed Care – PPO | Admitting: Physical Therapy

## 2019-08-21 ENCOUNTER — Other Ambulatory Visit: Payer: Self-pay

## 2019-08-21 DIAGNOSIS — M25612 Stiffness of left shoulder, not elsewhere classified: Secondary | ICD-10-CM

## 2019-08-21 DIAGNOSIS — M6281 Muscle weakness (generalized): Secondary | ICD-10-CM

## 2019-08-21 DIAGNOSIS — G8929 Other chronic pain: Secondary | ICD-10-CM | POA: Diagnosis not present

## 2019-08-21 DIAGNOSIS — M25512 Pain in left shoulder: Secondary | ICD-10-CM

## 2019-08-21 NOTE — Therapy (Signed)
[place], [place], [address] Phone: [phone]   Fax:  [phone]  Physical Therapy Treatment  Patient Details  Name: Tyler Wolf MRN: [id number] Date of Birth: [birth year] Referring Provider (PT): Mina Marble D NP   Encounter Date: 08/21/2019  PT End of Session - 08/21/19 0717    Visit Number  11    Number of Visits  13    Date for PT Re-Evaluation  08/26/19    Authorization Type  BCBS    PT Start Time  0715    PT Stop Time  0753    PT Time Calculation (min)  38 min       Past Medical History:  Diagnosis Date  . DIABETES MELLITUS, TYPE II 05/17/2007  . DIVERTICULITIS, ACUTE 08/17/2010  . 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 HYPERLIPIDEMIA, MIXED 05/27/2007  . HYPERTENSION 05/17/2007  . TOBACCO ABUSE 05/27/2007    History reviewed. No pertinent surgical history.  There were no vitals filed for this visit.  Subjective Assessment - 08/21/19 0715    Subjective  The shoulder is starting to feel pretty good. No soreness after last session.    Currently in Pain?  No/denies    Aggravating Factors   min pain with reaching and internal rotation intermittently    Pain Relieving Factors  avoid painful motions                       [facility] Adult PT Treatment/Exercise - 08/21/19 0001      Shoulder Exercises: Supine   Diagonals  20 reps    Diagonals Limitations  red    Other Supine Exercises  pulloevers 5 # on dowel , 3# long arc flexion x 20       Shoulder Exercises: Prone   Horizontal ABduction 1  20 reps    Horizontal ABduction 1 Weight (lbs)  3      Shoulder Exercises: Sidelying   External Rotation  10 reps   2 sets   External Rotation Weight (lbs)  4    ABduction  20 reps    ABduction Weight (lbs)  4      Shoulder Exercises: Standing   External Rotation  20 reps    Theraband Level (Shoulder External Rotation)  Level 4 (Blue)    Internal Rotation  20 reps     Theraband Level (Shoulder Internal Rotation)  Level 4 (Blue)    Extension  Strengthening;Both;20 reps    Theraband Level (Shoulder Extension)  Level 4 (Blue)    Row  Strengthening;Both;20 reps    Theraband Level (Shoulder Row)  Level 4 (Blue)    Other Standing Exercises  D1 D2 extension blue band , lat pull 35#, row 35# , chest press 20#     Other Standing Exercises   15# bilat KB overhead press x 15, squat with 10# KB chest press x10x2 , 35# KB squat to floor       Shoulder Exercises: ROM/Strengthening   UBE (Upper Arm Bike)  L3 forward x 3 min, backward x 3 min               PT Short Term Goals - 08/19/19 4854      PT SHORT TERM GOAL #1   Title  Pt will be independent with initial HEP    Baseline  Pt given intial HEP    Time  2    Period  Weeks    Status  Achieved      PT SHORT TERM GOAL #2   Title  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    Period  Weeks    Status  On-going      PT LONG TERM GOAL #2   Title  left shoulder IR  will return to [facility] to return to pain-free ADLs such as dressing and grooming.    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    Baseline  33% limited improved from 36% limited on eval    Time  6    Period  Weeks     Status  On-going      PT LONG TERM GOAL #5   Title  LT shoulder AROM scaption will improve to 0-155 degrees for improved overhead reaching.    Baseline  160    Time  6    Period  Weeks    Status  Achieved            Plan - 08/21/19 0831    Clinical Impression Statement  Pt tolerating increased reps and resistance for functional strengthening. He notes steady improvement with decreased pain and increased functon.    PT Next Visit Plan  Manual, progress HEP for shoulder strengthening in order to lift 75# for job , continue lifting, add PNF patterns standing    PT Home Exercise Plan  bil rows, extension and ER, supine cane pullovers, standing cane flex and abdct isometric extension, serratus anterior, supine scap stab series green, standing scaption yellow       Patient will benefit from skilled therapeutic intervention in order to improve the following deficits and impairments:  Decreased range of motion, Decreased strength, Increased fascial restricitons, Increased muscle spasms, Postural dysfunction, Improper body mechanics, Impaired UE functional use, Pain  Visit Diagnosis: Chronic left shoulder pain  Stiffness of left shoulder, not elsewhere classified  Muscle weakness (generalized)     Problem List Patient Active Problem List   Diagnosis Date Noted  . Healthcare maintenance 08/27/2018  . Popliteal cyst, left 07/03/2017  . Diabetes ([place]) 06/04/2014  . HYPERLIPIDEMIA, MIXED 05/27/2007  . 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 Essential hypertension 05/17/2007    Dorene Ar, PTA 08/21/2019, 8:33 AM  [facility] [address] [place], [place], [address] Phone: [phone]  Fax:  [phone]  Name: Tyler Wolf MRN: [id number] Date of Birth: 1956/07/31

## 2019-08-22 ENCOUNTER — Telehealth: Payer: Self-pay | Admitting: Adult Health

## 2019-08-22 MED ORDER — LISINOPRIL 5 MG PO TABS: ORAL_TABLET | ORAL | 0 refills | Status: DC

## 2019-08-22 NOTE — Telephone Encounter (Signed)
Patient is requesting a refill of his lisinopril, if approved please send to [facility].

## 2019-08-22 NOTE — Addendum Note (Signed)
Addended by: Fonnie Mu on: 08/22/2019 09:35 AM   Modules accepted: Orders

## 2019-08-26 ENCOUNTER — Encounter: Payer: Self-pay | Admitting: Physical Therapy

## 2019-08-26 ENCOUNTER — Other Ambulatory Visit: Payer: Self-pay

## 2019-08-26 ENCOUNTER — Ambulatory Visit: Payer: BC Managed Care – PPO | Admitting: Physical Therapy

## 2019-08-26 DIAGNOSIS — M25512 Pain in left shoulder: Secondary | ICD-10-CM | POA: Diagnosis not present

## 2019-08-26 DIAGNOSIS — M25612 Stiffness of left shoulder, not elsewhere classified: Secondary | ICD-10-CM | POA: Diagnosis not present

## 2019-08-26 DIAGNOSIS — G8929 Other chronic pain: Secondary | ICD-10-CM

## 2019-08-26 DIAGNOSIS — M6281 Muscle weakness (generalized): Secondary | ICD-10-CM | POA: Diagnosis not present

## 2019-08-26 NOTE — Therapy (Signed)
[place], [place], [address] Phone: [phone]   Fax:  [phone]  Physical Therapy Treatment  Patient Details  Name: Tyler Wolf MRN: [id number] Date of Birth: [birth year] Referring Provider (PT): Mina Marble D NP   Encounter Date: 08/26/2019  PT End of Session - 08/26/19 0755    Visit Number  12    Number of Visits  13    Date for PT Re-Evaluation  08/26/19    Authorization Type  BCBS    PT Start Time  0715    PT Stop Time  0947    PT Time Calculation (min)  42 min       Past Medical History:  Diagnosis Date  . DIABETES MELLITUS, TYPE II 05/17/2007  . DIVERTICULITIS, ACUTE 08/17/2010  . 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 HYPERLIPIDEMIA, MIXED 05/27/2007  . HYPERTENSION 05/17/2007  . TOBACCO ABUSE 05/27/2007    History reviewed. No pertinent surgical history.  There were no vitals filed for this visit.  Subjective Assessment - 08/26/19 0716    Subjective  Shoulder is still feeling better.    Currently in Pain?  No/denies                       [facility] Adult PT Treatment/Exercise - 08/26/19 0001      Shoulder Exercises: Supine   Other Supine Exercises  body blade flexion 60 seconds , horizontal abduction/adduction x 60 sec,       Shoulder Exercises: Sidelying   External Rotation  10 reps   2 sets   External Rotation Weight (lbs)  4    ABduction  20 reps    ABduction Weight (lbs)  4      Shoulder Exercises: Standing   Horizontal ABduction  20 reps    Theraband Level (Shoulder Horizontal ABduction)  Level 3 (Green)    External Rotation  20 reps    External Rotation Limitations  Free motion 7#     Internal Rotation  20 reps    Internal Rotation Limitations  Free motion 7#     Extension  Strengthening;Both;20 reps    Theraband Level (Shoulder Extension)  Level 4 (Blue)    Row  Strengthening;Both;20 reps    Theraband Level (Shoulder Row)  Level 4 (Blue)    Other Standing Exercises  TRX inclined rows x 20, D1 and D2 extension 10# x 20 each at free motion , 7# mid trap and ER motion x 20    Other Standing Exercises  15# KB over head press x 20 , 45# KB squat to floor x20, 15# squat with chest press x 20 , CYbex row 35# mid and low handles, lat pull 35# x 20, chest press 15# x 20 each handle      Shoulder Exercises: ROM/Strengthening   UBE (Upper Arm Bike)  L3 forward x 3 min, backward x 3 min               PT Short Term Goals - 08/19/19 0962      PT SHORT TERM GOAL #1   Title  Pt will be independent with initial HEP    Baseline  Pt given intial HEP    Time  2    Period  Weeks    Status  Achieved      PT SHORT TERM GOAL #2   Title  Pt will be able to lift 15 pounds overhead in 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    Period  Weeks    Status  On-going      PT LONG TERM GOAL #2   Title  left shoulder IR  will return to [facility] to return to pain-free ADLs such as dressing and grooming.    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    Baseline  33% limited improved from 36% limited on eval    Time  6    Period  Weeks    Status  On-going      PT LONG TERM GOAL #5   Title  LT shoulder AROM scaption will improve to 0-155 degrees for improved  overhead reaching.    Baseline  160    Time  6    Period  Weeks    Status  Achieved            Plan - 08/26/19 0758    Clinical Impression Statement  Pt arrives reporting no pain. Continued progressive strengthening and lifting using KB and gym machines. Pt tolerated well. One more visit scheduled. He is likely ready for discharge to HEP.    PT Next Visit Plan  Finalize advanced HEP, check goals and discharge to HEP, re check FOTO    PT Home Exercise Plan  bil rows, extension and ER, supine cane pullovers, standing cane flex and abdct isometric extension, serratus anterior, supine scap stab series green, standing scaption yellow       Patient will benefit from skilled therapeutic intervention in order to improve the following deficits and impairments:  Decreased range of motion, Decreased strength, Increased fascial restricitons, Increased muscle spasms, Postural dysfunction, Improper body mechanics, Impaired UE functional use, Pain  Visit Diagnosis: Chronic left shoulder pain  Stiffness of left shoulder, not elsewhere classified     Problem List Patient Active Problem List   Diagnosis Date Noted  . Healthcare maintenance 08/27/2018  . Popliteal cyst, left 07/03/2017  . Diabetes ([place]) 06/04/2014  . HYPERLIPIDEMIA, MIXED 05/27/2007  . 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 Essential hypertension 05/17/2007    Dorene Ar, PTA 08/26/2019, 8:02 AM  [facility] [address] [place], [place], [address] Phone: [phone]   Fax:  [phone]  Name: Tyler Wolf MRN: [id number] Date of Birth: Jul 05, 1956

## 2019-08-28 ENCOUNTER — Ambulatory Visit (INDEPENDENT_AMBULATORY_CARE_PROVIDER_SITE_OTHER): Payer: BC Managed Care – PPO | Admitting: Endocrinology

## 2019-08-28 ENCOUNTER — Other Ambulatory Visit: Payer: Self-pay

## 2019-08-28 ENCOUNTER — Encounter: Payer: Self-pay | Admitting: Endocrinology

## 2019-08-28 VITALS — BP 110/64 | HR 60 | Ht 67.0 in | Wt 175.2 lb

## 2019-08-28 DIAGNOSIS — E119 Type 2 diabetes mellitus without complications: Secondary | ICD-10-CM | POA: Diagnosis not present

## 2019-08-28 DIAGNOSIS — Z794 Long term (current) use of insulin: Secondary | ICD-10-CM | POA: Diagnosis not present

## 2019-08-28 LAB — POCT GLYCOSYLATED HEMOGLOBIN (HGB A1C): Hemoglobin A1C: 8.6 % — AB (ref 4.0–5.6)

## 2019-08-28 MED ORDER — INSULIN NPH (HUMAN) (ISOPHANE) 100 UNIT/ML ~~LOC~~ SUSP: 52.0000 [IU] | Freq: Every day | SUBCUTANEOUS | 11 refills | Status: DC

## 2019-08-28 NOTE — Progress Notes (Signed)
Subjective:    Patient ID: Tyler Wolf, male    DOB: Dec 25, 1955, 63 y.o.   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   DKA: never Severe hypoglycemia: never Pancreatitis: never Pancreatic imaging: normal on 2007 CT Other: due to missing insulin, he is not a candidate for multiple daily injections; he changed Lantus to NPH, due to pattern of cbg's.   Interval history: Pt says he does not miss the insulin.  no cbg record, but states cbg varies from 115-180.   Past Medical History:  Diagnosis Date  . DIABETES MELLITUS, TYPE II 05/17/2007  . DIVERTICULITIS, ACUTE 08/17/2010  . 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 HYPERLIPIDEMIA, MIXED 05/27/2007  . HYPERTENSION 05/17/2007  . TOBACCO ABUSE 05/27/2007    No past surgical history on file.  Social History   Socioeconomic History  . Marital status: Married    Spouse name: Not on file  . Number of children: Not on file  . Years of education: Not on file  . Highest education level: Not on file  Occupational History  . Not on file  Social Needs  . Financial resource strain: Not on file  . Food insecurity    Worry: Not on file    Inability: Not on file  . Transportation needs    Medical: Not on file    Non-medical: Not on file  Tobacco Use  . Smoking status: Former Smoker    Packs/day: 0.25    Years: 40.00    Pack years: 10.00    Quit date: 11/06/2018    Years since quitting: 0.8  . Smokeless tobacco: Never Used  . Tobacco comment: electronic cigarettes, 6/1/2020pt states on and off  Substance and Sexual Activity  . Alcohol use: No    Alcohol/week: 0.0 standard drinks  . Drug use: No  . Sexual activity: Not Currently    Birth control/protection: None  Lifestyle  . Physical activity    Days per week: Not on file    Minutes per  session: Not on file  . Stress: Not on file  Relationships  . Social Herbalist on phone: Not on file    Gets together: Not on file    Attends religious service: Not on file    Active member of club or organization: Not on file    Attends meetings of clubs or organizations: Not on file    Relationship status: Not on file  . Intimate partner violence    Fear of current or ex partner: Not on file    Emotionally abused: Not on file    Physically abused: Not on file    Forced sexual activity: Not on file  Other Topics Concern  . Not on file  Social History Narrative  . Not on file    Current Outpatient Medications on File Prior to Visit  Medication Sig Dispense Refill  . aspirin 81 MG tablet Take 81 mg by mouth daily.      Marland Kitchen atenolol-chlorthalidone (TENORETIC) 50-25 MG tablet Take 1 tablet by mouth daily. 90 tablet 4  . atorvastatin (LIPITOR) 80 MG tablet TAKE 1 TABLET (80 MG TOTAL) BY MOUTH NIGHTLY 100 tablet 4  . glucose blood (CONTOUR NEXT TEST) test strip 1 each by Other route 2 (two) times daily. And lancets 2/day 200 each 3  .  Insulin Syringe-Needle U-100 (INSULIN SYRINGE 1CC/30GX5/16") 30G X 5/16" 1 ML MISC 1 Syringe by Does not apply route daily. Use to inject insulin daily 100 each 0  . lisinopril (ZESTRIL) 5 MG tablet TAKE 1 TABLET (5 MG TOTAL) BY MOUTH DAILY. 90 tablet 0  . metFORMIN (GLUCOPHAGE) 1000 MG tablet Take 1 tablet (1,000 mg total) by mouth 2 (two) times daily. 200 tablet 4  . nicotine (NICODERM CQ - DOSED IN MG/24 HOURS) 21 mg/24hr patch Place 1 patch (21 mg total) onto the skin daily. 7 patch 0   No current facility-administered medications on file prior to visit.     Allergies  Allergen Reactions  . Latex     REACTION: Rash    Family History  Problem Relation Age of Onset  . Diabetes Mother   . Heart attack Father   . Diabetes Father     BP 110/64 (BP Location: Left Arm, Patient Position: Sitting, Cuff Size: Normal)   Pulse 60   Ht 5\' 7"   (1.702 m)   Wt 175 lb 3.2 oz (79.5 kg)   SpO2 99%   BMI 27.44 kg/m    Review of Systems He .denies hypoglycemia.      Objective:   Physical Exam VITAL SIGNS:  See vs page GENERAL: no distress Pulses: dorsalis pedis intact bilat.   MSK: no deformity of the feet CV: no leg edema Skin:  no ulcer on the feet.  normal color and temp on the feet. Neuro: sensation is intact to touch on the feet Ext: several ingrown toenails.  Lab Results  Component Value Date   HGBA1C 8.6 (A) 08/28/2019        Assessment & Plan:  Insulin-requiring type 2 DM: he needs increased rx.  he declines a bigger insulin increase  Patient Instructions  Please increase the insulin to 52 units each morning.   On this type of insulin schedule, you should eat meals on a regular schedule.  If a meal is missed or significantly delayed, your blood sugar could go low.   If it is good, please come back for a follow-up appointment in 2 months.   check your blood sugar twice a day.  vary the time of day when you check, between before the 3 meals, and at bedtime.  also check if you have symptoms of your blood sugar being too high or too low.  please keep a record of the readings and bring it to your next appointment here (or you can bring the meter itself).  You can write it on any piece of paper.  please call us sooner if your blood sugar goes below 70, or if you have a lot of readings over 200.

## 2019-08-28 NOTE — Patient Instructions (Addendum)
Please increase the insulin to 52 units each morning.   On this type of insulin schedule, you should eat meals on a regular schedule.  If a meal is missed or significantly delayed, your blood sugar could go low.   If it is good, please come back for a follow-up appointment in 2 months.   check your blood sugar twice a day.  vary the time of day when you check, between before the 3 meals, and at bedtime.  also check if you have symptoms of your blood sugar being too high or too low.  please keep a record of the readings and bring it to your next appointment here (or you can bring the meter itself).  You can write it on any piece of paper.  please call us sooner if your blood sugar goes below 70, or if you have a lot of readings over 200.

## 2019-08-29 ENCOUNTER — Ambulatory Visit: Payer: BC Managed Care – PPO | Admitting: Physical Therapy

## 2019-08-29 ENCOUNTER — Encounter: Payer: Self-pay | Admitting: Physical Therapy

## 2019-08-29 DIAGNOSIS — M25512 Pain in left shoulder: Secondary | ICD-10-CM | POA: Diagnosis not present

## 2019-08-29 DIAGNOSIS — M25612 Stiffness of left shoulder, not elsewhere classified: Secondary | ICD-10-CM | POA: Diagnosis not present

## 2019-08-29 DIAGNOSIS — G8929 Other chronic pain: Secondary | ICD-10-CM

## 2019-08-29 DIAGNOSIS — M6281 Muscle weakness (generalized): Secondary | ICD-10-CM

## 2019-08-29 NOTE — Therapy (Signed)
[place], [place], [address] Phone: [phone]   Fax:  [phone]  Physical Therapy Treatment/Discharge Note  Patient Details  Name: Tyler Wolf MRN: [id number] Date of Birth: [birth year] Referring Provider (PT): Mina Marble D NP   Encounter Date: 08/29/2019  PT End of Session - 08/29/19 0757    Visit Number  13    Number of Visits  13    Date for PT Re-Evaluation  08/26/19    Authorization Type  BCBS    PT Start Time  0800    PT Stop Time  0836    PT Time Calculation (min)  36 min    Activity Tolerance  Patient tolerated treatment well    Behavior During Therapy  [facility] for tasks assessed/performed       Past Medical History:  Diagnosis Date  . DIABETES MELLITUS, TYPE II 05/17/2007  . DIVERTICULITIS, ACUTE 08/17/2010  . 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 HYPERLIPIDEMIA, MIXED 05/27/2007  . HYPERTENSION 05/17/2007  . TOBACCO ABUSE 05/27/2007    History reviewed. No pertinent surgical history.  There were no vitals filed for this visit.  Subjective Assessment - 08/29/19 0801    Subjective  Fine as frog hair   Pt doing well    Pertinent History  DM hyperlipidemia    Limitations  Lifting;Other (comment)    Diagnostic tests  none    Patient Stated Goals  I want to be pain free and work at my job.  I need to be able to lift  75 pounds    Currently in Pain?  No/denies    Pain Score  0-No pain    Pain Location  Shoulder    Pain Orientation  Left    Pain Descriptors / Indicators  Aching;Dull    Pain Type  Chronic pain    Pain Onset  More than a month ago         [facility] PT Assessment - 08/29/19 0001      Observation/Other Assessments   Focus on Therapeutic Outcomes (FOTO)   8% limitation on DC      AROM   Left Shoulder Flexion  160 Degrees    Left Shoulder ABduction  160 Degrees    Left Shoulder Internal Rotation  --   reaching to T10   Left Shoulder External Rotation  --   reach  to T2      Strength   Left Shoulder Flexion  5/5    Left Shoulder ABduction  5/5    Left Shoulder Internal Rotation  5/5    Left Shoulder External Rotation  5/5    Right Hand Grip (lbs)  78.6   76,81, 79   Left Hand Grip (lbs)  73.3   71,73,76                  [facility] Adult PT Treatment/Exercise - 08/29/19 0001      Self-Care   Self-Care  Other Self-Care Comments;Posture    Posture  discussed work situation /and posture for protecttion of shld and back    Other Self-Care Comments   discussed building physical reserve through Geographical information systems officer Activities  Work Animator  50 # to shoulder height and 25# overhead.  walking with 50# box for 50 feet      Shoulder Exercises: Prone   Other Prone Exercises  push ups on incline  with arms 90 degrees abducted and 10 x close in,  also full  push up from floor x 5      Shoulder Exercises: Standing   Other Standing Exercises  15# KB over head press x 20 , 45# KB squat floor x20,, mid and low handles, lat pull 35# x 20, chest press 15# x 20 each handle             PT Education - 08/29/19 0837    Education Details  reviewed HEP and discussed building physical reserve    Person(s) Educated  Patient    Methods  Explanation;Demonstration    Comprehension  Verbalized understanding;Returned demonstration       PT Short Term Goals - 08/19/19 0742      PT SHORT TERM GOAL #1   Title  Pt will be independent with initial HEP    Baseline  Pt given intial HEP    Time  2    Period  Weeks    Status  Achieved      PT SHORT TERM GOAL #2   Title  Pt will be able to lift 15 pounds overhead 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    Time  6    Period  Weeks    Status  Achieved      PT LONG TERM GOAL #2   Title  left shoulder IR  will return to [facility] to return to pain-free ADLs such as dressing and grooming.    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    Baseline  8% limited improved from 36% limited on eval    Time  6    Period  Weeks    Status  Achieved      PT LONG TERM GOAL #5   Title  LT shoulder AROM scaption will improve to 0-155 degrees for improved overhead reaching.    Baseline  160    Time  6    Period  Weeks    Status  Achieved            Plan - 08/29/19 1610    Clinical Impression Statement  Pt arrives with 0/10 pain and achieved all LTG's.  Pt FOTO 8% limtation from 36% on eval.  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  Pt increased bil Handgrips by at least 5 to 10 lb  See flow chart.  Pt is pleased with outcome and will be discharged today for all LTG acheived and pleased with current level of function    Personal Factors and Comorbidities  Age;Comorbidity 1;Profession    Comorbidities  DM, Hyperlipidemia    Examination-Activity Limitations  Carry;Dressing;Lift;Reach Overhead    Stability/Clinical Decision Making  Stable/Uncomplicated    Clinical Decision Making  Low    Rehab Potential  Excellent    PT Frequency  2x / week    PT Duration  6 weeks    PT Treatment/Interventions  ADLs/Self Care Home Management;Cryotherapy;Electrical Stimulation;Ultrasound;Moist Heat;Iontophoresis 65m/ml  Dexamethasone;Therapeutic activities;Therapeutic exercise;Neuromuscular re-education;Manual techniques;Patient/family education;Dry needling;Passive range of motion;Taping;Joint Manipulations    PT Next Visit Plan  DC    PT Home Exercise Plan  bil rows, extension and ER, supine cane pullovers, standing cane flex and abdct isometric extension, serratus anterior, supine scap stab series green, standing scaption yellow    Consulted and Agree with Plan of Care  Patient       Patient will benefit from skilled therapeutic intervention in order to improve the following deficits and impairments:  Decreased range of motion, Decreased strength, Increased fascial restricitons, Increased muscle spasms, Postural dysfunction, Improper body mechanics, Impaired UE functional use, Pain  Visit Diagnosis: Chronic left shoulder pain  Stiffness of left shoulder, not elsewhere classified  Muscle weakness (generalized)     Problem List Patient Active Problem List   Diagnosis Date Noted  . Healthcare maintenance 08/27/2018  . Popliteal cyst, left 07/03/2017  . Diabetes ([place] 06/04/2014  . HYPERLIPIDEMIA, MIXED 05/27/2007  . 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  Patient goals were met. Patient is being discharged  due to meeting the stated rehab goals.  ?????    and being pleased with current functional progress   LVoncille Lo PT Certified Exercise Expert for the Aging Adult  08/29/19 8:43 AM Phone: [phone]: [phone]

## 2019-09-09 ENCOUNTER — Other Ambulatory Visit: Payer: Self-pay | Admitting: Adult Health

## 2019-09-09 ENCOUNTER — Other Ambulatory Visit: Payer: Self-pay

## 2019-09-09 ENCOUNTER — Telehealth: Payer: Self-pay | Admitting: Adult Health

## 2019-09-09 DIAGNOSIS — I10 Essential (primary) hypertension: Secondary | ICD-10-CM

## 2019-09-09 MED ORDER — ATORVASTATIN CALCIUM 80 MG PO TABS: ORAL_TABLET | ORAL | 0 refills | Status: DC

## 2019-09-22 ENCOUNTER — Ambulatory Visit: Payer: BC Managed Care – PPO | Admitting: Adult Health

## 2019-09-22 ENCOUNTER — Other Ambulatory Visit: Payer: Self-pay

## 2019-09-22 ENCOUNTER — Encounter: Payer: Self-pay | Admitting: Adult Health

## 2019-09-22 DIAGNOSIS — I10 Essential (primary) hypertension: Secondary | ICD-10-CM

## 2019-09-22 DIAGNOSIS — Z0000 Encounter for general adult medical examination without abnormal findings: Secondary | ICD-10-CM | POA: Diagnosis not present

## 2019-09-22 DIAGNOSIS — E782 Mixed hyperlipidemia: Secondary | ICD-10-CM | POA: Diagnosis not present

## 2019-09-22 MED ORDER — NICOTINE 14 MG/24HR TD PT24: 14.0000 mg | MEDICATED_PATCH | Freq: Every day | TRANSDERMAL | 1 refills | Status: DC

## 2019-09-22 NOTE — Assessment & Plan Note (Signed)
Lab Results  Component Value Date   HGBA1C 8.6 (A) 08/28/2019   HGBA1C 10.3 (H) 06/18/2019   HGBA1C 9.3 (A) 03/26/2019  He reports AM BG 120-180s, most mornings 150s He denies episodes of hypoglycemia Endo increased Humulin to 52 U QD Had Endo f/u Feb 2021- Dr. Loanne Drilling

## 2019-09-22 NOTE — Progress Notes (Signed)
Subjective:    Patient ID: Tyler Wolf, male    DOB: [birth year], 63 y.o.   MRN: [id number]  HPI:11/27/2018 OV: [name] is here for 3 month f/u:T2D, HLD, HTN He stopped tobacco use 3 weeks ago- GREAT JOB! He reports AM BS 200-270 He reports forgetting to take metformin and lantus as least 1-2 times per week due to fluctuating work schedule He reports reducing sugar in diet and rarely eats CHO at evening meal- mostly vegetables and protein  He has upcoming OV with Endo/Dr. Garnette Scheuermann 12/02/2018, hopefully A1c will be much improved, last 09/23/2018- 10.0 He denies CP/dyspnea/dizziness/HA/palpitations He estimates to drink 30-40 oz plain water, prefers to hydrate with unsweetened tea 03/17/2019 OV: [name] presents for CPE He reports medication compliance, denies SE He reports AM [id number], poorly controlled due to his self reported "sweet tooth" He denies episodes of hypoglycemia Followed by Dr. Otilio Saber,. 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 He resumed tobacco use- 1/2 pack/day- requests Nicoderm. He reports "smokers cough", denies fever/myaglia's/N/V/D He denies recent travel or known exposure to [place] Maintenance: Colonoscopy-UTD, last 09/26/2010 AAA Screening- N/A due to age LDCT- pt declined Immunizations- UTD  06/18/2019 OV: [name] is here for 3 month f/u:HTN, T2D, HLD, ED, Tobacco use He is currently smoking 8-10 cigarettes/day- has been suing Nicoderm 14mg  patch for few weeks-interested in stronger strength to "finally kick this". Recent Labs       Lab Results  Component Value Date   HGBA1C 9.3 (A) 03/26/2019   HGBA1C 10.7 (A) 02/04/2019   HGBA1C 10.5 (A) 12/05/2018     Followed by Dr. 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  He was evaluated by company medical examiner- treated with rest and exercises. He denies numbness/tingling in L hand. He is R hand dominant  Fasting labs with A1c obtained today   09/22/2019 OV: [name] id here for 3 month f/u: T2D, HTN, HLD He reports AM BG 120-180s, most mornings 150s He denies episodes of hypoglycemia  He was seen by Endo/Dr. Garnette Scheuermann 08/28/2019-  "Please increase the insulin to 52 units each morning.   On this type of insulin schedule, you should eat meals on a regular schedule.  If a meal is missed or significantly delayed, your blood sugar could go low.   If it is good, please come back for a follow-up appointment in 2 months.   check your blood sugar twice a day.  vary the time of day when you check, between before the 3 meals, and at bedtime.  also check if you have symptoms of your blood sugar being too high or too low.  please keep a record of the readings and bring it to your next appointment here (or you can bring the meter itself).  You can write it on any piece of paper.  please call us sooner if your blood sugar goes below 70, or if you have a lot of  readings over 200."  Lab Results  Component Value Date   HGBA1C 8.6 (A) 08/28/2019   HGBA1C 10.3 (H) 06/18/2019   HGBA1C 9.3 (A) 03/26/2019   BP at goal 122/74, HR 53 Continue Atenolol/Chlorthalidone 50/25mg  QD, Lisiniprol 5mg  QD He does not check his BP/HR at home- denies acute cardiac sx's. He denies increase in fatigue  He has been using Nicoderm 21mg  daily patch- reduced tobacco use from pack to half pack per day- GREAT! Will send in refill at 14mg /day dosage  Patient Care Team    Relationship Specialty Notifications  Start End  Esaw Grandchild, NP PCP - General Family Medicine  08/27/18     Patient Active Problem List   Diagnosis Date Noted  . Healthcare maintenance 08/27/2018  . Popliteal cyst, left 07/03/2017  . Diabetes ([place]) 06/04/2014  . HYPERLIPIDEMIA, MIXED 05/27/2007  . 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 Essential hypertension 05/17/2007     Past Medical History:  Diagnosis Date  . DIABETES MELLITUS, TYPE II 05/17/2007  . DIVERTICULITIS, ACUTE 08/17/2010  . 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 HYPERLIPIDEMIA, MIXED 05/27/2007  . HYPERTENSION 05/17/2007  . TOBACCO ABUSE 05/27/2007     History reviewed. No pertinent surgical history.   Family History  Problem Relation Age of Onset  . Diabetes Mother   . Heart attack Father   . Diabetes Father      Social History   Substance and Sexual Activity  Drug Use No     Social History   Substance and Sexual Activity  Alcohol Use No  . Alcohol/week: 0.0 standard drinks     Social History   Tobacco Use  Smoking Status Former Smoker  . Packs/day: 0.25  . Years: 40.00  . Pack years: 10.00  . Quit date: 11/06/2018  . Years since quitting: 0.8  Smokeless Tobacco Never Used  Tobacco Comment   electronic cigarettes, 6/1/2020pt states on and off     Outpatient Encounter Medications as of 09/22/2019  Medication Sig  . aspirin 81 MG tablet Take 81 mg by mouth daily.    Marland Kitchen atenolol-chlorthalidone (TENORETIC) 50-25 MG tablet TAKE 1 TABLET BY MOUTH DAILY  . atorvastatin (LIPITOR) 80 MG tablet TAKE 1 TABLET (80 MG TOTAL) BY MOUTH NIGHTLY  . glucose blood (CONTOUR NEXT TEST) test strip 1 each by Other route 2 (two) times daily. And lancets 2/day  . insulin NPH Human (HUMULIN N) 100 UNIT/ML injection Inject 0.52 mLs (52 Units total) into the skin daily.  . Insulin Syringe-Needle U-100 (INSULIN SYRINGE 1CC/30GX5/16") 30G X 5/16" 1 ML MISC 1 Syringe by Does not apply route daily. Use to inject insulin daily  . lisinopril  (ZESTRIL) 5 MG tablet TAKE 1 TABLET (5 MG TOTAL) BY MOUTH DAILY.  . metFORMIN (GLUCOPHAGE) 1000 MG tablet Take 1 tablet (1,000 mg total) by mouth 2 (two) times daily.  . nicotine (NICODERM CQ - DOSED IN MG/24 HOURS) 14 mg/24hr patch Place 1 patch (14 mg total) onto the skin daily.  . [DISCONTINUED] nicotine (NICODERM CQ - DOSED IN MG/24 HOURS) 21 mg/24hr patch Place 1 patch (21 mg total) onto the skin daily.   No facility-administered encounter medications on file as of 09/22/2019.     Allergies: Latex  Body mass index is 27.47 kg/m.  Blood pressure 122/74, pulse (!) 53, temperature 99.1 F (37.3 C), temperature source Oral, height 5\' 7"  (1.702 m), weight 175 lb 6.4 oz (79.6 kg), SpO2 98 %.  Review of Systems  Constitutional: Negative for activity change,  appetite change, chills, diaphoresis, fatigue, fever and unexpected weight change.  HENT: Negative for congestion.   Eyes: Negative for visual disturbance.  Respiratory: Negative for cough, chest tightness, shortness of breath, wheezing and stridor.   Cardiovascular: Negative for chest pain, palpitations and leg swelling.  Gastrointestinal: Negative for abdominal distention, anal bleeding, blood in stool, constipation, diarrhea, nausea and vomiting.  Endocrine: Negative for polydipsia, polyphagia and polyuria.  Genitourinary: Negative for difficulty urinating.  Musculoskeletal: Negative for arthralgias.  Neurological: Negative for dizziness and headaches.  Hematological: Negative for adenopathy. Does not bruise/bleed easily.  Psychiatric/Behavioral: Negative for agitation, behavioral problems, confusion, decreased concentration, dysphoric mood, hallucinations, self-injury, sleep disturbance and suicidal ideas. The patient is not nervous/anxious and is not hyperactive.        Objective:   Physical Exam Vitals signs and nursing note reviewed.  Constitutional:      General: He is not in acute distress.    Appearance: Normal  appearance. He is normal weight. He is not ill-appearing, toxic-appearing or diaphoretic.  Eyes:     Extraocular Movements: Extraocular movements intact.     Conjunctiva/sclera: Conjunctivae normal.     Pupils: Pupils are equal, round, and reactive to light.  Cardiovascular:     Rate and Rhythm: Regular rhythm. Bradycardia present.     Pulses: Normal pulses.     Heart sounds: Normal heart sounds. No murmur. No friction rub. No gallop.   Pulmonary:     Effort: Pulmonary effort is normal. No respiratory distress.     Breath sounds: Normal breath sounds. No stridor. No wheezing, rhonchi or rales.  Chest:     Chest wall: No tenderness.  Skin:    Capillary Refill: Capillary refill takes less than 2 seconds.  Neurological:     Mental Status: He is alert and oriented to person, place, and time.  Psychiatric:        Mood and Affect: Mood normal.        Behavior: Behavior normal.        Thought Content: Thought content normal.        Judgment: Judgment normal.        Assessment & Plan:   1. Healthcare maintenance   2. Essential hypertension   3. HYPERLIPIDEMIA, [place] maintenance Blood pressure, weight- stable. A1c- is improving! Continue all medications as directed. Remain well hydrated, follow Diabetic Diet. Remain as active as possible. Nicoderm refill sent in at 14mg  dosage- GREAT JOB on the 50% reduction of tobacco use! You can do it! Continue follow-up with Dr. Eillison/Endocrinology as directed. Follow-up here in 3 months, sooner if needed. Continue to social distance and wear a mask when in public.  Essential hypertension BP at goal 122/74, HR 53 Continue Atenolol/Chlorthalidone 50/25mg  QD, Lisiniprol 5mg  QD   Diabetes ([facility]) Lab Results  Component Value Date   HGBA1C 8.6 (A) 08/28/2019   HGBA1C 10.3 (H) 06/18/2019   HGBA1C 9.3 (A) 03/26/2019  He reports AM BG 120-180s, most mornings 150s He denies episodes of hypoglycemia Endo increased Humulin  to 52 U QD Had Endo f/u Feb 2021- Dr. Melodye Ped, MIXED Atorvastatin 80mg  QD 06/18/2019  Cholesterol, Total 100 - 199 mg/dL 130   Triglycerides 0 - 149 mg/dL 117   HDL >39 mg/dL 36Low    VLDL Cholesterol Cal 5 - 40 mg/dL 21   LDL Chol Calc (NIH) 0 - 99 mg/dL 73    CMP 06/18/2019 LFTs-normal    FOLLOW-UP:  Return in about 3  months (around 12/21/2019) for HTN, Regular Follow Up, Diabetes, Hypercholestermia.

## 2019-09-22 NOTE — Assessment & Plan Note (Signed)
BP at goal 122/74, HR 53 Continue Atenolol/Chlorthalidone 50/25mg  QD, Lisiniprol 5mg  QD

## 2019-09-22 NOTE — Assessment & Plan Note (Signed)
>>  ASSESSMENT AND PLAN FOR HYPERLIPIDEMIA, MIXED WRITTEN ON 09/22/2019  9:18 AM BY DANFORD, KATY D, NP  Atorvastatin 80mg  QD 06/18/2019  Cholesterol, Total 100 - 199 mg/dL 595   Triglycerides 0 - 149 mg/dL 638   HDL >75 mg/dL 64PPI    VLDL Cholesterol Cal 5 - 40 mg/dL 21   LDL Chol Calc (NIH) 0 - 99 mg/dL 73    CMP 06/21/1883 LFTs-normal

## 2019-09-22 NOTE — Assessment & Plan Note (Signed)
Atorvastatin 80mg  QD 06/18/2019  Cholesterol, Total 100 - 199 mg/dL 130   Triglycerides 0 - 149 mg/dL 117   HDL >39 mg/dL 36Low    VLDL Cholesterol Cal 5 - 40 mg/dL 21   LDL Chol Calc (NIH) 0 - 99 mg/dL 73    CMP 06/18/2019 LFTs-normal

## 2019-09-22 NOTE — Patient Instructions (Addendum)
Diabetes Mellitus and Nutrition, Adult When you have diabetes (diabetes mellitus), it is very important to have healthy eating habits because your blood sugar (glucose) levels are greatly affected by what you eat and drink. Eating healthy foods in the appropriate amounts, at about the same times every day, can help you:  Control your blood glucose.  Lower your risk of heart disease.  Improve your blood pressure.  Reach or maintain a healthy weight. Every person with diabetes is different, and each person has different needs for a meal plan. Your health care provider may recommend that you work with a diet and nutrition specialist (dietitian) to make a meal plan that is best for you. Your meal plan may vary depending on factors such as:  The calories you need.  The medicines you take.  Your weight.  Your blood glucose, blood pressure, and cholesterol levels.  Your activity level.  Other health conditions you have, such as heart or kidney disease. How do carbohydrates affect me? Carbohydrates, also called carbs, affect your blood glucose level more than any other type of food. Eating carbs naturally raises the amount of glucose in your blood. Carb counting is a method for keeping track of how many carbs you eat. Counting carbs is important to keep your blood glucose at a healthy level, especially if you use insulin or take certain oral diabetes medicines. It is important to know how many carbs you can safely have in each meal. This is different for every person. Your dietitian can help you calculate how many carbs you should have at each meal and for each snack. Foods that contain carbs include:  Bread, cereal, rice, pasta, and crackers.  Potatoes and corn.  Peas, beans, and lentils.  Milk and yogurt.  Fruit and juice.  Desserts, such as cakes, cookies, ice cream, and candy. How does alcohol affect me? Alcohol can cause a sudden decrease in blood glucose (hypoglycemia),  especially if you use insulin or take certain oral diabetes medicines. Hypoglycemia can be a life-threatening condition. Symptoms of hypoglycemia (sleepiness, dizziness, and confusion) are similar to symptoms of having too much alcohol. If your health care provider says that alcohol is safe for you, follow these guidelines:  Limit alcohol intake to no more than 1 drink per day for nonpregnant women and 2 drinks per day for men. One drink equals 12 oz of beer, 5 oz of wine, or 1 oz of hard liquor.  Do not drink on an empty stomach.  Keep yourself hydrated with water, diet soda, or unsweetened iced tea.  Keep in mind that regular soda, juice, and other mixers may contain a lot of sugar and must be counted as carbs. What are tips for following this plan?  Reading food labels  Start by checking the serving size on the "Nutrition Facts" label of packaged foods and drinks. The amount of calories, carbs, fats, and other nutrients listed on the label is based on one serving of the item. Many items contain more than one serving per package.  Check the total grams (g) of carbs in one serving. You can calculate the number of servings of carbs in one serving by dividing the total carbs by 15. For example, if a food has 30 g of total carbs, it would be equal to 2 servings of carbs.  Check the number of grams (g) of saturated and trans fats in one serving. Choose foods that have low or no amount of these fats.  Check the number of   milligrams (mg) of salt (sodium) in one serving. Most people should limit total sodium intake to less than 2,300 mg per day.  Always check the nutrition information of foods labeled as "low-fat" or "nonfat". These foods may be higher in added sugar or refined carbs and should be avoided.  Talk to your dietitian to identify your daily goals for nutrients listed on the label. Shopping  Avoid buying canned, premade, or processed foods. These foods tend to be high in fat, sodium,  and added sugar.  Shop around the outside edge of the grocery store. This includes fresh fruits and vegetables, bulk grains, fresh meats, and fresh dairy. Cooking  Use low-heat cooking methods, such as baking, instead of high-heat cooking methods like deep frying.  Cook using healthy oils, such as olive, canola, or sunflower oil.  Avoid cooking with butter, cream, or high-fat meats. Meal planning  Eat meals and snacks regularly, preferably at the same times every day. Avoid going long periods of time without eating.  Eat foods high in fiber, such as fresh fruits, vegetables, beans, and whole grains. Talk to your dietitian about how many servings of carbs you can eat at each meal.  Eat 4-6 ounces (oz) of lean protein each day, such as lean meat, chicken, fish, eggs, or tofu. One oz of lean protein is equal to: ? 1 oz of meat, chicken, or fish. ? 1 egg. ?  cup of tofu.  Eat some foods each day that contain healthy fats, such as avocado, nuts, seeds, and fish. Lifestyle  Check your blood glucose regularly.  Exercise regularly as told by your health care provider. This may include: ? 150 minutes of moderate-intensity or vigorous-intensity exercise each week. This could be brisk walking, biking, or water aerobics. ? Stretching and doing strength exercises, such as yoga or weightlifting, at least 2 times a week.  Take medicines as told by your health care provider.  Do not use any products that contain nicotine or tobacco, such as cigarettes and e-cigarettes. If you need help quitting, ask your health care provider.  Work with a Social worker or diabetes educator to identify strategies to manage stress and any emotional and social challenges. Questions to ask a health care provider  Do I need to meet with a diabetes educator?  Do I need to meet with a dietitian?  What number can I call if I have questions?  When are the best times to check my blood glucose? Where to find more  information:  American Diabetes Association: diabetes.org  Academy of Nutrition and Dietetics: www.eatright.[employer] of Diabetes and Digestive and Kidney Diseases (NIH): [employer] Summary  A healthy meal plan will help you control your blood glucose and maintain a healthy lifestyle.  Working with a diet and nutrition specialist (dietitian) can help you make a meal plan that is best for you.  Keep in mind that carbohydrates (carbs) and alcohol have immediate effects on your blood glucose levels. It is important to count carbs and to use alcohol carefully. This information is not intended to replace advice given to you by your health care provider. Make sure you discuss any questions you have with your health care provider. Document Released: 06/29/2005 Document Revised: 09/14/2017 Document Reviewed: 11/06/2016 Elsevier Patient Education  [place].  Blood pressure, weight- stable. A1c- is improving! Continue all medications as directed. Remain well hydrated, follow Diabetic Diet. Remain as active as possible. Nicoderm refill sent in at 14mg  dosage- GREAT JOB on the 50%  reduction of tobacco use! You can do it! Continue follow-up with Dr. Eillison/Endocrinology as directed. Follow-up here in 3 months, sooner if needed. Continue to social distance and wear a mask when in public. GREAT TO SEE YOU!

## 2019-09-22 NOTE — Assessment & Plan Note (Signed)
Blood pressure, weight- stable. A1c- is improving! Continue all medications as directed. Remain well hydrated, follow Diabetic Diet. Remain as active as possible. Nicoderm refill sent in at 14mg  dosage- GREAT JOB on the 50% reduction of tobacco use! You can do it! Continue follow-up with Dr. Eillison/Endocrinology as directed. Follow-up here in 3 months, sooner if needed. Continue to social distance and wear a mask when in public.

## 2019-09-29 ENCOUNTER — Other Ambulatory Visit: Payer: Self-pay

## 2019-09-29 ENCOUNTER — Encounter: Payer: Self-pay | Admitting: Emergency Medicine

## 2019-09-29 ENCOUNTER — Ambulatory Visit
Admission: EM | Admit: 2019-09-29 | Discharge: 2019-09-29 | Disposition: A | Discharge status: Home / Self Care | Payer: BC Managed Care – PPO | Attending: Emergency Medicine | Admitting: Emergency Medicine

## 2019-09-29 DIAGNOSIS — F17210 Nicotine dependence, cigarettes, uncomplicated: Secondary | ICD-10-CM | POA: Diagnosis not present

## 2019-09-29 DIAGNOSIS — R509 Fever, unspecified: Secondary | ICD-10-CM | POA: Diagnosis not present

## 2019-09-29 DIAGNOSIS — Z20828 Contact with and (suspected) exposure to other viral communicable diseases: Secondary | ICD-10-CM | POA: Diagnosis not present

## 2019-09-29 NOTE — ED Triage Notes (Signed)
PT presents to [facility] for assessment of chills and temperature of 100 degrees yesterday, c/o "allergies", denies sore throat, cough, n/v/d.

## 2019-09-29 NOTE — ED Provider Notes (Signed)
[facility]    CSN: [id number] Arrival date & time: 09/29/19  [id number]      History   Chief Complaint Chief Complaint  Patient presents with  . Chills    HPI Tyler Wolf is a 63 y.o. male with history of tobacco use, hypertension, hyperlipidemia, diabetes presenting for allergy-like symptoms.  Patient states he deals with runny, stuffy nose annually "for years ".  Has not been taking anything for this other than "smoking a cigarette ".  Patient denies sore throat, cough, known Covid contact.  Largely seeking evaluation today at the request of his wife and daughter due to reported temperature of 100 F yesterday.  Requesting Covid testing.   Past Medical History:  Diagnosis Date  . DIABETES MELLITUS, TYPE II 05/17/2007  . DIVERTICULITIS, ACUTE 08/17/2010  . 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 HYPERLIPIDEMIA, MIXED 05/27/2007  . HYPERTENSION 05/17/2007  . TOBACCO ABUSE 05/27/2007    Patient Active Problem List   Diagnosis Date Noted  . Healthcare maintenance 08/27/2018  . Popliteal cyst, left 07/03/2017  . Diabetes ([place]) 06/04/2014  . HYPERLIPIDEMIA, MIXED 05/27/2007  . 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 Essential hypertension 05/17/2007    History reviewed. No pertinent surgical history.     Home Medications    Prior to Admission medications   Medication Sig Start Date End Date Taking? Authorizing Provider  aspirin 81 MG tablet Take 81 mg by mouth daily.      [provider]  atenolol-chlorthalidone (TENORETIC) 50-25 MG tablet TAKE 1 TABLET BY MOUTH DAILY 09/09/19   Danford, Valetta Fuller D, NP  atorvastatin (LIPITOR) 80 MG tablet TAKE 1 TABLET (80 MG TOTAL) BY MOUTH NIGHTLY 09/09/19   Danford, Katy D, NP  glucose blood (CONTOUR NEXT TEST) test strip 1 each by Other route 2 (two) times daily. And lancets 2/day 02/04/19   Renato Shin, MD  insulin NPH Human (HUMULIN N) 100 UNIT/ML injection Inject 0.52 mLs (52 Units total) into the skin daily.  08/28/19   Renato Shin, MD  Insulin Syringe-Needle U-100 (INSULIN SYRINGE 1CC/30GX5/16") 30G X 5/16" 1 ML MISC 1 Syringe by Does not apply route daily. Use to inject insulin daily 04/03/19   Renato Shin, MD  lisinopril (ZESTRIL) 5 MG tablet TAKE 1 TABLET (5 MG TOTAL) BY MOUTH DAILY. 08/22/19   Danford, Valetta Fuller D, NP  metFORMIN (GLUCOPHAGE) 1000 MG tablet Take 1 tablet (1,000 mg total) by mouth 2 (two) times daily. 07/09/18   Dorena Cookey, MD  nicotine (NICODERM CQ - DOSED IN MG/24 HOURS) 14 mg/24hr patch Place 1 patch (14 mg total) onto the skin daily. 09/22/19   Esaw Grandchild, NP    Family History Family History  Problem Relation Age of Onset  . Diabetes Mother   . Heart attack Father   . Diabetes Father     Social History Social History   Tobacco Use  . Smoking status: Former Smoker    Packs/day: 0.25    Years: 40.00    Pack years: 10.00    Quit date: 11/06/2018    Years since quitting: 0.8  . Smokeless tobacco: Never Used  . Tobacco comment: electronic cigarettes, 6/1/2020pt states on and off  Substance Use Topics  . Alcohol use: No    Alcohol/week: 0.0 standard drinks  . Drug use: No     Allergies   Latex   Review of Systems Review of Systems  Constitutional: Positive for fever. Negative for activity change, appetite change and fatigue.  Reported T-max 100 F, self-limited  HENT: Positive for rhinorrhea and sneezing. Negative for congestion, dental problem, ear pain, facial swelling, hearing loss, postnasal drip, sinus pressure, sinus pain, sore throat, tinnitus, trouble swallowing and voice change.   Eyes: Negative for photophobia, pain, discharge, redness, itching and visual disturbance.  Respiratory: Negative for cough, chest tightness, shortness of breath and wheezing.   Cardiovascular: Negative for chest pain and palpitations.  Gastrointestinal: Negative for diarrhea and vomiting.  Musculoskeletal: Negative for arthralgias and myalgias.  Neurological:  Negative for dizziness and headaches.     Physical Exam Triage Vital Signs ED Triage Vitals  Enc Vitals Group     BP 09/29/19 0825 133/86     Pulse Rate 09/29/19 0825 90     Resp 09/29/19 0825 18     Temp 09/29/19 0825 97.9 F (36.6 C)     Temp Source 09/29/19 0825 Temporal     SpO2 09/29/19 0825 95 %     Weight --      Height --      Head Circumference --      Peak Flow --      Pain Score 09/29/19 0826 0     Pain Loc --      Pain Edu? --      Excl. in [place]? --    No data found.  Updated Vital Signs BP 133/86 (BP Location: Left Arm)   Pulse 90   Temp 97.9 F (36.6 C) (Temporal)   Resp 18   SpO2 95%   Visual Acuity Right Eye Distance:   Left Eye Distance:   Bilateral Distance:    Right Eye Near:   Left Eye Near:    Bilateral Near:     Physical Exam Constitutional:      General: He is not in acute distress. HENT:     Head: Normocephalic and atraumatic.  Eyes:     General: No scleral icterus.    Pupils: Pupils are equal, round, and reactive to light.  Cardiovascular:     Rate and Rhythm: Normal rate.  Pulmonary:     Effort: Pulmonary effort is normal. No respiratory distress.     Breath sounds: No wheezing.  Skin:    Coloration: Skin is not jaundiced or pale.  Neurological:     Mental Status: He is alert and oriented to person, place, and time.      UC Treatments / Results  Labs (all labs ordered are listed, but only abnormal results are displayed) Labs Reviewed  NOVEL CORONAVIRUS, NAA    EKG   Radiology No results found.  Procedures Procedures (including critical care time)  Medications Ordered in UC Medications - No data to display  Initial Impression / Assessment and Plan / UC Course  I have reviewed the triage vital signs and the nursing notes.  Pertinent labs & imaging results that were available during my care of the patient were reviewed by me and considered in my medical decision making (see chart for details).     Patient  afebrile, nontoxic, with SpO2 95%.  Covid PCR pending.  Patient to quarantine until results are back.  We will continue supportive management.  Encourage smoking cessation/decrease use.  Return precautions discussed, patient verbalized understanding and is agreeable to plan. Final Clinical Impressions(s) / UC Diagnoses   Final diagnoses:  Fever, unspecified     Discharge Instructions     Your COVID test is pending - it is important to quarantine / isolate at home until  your results are back. If you test positive and would like further evaluation for persistent or worsening symptoms, you may schedule an E-visit or virtual (video) visit throughout the [facility] app or website.  PLEASE NOTE: If you develop severe chest pain or shortness of breath please go to the ER or call 9-1-1 for further evaluation --> DO NOT schedule electronic or virtual visits for this. Please call our office for further guidance / recommendations as needed.    ED Prescriptions    None     PDMP not reviewed this encounter.   Hall[place]Potvin, [place], [place] 09/29/19 1101

## 2019-09-29 NOTE — ED Notes (Signed)
Patient able to ambulate independently  

## 2019-09-29 NOTE — Discharge Instructions (Signed)
Your COVID test is pending - it is important to quarantine / isolate at home until your results are back. °If you test positive and would like further evaluation for persistent or worsening symptoms, you may schedule an E-visit or virtual (video) visit throughout the [facility] MyChart app or website. ° °PLEASE NOTE: If you develop severe chest pain or shortness of breath please go to the ER or call 9-1-1 for further evaluation --> DO NOT schedule electronic or virtual visits for this. °Please call our office for further guidance / recommendations as needed. °

## 2019-10-01 LAB — NOVEL CORONAVIRUS, NAA: SARS-CoV-2, NAA: NOT DETECTED

## 2019-10-22 ENCOUNTER — Other Ambulatory Visit: Payer: Self-pay | Admitting: Adult Health

## 2019-10-22 NOTE — Telephone Encounter (Signed)
We have not prescribed this medication for the patient previously.  Please review and refill if appropriate.  T. Leahna Hewson, CMA  

## 2019-10-22 NOTE — Telephone Encounter (Signed)
Mr. Tyler Wolf is managed by Endocrinology- please have him request Metformin from that speciality. Thanks! Valetta Fuller

## 2019-10-23 ENCOUNTER — Other Ambulatory Visit: Payer: Self-pay | Admitting: Endocrinology

## 2019-11-10 ENCOUNTER — Telehealth: Payer: Self-pay | Admitting: Endocrinology

## 2019-11-10 ENCOUNTER — Other Ambulatory Visit: Payer: Self-pay

## 2019-11-10 ENCOUNTER — Ambulatory Visit: Payer: BC Managed Care – PPO | Admitting: Endocrinology

## 2019-11-10 DIAGNOSIS — H43821 Vitreomacular adhesion, right eye: Secondary | ICD-10-CM | POA: Diagnosis not present

## 2019-11-10 DIAGNOSIS — E119 Type 2 diabetes mellitus without complications: Secondary | ICD-10-CM | POA: Diagnosis not present

## 2019-11-10 DIAGNOSIS — H43822 Vitreomacular adhesion, left eye: Secondary | ICD-10-CM | POA: Diagnosis not present

## 2019-11-10 DIAGNOSIS — H2513 Age-related nuclear cataract, bilateral: Secondary | ICD-10-CM | POA: Diagnosis not present

## 2019-11-10 LAB — HM DIABETES EYE EXAM

## 2019-11-10 NOTE — Telephone Encounter (Signed)
NA

## 2019-11-12 ENCOUNTER — Ambulatory Visit: Payer: BC Managed Care – PPO | Admitting: Endocrinology

## 2019-11-12 ENCOUNTER — Other Ambulatory Visit: Payer: Self-pay

## 2019-11-12 ENCOUNTER — Encounter: Payer: Self-pay | Admitting: Endocrinology

## 2019-11-12 VITALS — BP 100/70 | HR 66 | Ht 67.0 in | Wt 178.8 lb

## 2019-11-12 DIAGNOSIS — Z794 Long term (current) use of insulin: Secondary | ICD-10-CM

## 2019-11-12 DIAGNOSIS — E119 Type 2 diabetes mellitus without complications: Secondary | ICD-10-CM | POA: Diagnosis not present

## 2019-11-12 LAB — POCT GLYCOSYLATED HEMOGLOBIN (HGB A1C): Hemoglobin A1C: 9.2 % — AB (ref 4.0–5.6)

## 2019-11-12 MED ORDER — INSULIN NPH (HUMAN) (ISOPHANE) 100 UNIT/ML ~~LOC~~ SUSP: 60.0000 [IU] | Freq: Every day | SUBCUTANEOUS | 11 refills | Status: DC

## 2019-11-12 NOTE — Progress Notes (Signed)
Subjective:    Patient ID: Tyler Wolf, male    DOB: [birth year], 64 y.o.   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   DKA: never Severe hypoglycemia: never Pancreatitis: never Pancreatic imaging: normal on 2007 CT SDOH: due to missing insulin, he is not a candidate for multiple daily injections.   Other: he changed Lantus to NPH, due to pattern of cbg's.   Interval history: Pt says he rarely misses the insulin.  no cbg record, but states cbg varies from 115-144.  No recent steroids.  pt states he feels well in general.  Past Medical History:  Diagnosis Date  . DIABETES MELLITUS, TYPE II 05/17/2007  . DIVERTICULITIS, ACUTE 08/17/2010  . 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 HYPERLIPIDEMIA, MIXED 05/27/2007  . HYPERTENSION 05/17/2007  . TOBACCO ABUSE 05/27/2007    No past surgical history on file.  Social History   Socioeconomic History  . Marital status: Married    Spouse name: Not on file  . Number of children: Not on file  . Years of education: Not on file  . Highest education level: Not on file  Occupational History  . Not on file  Tobacco Use  . Smoking status: Former Smoker    Packs/day: 0.25    Years: 40.00    Pack years: 10.00    Quit date: 11/06/2018    Years since quitting: 1.0  . Smokeless tobacco: Never Used  . Tobacco comment: electronic cigarettes, 6/1/2020pt states on and off  Substance and Sexual Activity  . Alcohol use: No    Alcohol/week: 0.0 standard drinks  . Drug use: No  . Sexual activity: Not Currently    Birth control/protection: None  Other Topics Concern  . Not on file  Social History Narrative  . Not on file   Social Determinants of Health   Financial Resource Strain:   . Difficulty of Paying Living Expenses: Not on file  Food Insecurity:   .  Worried About Charity fundraiser in the Last Year: Not on file  . Ran Out of Food in the Last Year: Not on file  Transportation Needs:   . Lack of Transportation (Medical): Not on file  . Lack of Transportation (Non-Medical): Not on file  Physical Activity:   . Days of Exercise per Week: Not on file  . Minutes of Exercise per Session: Not on file  Stress:   . Feeling of Stress : Not on file  Social Connections:   . Frequency of Communication with Friends and Family: Not on file  . Frequency of Social Gatherings with Friends and Family: Not on file  . Attends Religious Services: Not on file  . Active Member of Clubs or Organizations: Not on file  . Attends Archivist Meetings: Not on file  . Marital Status: Not on file  Intimate Partner Violence:   . Fear of Current or Ex-Partner: Not on file  . Emotionally Abused: Not on file  . Physically Abused: Not on file  . Sexually Abused: Not on file    Current Outpatient Medications on File Prior to Visit  Medication Sig Dispense Refill  . aspirin 81 MG tablet Take 81 mg by mouth daily.      Marland Kitchen atenolol-chlorthalidone (TENORETIC) 50-25 MG tablet TAKE 1 TABLET BY MOUTH DAILY 90 tablet 0  .  atorvastatin (LIPITOR) 80 MG tablet TAKE 1 TABLET (80 MG TOTAL) BY MOUTH NIGHTLY 90 tablet 0  . glucose blood (CONTOUR NEXT TEST) test strip 1 each by Other route 2 (two) times daily. And lancets 2/day 200 each 3  . Insulin Syringe-Needle U-100 (INSULIN SYRINGE 1CC/30GX5/16") 30G X 5/16" 1 ML MISC 1 Syringe by Does not apply route daily. Use to inject insulin daily 100 each 0  . lisinopril (ZESTRIL) 5 MG tablet TAKE 1 TABLET (5 MG TOTAL) BY MOUTH DAILY. 90 tablet 0  . metFORMIN (GLUCOPHAGE) 1000 MG tablet TAKE 1 TABLET BY MOUTH TWICE A DAY 180 tablet 4  . nicotine (NICODERM CQ - DOSED IN MG/24 HOURS) 14 mg/24hr patch Place 1 patch (14 mg total) onto the skin daily. 28 patch 1   No current facility-administered medications on file prior to visit.      Allergies  Allergen Reactions  . Latex     REACTION: Rash    Family History  Problem Relation Age of Onset  . Diabetes Mother   . Heart attack Father   . Diabetes Father     BP 100/70 (BP Location: Left Arm, Patient Position: Sitting, Cuff Size: Normal)   Pulse 66   Ht 5\' 7"  (1.702 m)   Wt 178 lb 12.8 oz (81.1 kg)   SpO2 97%   BMI 28.00 kg/m    Review of Systems He denies hypoglycemia.      Objective:   Physical Exam VITAL SIGNS:  See vs page GENERAL: no distress Pulses: dorsalis pedis intact bilat.   MSK: no deformity of the feet CV: no leg edema Skin:  no ulcer on the feet.  normal color and temp on the feet. Neuro: sensation is intact to touch on the feet Ext: there is bilateral onychomycosis of the toenails, and ingrown nails  Lab Results  Component Value Date   HGBA1C 9.2 (A) 11/12/2019       Assessment & Plan:  Insulin-requiring type 2 DM: worse  Patient Instructions  Please increase the insulin to 60 units each morning.   On this type of insulin schedule, you should eat meals on a regular schedule.  If a meal is missed or significantly delayed, your blood sugar could go low.   If it is good, please come back for a follow-up appointment in 2 months.   check your blood sugar twice a day.  vary the time of day when you check, between before the 3 meals, and at bedtime.  also check if you have symptoms of your blood sugar being too high or too low.  please keep a record of the readings and bring it to your next appointment here (or you can bring the meter itself).  You can write it on any piece of paper.  please call us sooner if your blood sugar goes below 70, or if you have a lot of readings over 200.

## 2019-11-12 NOTE — Patient Instructions (Addendum)
Please increase the insulin to 60 units each morning.   On this type of insulin schedule, you should eat meals on a regular schedule.  If a meal is missed or significantly delayed, your blood sugar could go low.   If it is good, please come back for a follow-up appointment in 2 months.   check your blood sugar twice a day.  vary the time of day when you check, between before the 3 meals, and at bedtime.  also check if you have symptoms of your blood sugar being too high or too low.  please keep a record of the readings and bring it to your next appointment here (or you can bring the meter itself).  You can write it on any piece of paper.  please call us sooner if your blood sugar goes below 70, or if you have a lot of readings over 200.

## 2019-12-23 ENCOUNTER — Ambulatory Visit: Payer: BC Managed Care – PPO | Admitting: Adult Health

## 2019-12-26 ENCOUNTER — Encounter: Payer: Self-pay | Admitting: Family Medicine

## 2019-12-26 ENCOUNTER — Ambulatory Visit: Payer: BC Managed Care – PPO | Admitting: Family Medicine

## 2019-12-26 ENCOUNTER — Other Ambulatory Visit: Payer: Self-pay

## 2019-12-26 VITALS — BP 121/63 | HR 58 | Temp 98.0°F | Ht 67.0 in | Wt 178.4 lb

## 2019-12-26 DIAGNOSIS — E1169 Type 2 diabetes mellitus with other specified complication: Secondary | ICD-10-CM | POA: Diagnosis not present

## 2019-12-26 DIAGNOSIS — E1159 Type 2 diabetes mellitus with other circulatory complications: Secondary | ICD-10-CM

## 2019-12-26 DIAGNOSIS — F17200 Nicotine dependence, unspecified, uncomplicated: Secondary | ICD-10-CM | POA: Diagnosis not present

## 2019-12-26 DIAGNOSIS — Z716 Tobacco abuse counseling: Secondary | ICD-10-CM

## 2019-12-26 DIAGNOSIS — E1165 Type 2 diabetes mellitus with hyperglycemia: Secondary | ICD-10-CM

## 2019-12-26 DIAGNOSIS — E782 Mixed hyperlipidemia: Secondary | ICD-10-CM

## 2019-12-26 DIAGNOSIS — I152 Hypertension secondary to endocrine disorders: Secondary | ICD-10-CM

## 2019-12-26 DIAGNOSIS — I10 Essential (primary) hypertension: Secondary | ICD-10-CM

## 2019-12-26 NOTE — Progress Notes (Signed)
Impression and Recommendations:    1. Poorly controlled diabetes mellitus ([place])- mgt per Endo; Dr Tyler Wolf   2. Hypertension associated with diabetes ([place])   3. Mixed diabetic hyperlipidemia associated with type 2 diabetes mellitus ([place])   4. Tobacco use disorder   5. Tobacco abuse counseling     Of note, this is my first time meeting patient.  Patient is new to me and was previously being cared for at our office by Tyler Marble, NP, who no longer works at primary care [employer].   Poorly Controlled Diabetes Mellitus - Managed by Dr. Loanne Wolf of Endocrinology.  - Last A1c 9.2, elevated one month ago, up from 8.6 four months ago. - Discussed that patient's A1c is poorly controlled.  - Advised patient to consult with endocrinology regarding modification of treatment plan to bring his A1c under 8.0, preferably under 7.0.  - Encouraged patient to check his blood sugars at home.  - Advised patient to cut down on eating sweets such as cookies, cakes, honey buns, etc. - Encouraged patient to drink fewer sugared beverages, ideally none at all.  - Will continue to monitor alongside specialist.   Hypertension associated with Type 2 DM - Blood pressure currently is at goal, stable. - Patient will continue current treatment regimen.  See med list.  - Advised patient to watch for symptomatic hypotension and be sure to change positions slowly if he ever feels dizzy or lightheaded.  - Encouraged patient to avoid use of ladders at work, etc., especially if he ever feels symptomatic.  - Counseled patient on pathophysiology of disease and discussed various treatment options, which always includes dietary and lifestyle modification as first line.   - Lifestyle changes such as dash and heart healthy diets and engaging in a regular exercise program discussed extensively with patient.   - Ambulatory blood pressure monitoring encouraged at least 3 times weekly.  Keep log and bring in every  office visit.  Reminded patient that if they ever feel poorly in any way, to check their blood pressure and pulse.  - We will continue to monitor.   Mixed Diabetic Hyperlipidemia associated with Type 2 DM - Last FLP obtained 6 months ago Triglycerides = 117, WNL. HDL = 36, low. LDL = 73, WNL.  - Cholesterol levels are at goal.  - Pt will continue current treatment regimen.  See med list.  - Ongoing prudent dietary changes such as low saturated & trans fat diets for hyperlipidemia and low carb diets for hypertriglyceridemia discussed.  - Encouraged patient to follow AHA guidelines for regular exercise.  - We will continue to monitor and re-check as discussed.   Tobacco Use Disorder, Tobacco Abuse Counseling Told pt to think seriously about quitting smoking!  Told pt it is very important for his/her health and well being.  - Smoking cessation instruction/ counseling given of at least 5 minutes:  counseled patient on the dangers of tobacco use and reviewed strategies to maximize success - Discussed with patient that there are multiple treatments to aid in quitting smoking, however I explained none will work unless pt really wants to quit - Told to call 1-800-QUIT-NOW 4702774295) for free smoking cessation counseling and support, or pt can go online to www.heart.org - the American Heart Association website and search "quit smoking ".    Health Counseling & Preventative Maintenance - Advised patient to continue working toward exercising to improve overall mental, physical, and emotional health.    - Encouraged patient to  engage in daily physical activity as tolerated, especially a formal exercise routine.  Recommended that the patient eventually strive for at least 150 minutes of moderate cardiovascular activity per week according to guidelines established by the [facility].   - Healthy dietary habits encouraged, including low-sugar, low-carb, with high amounts of lean protein in diet.    - Patient should also consume adequate amounts of water.  - Health counseling performed.  All questions answered.   Recommendations - Discussed need for patient to return for CPE and repeat screenings as recommended, including prostate examination and low-dose CT screen for lung cancer.  - Return for CPE and full fasting lab work same day near future.   Education and routine counseling performed. Handouts provided.   Medications Discontinued During This Encounter  Medication Reason  . nicotine (NICODERM CQ - DOSED IN MG/24 HOURS) 14 mg/24hr patch Patient Preference     The patient was counseled, risk factors were discussed, anticipatory guidance given.  Gross side effects, risk and benefits, and alternatives of medications discussed with patient.  Patient is aware that all medications have potential side effects and we are unable to predict every side effect or drug-drug interaction that may occur.  Expresses verbal understanding and consents to current therapy plan and treatment regimen.   Return for CPE and full fasting lab work same day near future.   Please see AVS handed out to patient at the end of our visit for further patient instructions/ counseling done pertaining to today's office visit.    Note:  This document was prepared using Dragon voice recognition software and may include unintentional dictation errors.   The [place] was signed into law in 2016 which includes the topic of electronic health records.  This provides immediate access to information in MyChart.  This includes consultation notes, operative notes, office notes, lab results and pathology reports.  If you have any questions about what you read please let us know at your next visit or call us at the office.  We are right here with you.   This case required medical decision making of at least moderate complexity.  This document serves as a record of services personally performed by Tyler Dance, DO. It was created on her behalf by Tyler Wolf, a trained medical scribe. The creation of this record is based on the scribe's personal observations and the provider's statements to them.    The above documentation from Tyler Wolf, medical scribe, has been reviewed by Tyler Wolf, D.O.       Subjective:     I, Tyler Wolf, am serving as Education administrator for [employer].  HPI: Tyler Wolf is a 64 y.o. male who presents to [place] [facility] today for follow up of [place].    Per patient, he has not been for a physical examination in a while.  - Hydration States he drinks diet Dr. 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  Confirms that his job is very active and "I can do about any of it.  You've gotta be versatile."  He's worked for them for the past 35 years.   - Smoking Says he would like to quit smoking, but "I don't know what it's going to take to make me quit."  Thinks he's smoked since age 32, for about 70 years.  On average, thinks he's smoked about a pack per day, but he's been cut down to about a half-pack per day for a while.  He can't remember when he reduced his smoking; just notes "packs have been lasting me for about two days."  Notes he has had a CT screen for lung cancer in the past.  HPI:   Diabetes Mellitus: Managed by Dr. Loanne Wolf of Endocrinology  Patient confirms that his A1c/blood sugar hasn't been controlled for a while.  States "I've got a little bit of a sweet tooth."  Says he loves cookies and honey buns and cake.  Says "I try not to mess with it too much; I've been eating more Slim Jims than I have sweets lately."   - Patient reports good compliance with therapy plan: medication and/or lifestyle modification  - His denies acute concerns or problems related to treatment plan  - He  denies new concerns.  Denies polyuria/polydipsia, hypo/ hyperglycemia symptoms.  Denies new onset of: chest pain, exercise intolerance, shortness of breath, dizziness, visual changes, headache, lower extremity swelling or claudication.   Last A1C in the office was:  Lab Results  Component Value Date   HGBA1C 9.2 (A) 11/12/2019   HGBA1C 8.6 (A) 08/28/2019   HGBA1C 10.3 (H) 06/18/2019   Lab Results  Component Value Date   MICROALBUR 30 06/18/2019   LDLCALC 73 06/18/2019   CREATININE 0.69 (L) 06/18/2019   BP Readings from Last 3 Encounters:  12/26/19 121/63  11/12/19 100/70  09/29/19 133/86   Wt Readings from Last 3 Encounters:  12/26/19 178 lb 6.4 oz (80.9 kg)  11/12/19 178 lb 12.8 oz (81.1 kg)  09/22/19 175 lb 6.4 oz (79.6 kg)   HTN:  - Controlled.  Denies concerns about his blood pressure.  - Patient reports good compliance with blood pressure medications  - Denies medication S-E  - He denies new onset of: chest pain, exercise intolerance, shortness of breath, dizziness, visual changes, headache, lower extremity swelling or claudication.   Last 3 blood pressure readings in our office are as follows: BP Readings from Last 3 Encounters:  12/26/19 121/63  11/12/19 100/70  09/29/19 133/86    Pulse Readings from Last 3 Encounters:  12/26/19 (!) 58  11/12/19 66  09/29/19 90    Filed Weights   12/26/19 0813  Weight: 178 lb 6.4 oz (80.9 kg)   HPI:  Hyperlipidemia:  64 y.o. male here for cholesterol follow-up.   - Patient reports good compliance with treatment plan of:  medication and/ or lifestyle management.    - Patient denies any acute concerns or problems with management plan   - He denies new onset of: myalgias, arthralgias, increased fatigue more than normal, chest pains, exercise intolerance, shortness of breath, dizziness, visual changes, headache, lower extremity swelling or claudication.   Most recent cholesterol panel was:  Lab Results  Component  Value Date   CHOL 130 06/18/2019   HDL 36 (L) 06/18/2019   LDLCALC 73 06/18/2019   LDLDIRECT 73 06/18/2019   TRIG 117 06/18/2019   CHOLHDL 3.6 06/18/2019   Hepatic Function Latest Ref Rng & Units 06/18/2019 07/01/2018 09/03/2017  Total Protein 6.0 - 8.5 g/dL 7.1 6.7 6.8  Albumin 3.8 - 4.8 g/dL 4.5 4.3 4.4  AST 0 - 40 IU/L '17 18 20  ' ALT 0 - 44 IU/L '25 31 30  ' Alk Phosphatase 39 - 117 IU/L 76 71 55  Total Bilirubin 0.0 - 1.2 mg/dL 0.6 0.7 0.8  Bilirubin, Direct  0.0 - 0.3 mg/dL - 0.1 0.1       Patient Care Team    Relationship Specialty Notifications Start End  Tyler Wolf D, NP PCP - General Family Medicine  08/27/18      Lab Results  Component Value Date   CREATININE 0.69 (L) 06/18/2019   BUN 16 06/18/2019   NA 139 06/18/2019   K 4.5 06/18/2019   CL 97 06/18/2019   CO2 29 06/18/2019    Lab Results  Component Value Date   CHOL 130 06/18/2019   CHOL 122 07/01/2018   CHOL 127 09/03/2017    Lab Results  Component Value Date   HDL 36 (L) 06/18/2019   HDL 28.30 (L) 07/01/2018   HDL 32.80 (L) 09/03/2017    Lab Results  Component Value Date   LDLCALC 73 06/18/2019   LDLCALC 63 07/01/2018   LDLCALC 63 09/03/2017    Lab Results  Component Value Date   TRIG 117 06/18/2019   TRIG 152.0 (H) 07/01/2018   TRIG 159.0 (H) 09/03/2017    Lab Results  Component Value Date   CHOLHDL 3.6 06/18/2019   CHOLHDL 4 07/01/2018   CHOLHDL 4 09/03/2017    Lab Results  Component Value Date   LDLDIRECT 73 06/18/2019   LDLDIRECT 160.7 11/27/2007   LDLDIRECT 117.9 05/20/2007   ===================================================================   Patient Active Problem List   Diagnosis Date Noted  . Poorly controlled diabetes mellitus ([place])- mgt per Endo; Dr Tyler Wolf 12/26/2019  . Mixed diabetic hyperlipidemia associated with type 2 diabetes mellitus ([place]) 12/26/2019  . Healthcare maintenance 08/27/2018  . Popliteal cyst, left 07/03/2017  . Diabetes ([place]) 06/04/2014  .  HYPERLIPIDEMIA, MIXED 05/27/2007  . 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 Hypertension associated with diabetes ([place]) 05/17/2007     Past Medical History:  Diagnosis Date  . DIABETES MELLITUS, TYPE II 05/17/2007  . DIVERTICULITIS, ACUTE 08/17/2010  . 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 HYPERLIPIDEMIA, MIXED 05/27/2007  . HYPERTENSION 05/17/2007  . TOBACCO ABUSE 05/27/2007     History reviewed. No pertinent surgical history.   Family History  Problem Relation Age of Onset  . Diabetes Mother   . Heart attack Father   . Diabetes Father      Social History   Substance and Sexual Activity  Drug Use No  ,  Social History   Substance and Sexual Activity  Alcohol Use No  . Alcohol/week: 0.0 standard drinks  ,  Social History   Tobacco Use  Smoking Status Current Every Day Smoker  . Packs/day: 0.50  . Years: 40.00  . Pack years: 20.00  Smokeless Tobacco Never Used  Tobacco Comment   electronic cigarettes, 6/1/2020pt states on and off  ,    Current Outpatient Medications on File Prior to Visit  Medication Sig Dispense Refill  . aspirin 81 MG tablet Take 81 mg by mouth daily.      Marland Kitchen atenolol-chlorthalidone (TENORETIC) 50-25 MG tablet TAKE 1 TABLET BY MOUTH DAILY 90 tablet 0  . atorvastatin (LIPITOR) 80 MG tablet TAKE 1 TABLET (80 MG TOTAL) BY MOUTH NIGHTLY 90 tablet 0  . glucose blood (CONTOUR NEXT TEST) test strip 1 each by Other route 2 (two) times daily. And lancets 2/day 200 each 3  . insulin NPH Human (HUMULIN N) 100 UNIT/ML injection Inject 0.6 mLs (60 Units total) into the skin daily. 20 mL 11  . Insulin Syringe-Needle U-100 (INSULIN SYRINGE 1CC/30GX5/16") 30G X 5/16" 1 ML MISC 1 Syringe by Does not apply  route daily. Use to inject insulin daily 100 each 0  . lisinopril (ZESTRIL) 5 MG tablet TAKE 1 TABLET (5 MG TOTAL) BY MOUTH DAILY. 90 tablet 0  . metFORMIN (GLUCOPHAGE) 1000 MG tablet TAKE 1 TABLET BY MOUTH TWICE A DAY 180 tablet 4   No current  facility-administered medications on file prior to visit.     Allergies  Allergen Reactions  . Latex     REACTION: Rash     Review of Systems:   General:  Denies fever, chills Optho/Auditory:   Denies visual changes, blurred vision Respiratory:   Denies SOB, cough, wheeze, DIB  Cardiovascular:   Denies chest pain, palpitations, painful respirations Gastrointestinal:   Denies nausea, vomiting, diarrhea.  Endocrine:     Denies new hot or cold intolerance Musculoskeletal:  Denies joint swelling, gait issues, or new unexplained myalgias/ arthralgias Skin:  Denies rash, suspicious lesions  Neurological:    Denies dizziness, unexplained weakness, numbness  Psychiatric/Behavioral:   Denies mood changes  Objective:    Blood pressure 121/63, pulse (!) 58, temperature 98 F (36.7 C), temperature source Oral, height '5\' 7"'  (1.702 m), weight 178 lb 6.4 oz (80.9 kg), SpO2 99 %.  Body mass index is 27.94 kg/m.  General: Well Developed, well nourished, and in no acute distress.  HEENT: Normocephalic, atraumatic, pupils equal round reactive to light, neck supple, No carotid bruits, no JVD Skin: Warm and dry, cap RF less 2 sec Cardiac: Regular rate and rhythm, S1, S2 WNL's, no murmurs rubs or gallops Respiratory: ECTA B/L, Not using accessory muscles, speaking in full sentences. NeuroM-Sk: Ambulates w/o assistance, moves ext * 4 w/o difficulty, sensation grossly intact.  Ext: scant edema b/l lower ext Psych: No HI/SI, judgement and insight good, Euthymic mood. Full Affect.

## 2019-12-26 NOTE — Patient Instructions (Addendum)
Please call your medical insurance and let them know that your primary care physician states that you should be screened for lung cancer with a low dose CT scan.  When you call your insurance, please tell them this, and see what your co-pay for the CT scan would be.    Diabetes Mellitus and Standards of Medical Care  Managing diabetes (diabetes mellitus) can be complicated. Your diabetes treatment may be managed by a team of health care providers, including:  A diet and nutrition specialist (registered dietitian).  A nurse.  A certified diabetes educator (CDE).  A diabetes specialist (endocrinologist).  An eye doctor.  A primary care provider.  A dentist.  Your health care providers follow a schedule in order to help you get the best quality of care. The following schedule is a general guideline for your diabetes management plan. Your health care providers may also give you more specific instructions.  HbA1c (hemoglobin A1c) test This test provides information about blood sugar (glucose) control over the previous 2-3 months. It is used to check whether your diabetes management plan needs to be adjusted.  If you are meeting your treatment goals, this test is done at least 2 times a year.  If you are not meeting treatment goals or if your treatment goals have changed, this test is done 4 times a year.  Blood pressure test  This test is done at every routine medical visit. For most people, the goal is less than 130/80. Ask your health care provider what your goal blood pressure should be.  Dental and eye exams  Visit your dentist two times a year.  If you have type 1 diabetes, get an eye exam 3-5 years after you are diagnosed, and then once a year after your first exam. ? If you were diagnosed with type 1 diabetes as a child, get an eye exam when you are age 50 or older and have had diabetes for 3-5 years. After the first exam, you should get an eye exam once a year.  If you  have type 2 diabetes, have an eye exam as soon as you are diagnosed, and then once a year after your first exam.  Foot care exam  Visual foot exams are done at every routine medical visit. The exams check for cuts, bruises, redness, blisters, sores, or other problems with the feet.  A complete foot exam is done by your health care provider once a year. This exam includes an inspection of the structure and skin of your feet, and a check of the pulses and sensation in your feet. ? Type 1 diabetes: Get your first exam 3-5 years after diagnosis. ? Type 2 diabetes: Get your first exam as soon as you are diagnosed.  Check your feet every day for cuts, bruises, redness, blisters, or sores. If you have any of these or other problems that are not healing, contact your health care provider.  Kidney function test (urine microalbumin)  This test is done once a year. ? Type 1 diabetes: Get your first test 5 years after diagnosis. ? Type 2 diabetes: Get your first test as soon as you are diagnosed._  If you have chronic kidney disease (CKD), get a serum creatinine and estimated glomerular filtration rate (eGFR) test once a year.  Lipid profile (cholesterol, HDL, LDL, triglycerides)  This test should be done when you are diagnosed with diabetes, and every 5 years after the first test. If you are on medicines to lower your cholesterol,  you may need to get this test done every year. ? The goal for LDL is less than 100 mg/dL (5.5 mmol/L). If you are at high risk, the goal is less than 70 mg/dL (3.9 mmol/L). ? The goal for HDL is 40 mg/dL (2.2 mmol/L) for men and 50 mg/dL(2.8 mmol/L) for women. An HDL cholesterol of 60 mg/dL (3.3 mmol/L) or higher gives some protection against heart disease. ? The goal for triglycerides is less than 150 mg/dL (8.3 mmol/L).  Immunizations  The yearly flu (influenza) vaccine is recommended for everyone 6 months or older who has diabetes.  The pneumonia (pneumococcal)  vaccine is recommended for everyone 2 years or older who has diabetes. If you are 64 or older, you may get the pneumonia vaccine as a series of two separate shots.  The hepatitis B vaccine is recommended for adults shortly after they have been diagnosed with diabetes.  The Tdap (tetanus, diphtheria, and pertussis) vaccine should be given: ? According to normal childhood vaccination schedules, for children. ? Every 10 years, for adults who have diabetes.  The shingles vaccine is recommended for people who have had chicken pox and are 50 years or older.  Mental and emotional health  Screening for symptoms of eating disorders, anxiety, and depression is recommended at the time of diagnosis and afterward as needed. If your screening shows that you have symptoms (you have a positive screening result), you may need further evaluation and be referred to a mental health care provider.  Diabetes self-management education  Education about how to manage your diabetes is recommended at diagnosis and ongoing as needed.  Treatment plan  Your treatment plan will be reviewed at every medical visit.  Summary  Managing diabetes (diabetes mellitus) can be complicated. Your diabetes treatment may be managed by a team of health care providers.  Your health care providers follow a schedule in order to help you get the best quality of care.  Standards of care including having regular physical exams, blood tests, blood pressure monitoring, immunizations, screening tests, and education about how to manage your diabetes.  Your health care providers may also give you more specific instructions based on your individual health.      Type 2 Diabetes Mellitus, Self Care, Adult Caring for yourself after you have been diagnosed with type 2 diabetes (type 2 diabetes mellitus) means keeping your blood sugar (glucose) under control with a balance of:  Nutrition.  Exercise.  Lifestyle changes.  Medicines or  insulin, if necessary.  Support from your team of health care providers and others.  The following information explains what you need to know to manage your diabetes at home. What do I need to do to manage my blood glucose?  Check your blood glucose every day, as often as told by your health care provider.  Contact your health care provider if your blood glucose is above your target for 2 tests in a row.  Have your A1c (hemoglobin A1c) level checked at least two times a year, or as often as told by your health care provider. Your health care provider will set individualized treatment goals for you. Generally, the goal of treatment is to maintain the following blood glucose levels:  Before meals (preprandial): 80-130 mg/dL (4.4-7.2 mmol/L).  After meals (postprandial): below 180 mg/dL (10 mmol/L).  A1c level: less than 7%.  What do I need to know about hyperglycemia and hypoglycemia? What is hyperglycemia? Hyperglycemia, also called high blood glucose, occurs when blood glucose is too  high.Make sure you know the early signs of hyperglycemia, such as:  Increased thirst.  Hunger.  Feeling very tired.  Needing to urinate more often than usual.  Blurry vision.  What is hypoglycemia? Hypoglycemia, also called low blood glucose, occurswith a blood glucose level at or below 70 mg/dL (3.9 mmol/L). The risk for hypoglycemia increases during or after exercise, during sleep, during illness, and when skipping meals or not eating for a long time (fasting). It is important to know the symptoms of hypoglycemia and treat it right away. Always have a 15-gram rapid-acting carbohydrate snack with you to treat low blood glucose. Family members and close friends should also know the symptoms and should understand how to treat hypoglycemia, in case you are not able to treat yourself. What are the symptoms of hypoglycemia? Hypoglycemia symptoms can include:  Hunger.  Anxiety.  Sweating and  feeling clammy.  Confusion.  Dizziness or feeling light-headed.  Sleepiness.  Nausea.  Increased heart rate.  Headache.  Blurry vision.  Seizure.  Nightmares.  Tingling or numbness around the mouth, lips, or tongue.  A change in speech.  Decreased ability to concentrate.  A change in coordination.  Restless sleep.  Tremors or shakes.  Fainting.  Irritability.  How do I treat hypoglycemia?  If you are alert and able to swallow safely, follow the 15:15 rule:  Take 15 grams of a rapid-acting carbohydrate. Rapid-acting options include: ? 1 tube of glucose gel. ? 3 glucose pills. ? 6-8 pieces of hard candy. ? 4 oz (120 mL) of fruit juice. ? 4 oz (120 mL) of regular (not diet) soda.  Check your blood glucose 15 minutes after you take the carbohydrate.  If the repeat blood glucose level is still at or below 70 mg/dL (3.9 mmol/L), take 15 grams of a carbohydrate again.  If your blood glucose level does not increase above 70 mg/dL (3.9 mmol/L) after 3 tries, seek emergency medical care.  After your blood glucose level returns to normal, eat a meal or a snack within 1 hour.  How do I treat severe hypoglycemia? Severe hypoglycemia is when your blood glucose level is at or below 54 mg/dL (3 mmol/L). Severe hypoglycemia is an emergency. Do not wait to see if the symptoms will go away. Get medical help right away. Call your local emergency services (911 in the [place] Do not drive yourself to the hospital. If you have severe hypoglycemia and you cannot eat or drink, you may need an injection of glucagon. A family member or close friend should learn how to check your blood glucose and how to give you a glucagon injection. Ask your health care provider if you need to have an emergency glucagon injection kit available. Severe hypoglycemia may need to be treated in a hospital. The treatment may include getting glucose through an IV tube. You may also need treatment for the  cause of your hypoglycemia. Can having diabetes put me at risk for other conditions? Having diabetes can put you at risk for other long-term (chronic) conditions, such as heart disease and kidney disease. Your health care provider may prescribe medicines to help prevent complications from diabetes. These medicines may include:  Aspirin.  Medicine to lower cholesterol.  Medicine to control blood pressure.  What else can I do to manage my diabetes? Take your diabetes medicines as told  If your health care provider prescribed insulin or diabetes medicines, take them every day.  Do not run out of insulin or other diabetes medicines  that you take. Plan ahead so you always have these available.  If you use insulin, adjust your dosage based on how physically active you are and what foods you eat. Your health care provider will tell you how to adjust your dosage. Make healthy food choices  The things that you eat and drink affect your blood glucose and your insulin dosage. Making good choices helps to control your diabetes and prevent other health problems. A healthy meal plan includes eating lean proteins, complex carbohydrates, fresh fruits and vegetables, low-fat dairy products, and healthy fats. Make an appointment to see a diet and nutrition specialist (registered dietitian) to help you create an eating plan that is right for you. Make sure that you:  Follow instructions from your health care provider about eating or drinking restrictions.  Drink enough fluid to keep your urine clear or pale yellow.  Eat healthy snacks between nutritious meals.  Track the carbohydrates that you eat. Do this by reading food labels and learning the standard serving sizes of foods.  Follow your sick day plan whenever you cannot eat or drink as usual. Make this plan in advance with your health care provider.  Stay active  Exercise regularly, as told by your health care provider. This may  include:  Stretching and doing strength exercises, such as yoga or weightlifting, at least 2 times a week.  Doing at least 150 minutes of moderate-intensity or vigorous-intensity exercise each week. This could be brisk walking, biking, or water aerobics. ? Spread out your activity over at least 3 days of the week. ? Do not go more than 2 days in a row without doing some kind of physical activity.  When you start a new exercise or activity, work with your health care provider to adjust your insulin, medicines, or food intake as needed. Make healthy lifestyle choices  Do not use any tobacco products, such as cigarettes, chewing tobacco, and e-cigarettes. If you need help quitting, ask your health care provider.  If your health care provider says that alcohol is safe for you, limit alcohol intake to no more than 1 drink per day for nonpregnant women and 2 drinks per day for men. One drink equals 12 oz of beer, 5 oz of wine, or 1 oz of hard liquor.  Learn to manage stress. If you need help with this, ask your health care provider. Care for your body   Keep your immunizations up to date. In addition to getting vaccinations as told by your health care provider, it is recommended that you get vaccinated against the following illnesses: ? The flu (influenza). Get a flu shot every year. ? Pneumonia. ? Hepatitis B.  Schedule an eye exam soon after your diagnosis, and then one time every year after that.  Check your skin and feet every day for cuts, bruises, redness, blisters, or sores. Schedule a foot exam with your health care provider once every year.  Brush your teeth and gums two times a day, and floss at least one time a day. Visit your dentist at least once every 6 months.  Maintain a healthy weight. General instructions  Take over-the-counter and prescription medicines only as told by your health care provider.  Share your diabetes management plan with people in your workplace,  school, and household.  Check your urine for ketones when you are ill and as told by your health care provider.  Ask your health care provider: ? Do I need to meet with a diabetes educator? ?  Where can I find a support group for people with diabetes?  Carry a medical alert card or wear medical alert jewelry.  Keep all follow-up visits as told by your health care provider. This is important. Where to find more information: For more information about diabetes, visit:  American Diabetes Association (ADA): www.diabetes.org  American Association of Diabetes Educators (AADE): www.diabeteseducator.org/patient-resources  This information is not intended to replace advice given to you by your health care provider. Make sure you discuss any questions you have with your health care provider. Document Released: 01/24/2016 Document Revised: 03/09/2016 Document Reviewed: 11/05/2015 Elsevier Interactive Patient Education  2017 [place].      Blood Glucose Monitoring, Adult Monitoring your blood sugar (glucose) helps you manage your diabetes. It also helps you and your health care provider determine how well your diabetes management plan is working. Blood glucose monitoring involves checking your blood glucose as often as directed, and keeping a record (log) of your results over time. Why should I monitor my blood glucose? Checking your blood glucose regularly can:  Help you understand how food, exercise, illnesses, and medicines affect your blood glucose.  Let you know what your blood glucose is at any time. You can quickly tell if you are having low blood glucose (hypoglycemia) or high blood glucose (hyperglycemia).  Help you and your health care provider adjust your medicines as needed.  When should I check my blood glucose? Follow instructions from your health care provider about how often to check your blood glucose.   This may depend on:  The type of diabetes you have.  How  well-controlled your diabetes is.  Medicines you are taking.  If you have type 1 diabetes:  Check your blood glucose at least 2 times a day.  Also check your blood glucose: ? Before every insulin injection. ? Before and after exercise. ? Between meals. ? 2 hours after a meal. ? Occasionally between 2:00 a.m. and 3:00 a.m., as directed. ? Before potentially dangerous tasks, like driving or using heavy machinery. ? At bedtime.  You may need to check your blood glucose more often, up to 6-10 times a day: ? If you use an insulin pump. ? If you need multiple daily injections (MDI). ? If your diabetes is not well-controlled. ? If you are ill. ? If you have a history of severe hypoglycemia. ? If you have a history of not knowing when your blood glucose is getting low (hypoglycemia unawareness).  If you have type 2 diabetes:  If you take insulin or other diabetes medicines, check your blood glucose at least 2 times a day.  If you are on intensive insulin therapy, check your blood glucose at least 4 times a day. Occasionally, you may also need to check between 2:00 a.m. and 3:00 a.m., as directed.  Also check your blood glucose: ? Before and after exercise. ? Before potentially dangerous tasks, like driving or using heavy machinery.  You may need to check your blood glucose more often if: ? Your medicine is being adjusted. ? Your diabetes is not well-controlled. ? You are ill.  What is a blood glucose log?  A blood glucose log is a record of your blood glucose readings. It helps you and your health care provider: ? Look for patterns in your blood glucose over time. ? Adjust your diabetes management plan as needed.  Every time you check your blood glucose, write down your result and notes about things that may be affecting your blood  glucose, such as your diet and exercise for the day.  Most glucose meters store a record of glucose readings in the meter. Some meters allow you to  download your records to a computer. How do I check my blood glucose? Follow these steps to get accurate readings of your blood glucose: Supplies needed   Blood glucose meter.  Test strips for your meter. Each meter has its own strips. You must use the strips that come with your meter.  A needle to prick your finger (lancet). Do not use lancets more than once.  A device that holds the lancet (lancing device).  A journal or log book to write down your results.  Procedure  Wash your hands with soap and water.  Prick the side of your finger (not the tip) with the lancet. Use a different finger each time.  Gently rub the finger until a small drop of blood appears.  Follow instructions that come with your meter for inserting the test strip, applying blood to the strip, and using your blood glucose meter.  Write down your result and any notes.  Alternative testing sites  Some meters allow you to use areas of your body other than your finger (alternative sites) to test your blood.  If you think you may have hypoglycemia, or if you have hypoglycemia unawareness, do not use alternative sites. Use your finger instead.  Alternative sites may not be as accurate as the fingers, because blood flow is slower in these areas. This means that the result you get may be delayed, and it may be different from the result that you would get from your finger.  The most common alternative sites are: ? Forearm. ? Thigh. ? Palm of the hand.  Additional tips  Always keep your supplies with you.  If you have questions or need help, all blood glucose meters have a 24-hour "hotline" number that you can call. You may also contact your health care provider.  After you use a few boxes of test strips, adjust (calibrate) your blood glucose meter by following instructions that came with your meter.    The American Diabetes Association suggests the following targets for most nonpregnant adults with  diabetes.  More or less stringent glycemic goals may be appropriate for each individual.  A1C: Less than 7% A1C may also be reported as eAG: Less than 154 mg/dl Before a meal (preprandial plasma glucose): 80-130 mg/dl 1-2 hours after beginning of the meal (Postprandial plasma glucose)*: Less than 180 mg/dl  *Postprandial glucose may be targeted if A1C goals are not met despite reaching preprandial glucose goals.   GOALS in short:  The goals are for the Hgb A1C to be less than 7.0 & blood pressure to be less than 130/80.    It is recommended that all diabetics are educated on and follow a healthy diabetic diet, exercise for 30 minutes 3-4 times per week (walking, biking, swimming, or machine), monitor blood glucose readings and bring that record with you to be reviewed at your next office visit.     You should be checking fasting blood sugars- especially after you eat poorly or eat really healthy, and also check 2 hour postprandial blood sugars after largest meal of the day.    Write these down and bring in your log at each office visit.    You will need to be seen every 3 months by the provider managing your Diabetes unless told otherwise by that provider.   You will need  yearly eye exams from an eye specialist and foot exams to check the nerves of your feet.  Also, your urine should be checked yearly as well to make sure excess protein is not present.   If you are checking your blood pressure at home, please record it and bring it to your next office visit.    Follow the Dietary Approaches to Stop Hypertension (DASH) diet (3 servings of fruit and vegetables daily, whole grains, low sodium, low-fat proteins).  See below.    Lastly, when it comes to your cholesterol, the goal is to have the HDL (good cholesterol) >40, and the LDL (bad cholesterol) <100.   It is recommended that you follow a heart healthy, low saturated and trans-fat diet and exercise for 30 minutes at least 5 times a week.      (( Check out the DASH diet = 1.5 Gram Low Sodium Diet   A 1.5 gram sodium diet restricts the amount of sodium in the diet to no more than 1.5 g or 1500 mg daily.  The American Heart Association recommends Americans over the age of 8 to consume no more than 1500 mg of sodium each day to reduce the risk of developing high blood pressure.  Research also shows that limiting sodium may reduce heart attack and stroke risk.  Many foods contain sodium for flavor and sometimes as a preservative.  When the amount of sodium in a diet needs to be low, it is important to know what to look for when choosing foods and drinks.  The following includes some information and guidelines to help make it easier for you to adapt to a low sodium diet.    QUICK TIPS  Do not add salt to food.  Avoid convenience items and fast food.  Choose unsalted snack foods.  Buy lower sodium products, often labeled as "lower sodium" or "no salt added."  Check food labels to learn how much sodium is in 1 serving.  When eating at a restaurant, ask that your food be prepared with less salt or none, if possible.    READING FOOD LABELS FOR SODIUM INFORMATION  The nutrition facts label is a good place to find how much sodium is in foods. Look for products with no more than 400 mg of sodium per serving.  Remember that 1.5 g = 1500 mg.  The food label may also list foods as:  Sodium-free: Less than 5 mg in a serving.  Very low sodium: 35 mg or less in a serving.  Low-sodium: 140 mg or less in a serving.  Light in sodium: 50% less sodium in a serving. For example, if a food that usually has 300 mg of sodium is changed to become light in sodium, it will have 150 mg of sodium.  Reduced sodium: 25% less sodium in a serving. For example, if a food that usually has 400 mg of sodium is changed to reduced sodium, it will have 300 mg of sodium.    CHOOSING FOODS  Grains  Avoid: Salted crackers and snack items. Some cereals, including  instant hot cereals. Bread stuffing and biscuit mixes. Seasoned rice or pasta mixes.  Choose: Unsalted snack items. Low-sodium cereals, oats, puffed wheat and rice, shredded wheat. English muffins and bread. Pasta.  Meats  Avoid: Salted, canned, smoked, spiced, pickled meats, including fish and poultry. Bacon, ham, sausage, cold cuts, hot dogs, anchovies.  Choose: Low-sodium canned tuna and salmon. Fresh or frozen meat, poultry, and fish.  Dairy  Avoid: Processed cheese and spreads. Cottage cheese. Buttermilk and condensed milk. Regular cheese.  Choose: Milk. Low-sodium cottage cheese. Yogurt. Sour cream. Low-sodium cheese.  Fruits and Vegetables  Avoid: Regular canned vegetables. Regular canned tomato sauce and paste. Frozen vegetables in sauces. Olives. Angie Fava. Relishes. Sauerkraut.  Choose: Low-sodium canned vegetables. Low-sodium tomato sauce and paste. Frozen or fresh vegetables. Fresh and frozen fruit.  Condiments  Avoid: Canned and packaged gravies. Worcestershire sauce. Tartar sauce. Barbecue sauce. Soy sauce. Steak sauce. Ketchup. Onion, garlic, and table salt. Meat flavorings and tenderizers.  Choose: Fresh and dried herbs and spices. Low-sodium varieties of mustard and ketchup. Lemon juice. Tabasco sauce. Horseradish.    SAMPLE 1.5 GRAM SODIUM MEAL PLAN:   Breakfast / Sodium (mg)  1 cup low-fat milk / 143 mg  1 whole-wheat English muffin / 240 mg  1 tbs heart-healthy margarine / 153 mg  1 hard-boiled egg / 139 mg  1 small orange / 0 mg  Lunch / Sodium (mg)  1 cup raw carrots / 76 mg  2 tbs no salt added peanut butter / 5 mg  2 slices whole-wheat bread / 270 mg  1 tbs jelly / 6 mg   cup red grapes / 2 mg  Dinner / Sodium (mg)  1 cup whole-wheat pasta / 2 mg  1 cup low-sodium tomato sauce / 73 mg  3 oz lean ground beef / 57 mg  1 small side salad (1 cup raw spinach leaves,  cup cucumber,  cup yellow bell pepper) with 1 tsp olive oil and 1 tsp red wine vinegar / 25 mg   Snack / Sodium (mg)  1 container low-fat vanilla yogurt / 107 mg  3 graham cracker squares / 127 mg  Nutrient Analysis  Calories: 1745  Protein: 75 g  Carbohydrate: 237 g  Fat: 57 g  Sodium: 1425 mg  Document Released: 10/02/2005 Document Revised: 06/14/2011 Document Reviewed: 01/03/2010  ExitCare Patient Information 2012 [place].))    This information is not intended to replace advice given to you by your health care provider. Make sure you discuss any questions you have with your health care provider. Document Released: 10/05/2003 Document Revised: 04/21/2016 Document Reviewed: 03/13/2016 Elsevier Interactive Patient Education  2017 [employer].

## 2020-01-02 ENCOUNTER — Other Ambulatory Visit: Payer: Self-pay

## 2020-01-06 ENCOUNTER — Other Ambulatory Visit: Payer: Self-pay

## 2020-01-06 ENCOUNTER — Ambulatory Visit: Payer: BC Managed Care – PPO | Admitting: Endocrinology

## 2020-01-06 ENCOUNTER — Encounter: Payer: Self-pay | Admitting: Endocrinology

## 2020-01-06 VITALS — BP 114/70 | HR 64 | Ht 67.0 in | Wt 176.0 lb

## 2020-01-06 DIAGNOSIS — Z794 Long term (current) use of insulin: Secondary | ICD-10-CM | POA: Diagnosis not present

## 2020-01-06 DIAGNOSIS — E119 Type 2 diabetes mellitus without complications: Secondary | ICD-10-CM

## 2020-01-06 LAB — POCT GLYCOSYLATED HEMOGLOBIN (HGB A1C): Hemoglobin A1C: 8.4 % — AB (ref 4.0–5.6)

## 2020-01-06 MED ORDER — INSULIN NPH (HUMAN) (ISOPHANE) 100 UNIT/ML ~~LOC~~ SUSP: 65.0000 [IU] | SUBCUTANEOUS | 11 refills | Status: DC

## 2020-01-06 NOTE — Patient Instructions (Addendum)
Please increase the insulin to 65 units each morning.   On this type of insulin schedule, you should eat meals on a regular schedule.  If a meal is missed or significantly delayed, your blood sugar could go low.   If it is good, please come back for a follow-up appointment in 2 months.   check your blood sugar twice a day.  vary the time of day when you check, between before the 3 meals, and at bedtime.  also check if you have symptoms of your blood sugar being too high or too low.  please keep a record of the readings and bring it to your next appointment here (or you can bring the meter itself).  You can write it on any piece of paper.  please call us sooner if your blood sugar goes below 70, or if you have a lot of readings over 200.

## 2020-01-06 NOTE — Progress Notes (Signed)
Subjective:    Patient ID: Tyler Wolf, male    DOB: [birth year], 64 y.o.   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   DKA: never Severe hypoglycemia: never Pancreatitis: never Pancreatic imaging: normal on 2007 CT SDOH: due to missing insulin, he is not a candidate for multiple daily injections.   Other: he changed Lantus to NPH, due to pattern of cbg's.   Interval history: Pt says he rarely misses the insulin.  no cbg record, but states cbg varies from 110-172.  It is in general higher as the day goes on.  No recent steroids.  pt states he feels well in general.   Past Medical History:  Diagnosis Date  . DIABETES MELLITUS, TYPE II 05/17/2007  . DIVERTICULITIS, ACUTE 08/17/2010  . 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 HYPERLIPIDEMIA, MIXED 05/27/2007  . HYPERTENSION 05/17/2007  . TOBACCO ABUSE 05/27/2007    No past surgical history on file.  Social History   Socioeconomic History  . Marital status: Married    Spouse name: Not on file  . Number of children: Not on file  . Years of education: Not on file  . Highest education level: Not on file  Occupational History  . Not on file  Tobacco Use  . Smoking status: Current Every Day Smoker    Packs/day: 0.50    Years: 40.00    Pack years: 20.00  . Smokeless tobacco: Never Used  . Tobacco comment: electronic cigarettes, 6/1/2020pt states on and off  Substance and Sexual Activity  . Alcohol use: No    Alcohol/week: 0.0 standard drinks  . Drug use: No  . Sexual activity: Not Currently    Birth control/protection: None  Other Topics Concern  . Not on file  Social History Narrative  . Not on file   Social Determinants of Health   Financial Resource Strain:   . Difficulty of Paying Living Expenses:   Food Insecurity:   . Worried  About Charity fundraiser in the Last Year:   . Arboriculturist in the Last Year:   Transportation Needs:   . Film/video editor (Medical):   Marland Kitchen Lack of Transportation (Non-Medical):   Physical Activity:   . Days of Exercise per Week:   . Minutes of Exercise per Session:   Stress:   . Feeling of Stress :   Social Connections:   . Frequency of Communication with Friends and Family:   . Frequency of Social Gatherings with Friends and Family:   . Attends Religious Services:   . Active Member of Clubs or Organizations:   . Attends Archivist Meetings:   Marland Kitchen Marital Status:   Intimate Partner Violence:   . Fear of Current or Ex-Partner:   . Emotionally Abused:   Marland Kitchen Physically Abused:   . Sexually Abused:     Current Outpatient Medications on File Prior to Visit  Medication Sig Dispense Refill  . aspirin 81 MG tablet Take 81 mg by mouth daily.      Marland Kitchen atenolol-chlorthalidone (TENORETIC) 50-25 MG tablet TAKE 1 TABLET BY MOUTH DAILY 90 tablet 0  . atorvastatin (LIPITOR) 80 MG tablet TAKE 1 TABLET (80 MG TOTAL) BY MOUTH NIGHTLY 90 tablet 0  . glucose blood (CONTOUR NEXT TEST) test strip 1 each by Other route 2 (two)  times daily. And lancets 2/day 200 each 3  . Insulin Syringe-Needle U-100 (INSULIN SYRINGE 1CC/30GX5/16") 30G X 5/16" 1 ML MISC 1 Syringe by Does not apply route daily. Use to inject insulin daily 100 each 0  . lisinopril (ZESTRIL) 5 MG tablet TAKE 1 TABLET (5 MG TOTAL) BY MOUTH DAILY. 90 tablet 0  . metFORMIN (GLUCOPHAGE) 1000 MG tablet TAKE 1 TABLET BY MOUTH TWICE A DAY 180 tablet 4   No current facility-administered medications on file prior to visit.    Allergies  Allergen Reactions  . Latex     REACTION: Rash    Family History  Problem Relation Age of Onset  . Diabetes Mother   . Heart attack Father   . Diabetes Father     BP 114/70   Pulse 64   Ht 5\' 7"  (1.702 m)   Wt 176 lb (79.8 kg)   SpO2 96%   BMI 27.57 kg/m    Review of Systems He  denies hypoglycemia.      Objective:   Physical Exam VITAL SIGNS:  See vs page GENERAL: no distress Pulses: dorsalis pedis intact bilat.   MSK: no deformity of the feet CV: no leg edema Skin:  no ulcer on the feet.  normal color and temp on the feet. Neuro: sensation is intact to touch on the feet.  Ext: several ingrown toenails, but no erythema/swell/drainage.   Lab Results  Component Value Date   HGBA1C 8.4 (A) 01/06/2020       Assessment & Plan:  Insulin-requiring type 2 DM: he needs increased rx  Patient Instructions  Please increase the insulin to 65 units each morning.   On this type of insulin schedule, you should eat meals on a regular schedule.  If a meal is missed or significantly delayed, your blood sugar could go low.   If it is good, please come back for a follow-up appointment in 2 months.   check your blood sugar twice a day.  vary the time of day when you check, between before the 3 meals, and at bedtime.  also check if you have symptoms of your blood sugar being too high or too low.  please keep a record of the readings and bring it to your next appointment here (or you can bring the meter itself).  You can write it on any piece of paper.  please call us sooner if your blood sugar goes below 70, or if you have a lot of readings over 200.

## 2020-01-08 ENCOUNTER — Ambulatory Visit: Payer: BC Managed Care – PPO | Attending: Internal Medicine

## 2020-01-08 DIAGNOSIS — Z23 Encounter for immunization: Secondary | ICD-10-CM

## 2020-01-08 NOTE — Progress Notes (Signed)
   Covid-19 Vaccination Clinic  Name:  MAVERYCK ALLY    MRN: [id number] DOB: September 30, 1956  01/08/2020  Mr. Reasner was observed post Covid-19 immunization for 15 minutes without incident. He was provided with Vaccine Information Sheet and instruction to access the V-Safe system.   Mr. Lammie was instructed to call 911 with any severe reactions post vaccine: Marland Kitchen Difficulty breathing  . Swelling of face and throat  . A fast heartbeat  . A bad rash all over body  . Dizziness and weakness   Immunizations Administered    Name Date Dose VIS Date Route   Pfizer COVID-19 Vaccine 01/08/2020 11:14 AM 0.3 mL 09/26/2019 Intramuscular   Manufacturer: [place]   Lot: [id number]   [place]: [id number]

## 2020-01-29 ENCOUNTER — Other Ambulatory Visit: Payer: Self-pay | Admitting: Adult Health

## 2020-02-02 ENCOUNTER — Ambulatory Visit: Payer: BC Managed Care – PPO | Attending: Internal Medicine

## 2020-02-02 DIAGNOSIS — Z23 Encounter for immunization: Secondary | ICD-10-CM

## 2020-02-02 NOTE — Progress Notes (Signed)
   Covid-19 Vaccination Clinic  Name:  Tyler Wolf    MRN: [id number] DOB: 1956-09-26  02/02/2020  Tyler Wolf was observed post Covid-19 immunization for 15 minutes without incident. He was provided with Vaccine Information Sheet and instruction to access the V-Safe system.   Tyler Wolf was instructed to call 911 with any severe reactions post vaccine: Marland Kitchen Difficulty breathing  . Swelling of face and throat  . A fast heartbeat  . A bad rash all over body  . Dizziness and weakness   Immunizations Administered    Name Date Dose VIS Date Route   Pfizer COVID-19 Vaccine 02/02/2020 10:00 AM 0.3 mL 12/10/2018 Intramuscular   Manufacturer: [place]   Lot: [id number]   [place]: [id number]

## 2020-02-10 ENCOUNTER — Other Ambulatory Visit: Payer: Self-pay | Admitting: Physician Assistant

## 2020-02-10 DIAGNOSIS — I10 Essential (primary) hypertension: Secondary | ICD-10-CM

## 2020-02-12 ENCOUNTER — Other Ambulatory Visit: Payer: Self-pay

## 2020-02-12 ENCOUNTER — Ambulatory Visit (INDEPENDENT_AMBULATORY_CARE_PROVIDER_SITE_OTHER): Payer: BC Managed Care – PPO | Admitting: Family Medicine

## 2020-02-12 ENCOUNTER — Encounter: Payer: Self-pay | Admitting: Family Medicine

## 2020-02-12 VITALS — BP 134/84 | HR 62 | Temp 97.7°F | Ht 67.0 in | Wt 172.0 lb

## 2020-02-12 DIAGNOSIS — Z1211 Encounter for screening for malignant neoplasm of colon: Secondary | ICD-10-CM

## 2020-02-12 DIAGNOSIS — E1169 Type 2 diabetes mellitus with other specified complication: Secondary | ICD-10-CM | POA: Diagnosis not present

## 2020-02-12 DIAGNOSIS — Z0000 Encounter for general adult medical examination without abnormal findings: Secondary | ICD-10-CM

## 2020-02-12 DIAGNOSIS — I10 Essential (primary) hypertension: Secondary | ICD-10-CM

## 2020-02-12 DIAGNOSIS — I152 Hypertension secondary to endocrine disorders: Secondary | ICD-10-CM

## 2020-02-12 DIAGNOSIS — Z719 Counseling, unspecified: Secondary | ICD-10-CM

## 2020-02-12 DIAGNOSIS — Z794 Long term (current) use of insulin: Secondary | ICD-10-CM | POA: Diagnosis not present

## 2020-02-12 DIAGNOSIS — E559 Vitamin D deficiency, unspecified: Secondary | ICD-10-CM

## 2020-02-12 DIAGNOSIS — Z23 Encounter for immunization: Secondary | ICD-10-CM | POA: Diagnosis not present

## 2020-02-12 DIAGNOSIS — E1159 Type 2 diabetes mellitus with other circulatory complications: Secondary | ICD-10-CM

## 2020-02-12 MED ORDER — ATENOLOL-CHLORTHALIDONE 50-25 MG PO TABS: 1.0000 | ORAL_TABLET | Freq: Every day | ORAL | 0 refills | Status: DC

## 2020-02-12 MED ORDER — LISINOPRIL 5 MG PO TABS: ORAL_TABLET | ORAL | 0 refills | Status: DC

## 2020-02-12 NOTE — Patient Instructions (Addendum)
Repeat colonoscopy early December, 2021.      Preventive Care, Male Preventive care refers to lifestyle choices and visits with your health care provider that can promote health and wellness. What does preventive care include?   A yearly physical exam. This is also called an annual well check.  Dental exams once or twice a year.  Routine eye exams. Ask your health care provider how often you should have your eyes checked.  Personal lifestyle choices, including: ? Daily care of your teeth and gums. ? Regular physical activity. ? Eating a healthy diet. ? Avoiding tobacco and drug use. ? Limiting alcohol use. ? Practicing safe sex. ? Taking low doses of aspirin every day. ? Taking vitamin and mineral supplements as recommended by your health care provider. What happens during an annual well check? The services and screenings done by your health care provider during your annual well check will depend on your age, overall health, lifestyle risk factors, and family history of disease. Counseling Your health care provider may ask you questions about your:  Alcohol use.  Tobacco use.  Drug use.  Emotional well-being.  Home and relationship well-being.  Sexual activity.  Eating habits.  History of falls.  Memory and ability to understand (cognition).  Work and work Statistician. Screening You may have the following tests or measurements:  Height, weight, and BMI.  Blood pressure.  Lipid and cholesterol levels. These may be checked every 5 years, or more frequently if you are over 21 years old.  Skin check.  Lung cancer screening. You may have this screening every year starting at age 63 if you have a 30-pack-year history of smoking and currently smoke or have quit within the past 15 years.  Colorectal cancer screening. All adults should have this screening starting at age 77 and continuing until age 36. You will have tests every 1-10 years, depending on your  results and the type of screening test. People at increased risk should start screening at an earlier age. Screening tests may include: ? Guaiac-based fecal occult blood testing. ? Fecal immunochemical test (FIT). ? Stool DNA test. ? Virtual colonoscopy. ? Sigmoidoscopy. During this test, a flexible tube with a tiny camera (sigmoidoscope) is used to examine your rectum and lower colon. The sigmoidoscope is inserted through your anus into your rectum and lower colon. ? Colonoscopy. During this test, a long, thin, flexible tube with a tiny camera (colonoscope) is used to examine your entire colon and rectum.  Prostate cancer screening. Recommendations will vary depending on your family history and other risks.  Hepatitis C blood test.  Hepatitis B blood test.  Sexually transmitted disease (STD) testing.  Diabetes screening. This is done by checking your blood sugar (glucose) after you have not eaten for a while (fasting). You may have this done every 1-3 years.  Abdominal aortic aneurysm (AAA) screening. You may need this if you are a current or former smoker.  Osteoporosis. You may be screened starting at age 61 if you are at high risk. Talk with your health care provider about your test results, treatment options, and if necessary, the need for more tests. Vaccines Your health care provider may recommend certain vaccines, such as:  Influenza vaccine. This is recommended every year.  Tetanus, diphtheria, and acellular pertussis (Tdap, Td) vaccine. You may need a Td booster every 10 years.  Varicella vaccine. You may need this if you have not been vaccinated.  Zoster vaccine. You may need this after age 39.  Measles, mumps, and rubella (MMR) vaccine. You may need at least one dose of MMR if you were born in 1957 or later. You may also need a second dose.  Pneumococcal 13-valent conjugate (PCV13) vaccine. One dose is recommended after age 55.  Pneumococcal polysaccharide (PPSV23)  vaccine. One dose is recommended after age 70.  Meningococcal vaccine. You may need this if you have certain conditions.  Hepatitis A vaccine. You may need this if you have certain conditions or if you travel or work in places where you may be exposed to hepatitis A.  Hepatitis B vaccine. You may need this if you have certain conditions or if you travel or work in places where you may be exposed to hepatitis B.  Haemophilus influenzae type b (Hib) vaccine. You may need this if you have certain risk factors. Talk to your health care provider about which screenings and vaccines you need and how often you need them. This information is not intended to replace advice given to you by your health care provider. Make sure you discuss any questions you have with your health care provider. Document Released: 10/29/2015 Document Revised: 11/22/2017 Document Reviewed: 08/03/2015 Elsevier Interactive Patient Education  2019 [place] for Adults, Male A healthy lifestyle and preventive care can promote health and wellness. Preventive health guidelines for men include the following key practices:  A routine yearly physical is a good way to check with your health care provider about your health and preventative screening. It is a chance to share any concerns and updates on your health and to receive a thorough exam.  Visit your dentist for a routine exam and preventative care every 6 months. Brush your teeth twice a day and floss once a day. Good oral hygiene prevents tooth decay and gum disease.  The frequency of eye exams is based on your age, health, family medical history, use of contact lenses, and other factors. Follow your health care provider's recommendations for frequency of eye exams.  Eat a healthy diet. Foods such as vegetables, fruits, whole grains, low-fat dairy products, and lean protein foods contain the nutrients you need without too many calories. Decrease  your intake of foods high in solid fats, added sugars, and salt. Eat the right amount of calories for you. Get information about a proper diet from your health care provider, if necessary.  Regular physical exercise is one of the most important things you can do for your health. Most adults should get at least 150 minutes of moderate-intensity exercise (any activity that increases your heart rate and causes you to sweat) each week. In addition, most adults need muscle-strengthening exercises on 2 or more days a week.  Maintain a healthy weight. The body mass index (BMI) is a screening tool to identify possible weight problems. It provides an estimate of body fat based on height and weight. Your health care provider can find your BMI and can help you achieve or maintain a healthy weight. For adults 20 years and older:  A BMI below 18.5 is considered underweight.  A BMI of 18.5 to 24.9 is normal.  A BMI of 25 to 29.9 is considered overweight.  A BMI of 30 and above is considered obese.  Maintain normal blood lipids and cholesterol levels by exercising and minimizing your intake of saturated fat. Eat a balanced diet with plenty of fruit and vegetables. Blood tests for lipids and cholesterol should begin at age 61 and be  repeated every 5 years. If your lipid or cholesterol levels are high, you are over 50, or you are at high risk for heart disease, you may need your cholesterol levels checked more frequently. Ongoing high lipid and cholesterol levels should be treated with medicines if diet and exercise are not working.  If you smoke, find out from your health care provider how to quit. If you do not use tobacco, do not start.  Lung cancer screening is recommended for adults aged 59-80 years who are at high risk for developing lung cancer because of a history of smoking. A yearly low-dose CT scan of the lungs is recommended for people who have at least a 30-pack-year history of smoking and are a  current smoker or have quit within the past 15 years. A pack year of smoking is smoking an average of 1 pack of cigarettes a day for 1 year (for example: 1 pack a day for 30 years or 2 packs a day for 15 years). Yearly screening should continue until the smoker has stopped smoking for at least 15 years. Yearly screening should be stopped for people who develop a health problem that would prevent them from having lung cancer treatment.  If you choose to drink alcohol, do not have more than 2 drinks per day. One drink is considered to be 12 ounces (355 mL) of beer, 5 ounces (148 mL) of wine, or 1.5 ounces (44 mL) of liquor.  Avoid use of street drugs. Do not share needles with anyone. Ask for help if you need support or instructions about stopping the use of drugs.  High blood pressure causes heart disease and increases the risk of stroke. Your blood pressure should be checked at least every 1-2 years. Ongoing high blood pressure should be treated with medicines, if weight loss and exercise are not effective.  If you are 65-34 years old, ask your health care provider if you should take aspirin to prevent heart disease.  Diabetes screening is done by taking a blood sample to check your blood glucose level after you have not eaten for a certain period of time (fasting). If you are not overweight and you do not have risk factors for diabetes, you should be screened once every 3 years starting at age 4. If you are overweight or obese and you are 75-31 years of age, you should be screened for diabetes every year as part of your cardiovascular risk assessment.  Colorectal cancer can be detected and often prevented. Most routine colorectal cancer screening begins at the age of 58 and continues through age 72. However, your health care provider may recommend screening at an earlier age if you have risk factors for colon cancer. On a yearly basis, your health care provider may provide home test kits to check for  hidden blood in the stool. Use of a small camera at the end of a tube to directly examine the colon (sigmoidoscopy or colonoscopy) can detect the earliest forms of colorectal cancer. Talk to your health care provider about this at age 59, when routine screening begins. Direct exam of the colon should be repeated every 5-10 years through age 78, unless early forms of precancerous polyps or small growths are found.  People who are at an increased risk for hepatitis B should be screened for this virus. You are considered at high risk for hepatitis B if:  You were born in a country where hepatitis B occurs often. Talk with your health care provider about  which countries are considered high risk.  Your parents were born in a high-risk country and you have not received a shot to protect against hepatitis B (hepatitis B vaccine).  You have HIV or AIDS.  You use needles to inject street drugs.  You live with, or have sex with, someone who has hepatitis B.  You are a man who has sex with other men (MSM).  You get hemodialysis treatment.  You take certain medicines for conditions such as cancer, organ transplantation, and autoimmune conditions.  Hepatitis C blood testing is recommended for all people born from [birth year] through 1965 and any individual with known risks for hepatitis C.  Practice safe sex. Use condoms and avoid high-risk sexual practices to reduce the spread of sexually transmitted infections (STIs). STIs include gonorrhea, chlamydia, syphilis, trichomonas, herpes, HPV, and human immunodeficiency virus (HIV). Herpes, HIV, and HPV are viral illnesses that have no cure. They can result in disability, cancer, and death.  If you are a man who has sex with other men, you should be screened at least once per year for:  HIV.  Urethral, rectal, and pharyngeal infection of gonorrhea, chlamydia, or both.  If you are at risk of being infected with HIV, it is recommended that you take a  prescription medicine daily to prevent HIV infection. This is called preexposure prophylaxis (PrEP). You are considered at risk if:  You are a man who has sex with other men (MSM) and have other risk factors.  You are a heterosexual man, are sexually active, and are at increased risk for HIV infection.  You take drugs by injection.  You are sexually active with a partner who has HIV.  Talk with your health care provider about whether you are at high risk of being infected with HIV. If you choose to begin PrEP, you should first be tested for HIV. You should then be tested every 3 months for as long as you are taking PrEP.  A one-time screening for abdominal aortic aneurysm (AAA) and surgical repair of large AAAs by ultrasound are recommended for men ages 63 to 88 years who are current or former smokers.  Healthy men should no longer receive prostate-specific antigen (PSA) blood tests as part of routine cancer screening. Talk with your health care provider about prostate cancer screening.  Testicular cancer screening is not recommended for adult males who have no symptoms. Screening includes self-exam, a health care provider exam, and other screening tests. Consult with your health care provider about any symptoms you have or any concerns you have about testicular cancer.  Use sunscreen. Apply sunscreen liberally and repeatedly throughout the day. You should seek shade when your shadow is shorter than you. Protect yourself by wearing long sleeves, pants, a wide-brimmed hat, and sunglasses year round, whenever you are outdoors.  Once a month, do a whole-body skin exam, using a mirror to look at the skin on your back. Tell your health care provider about new moles, moles that have irregular borders, moles that are larger than a pencil eraser, or moles that have changed in shape or color.  Stay current with required vaccines (immunizations).  Influenza vaccine. All adults should be immunized every  year.  Tetanus, diphtheria, and acellular pertussis (Td, Tdap) vaccine. An adult who has not previously received Tdap or who does not know his vaccine status should receive 1 dose of Tdap. This initial dose should be followed by tetanus and diphtheria toxoids (Td) booster doses every 10 years. Adults with  an unknown or incomplete history of completing a 3-dose immunization series with Td-containing vaccines should begin or complete a primary immunization series including a Tdap dose. Adults should receive a Td booster every 10 years.  Varicella vaccine. An adult without evidence of immunity to varicella should receive 2 doses or a second dose if he has previously received 1 dose.  Human papillomavirus (HPV) vaccine. Males aged 11-21 years who have not received the vaccine previously should receive the 3-dose series. Males aged 22-26 years may be immunized. Immunization is recommended through the age of 76 years for any male who has sex with males and did not get any or all doses earlier. Immunization is recommended for any person with an immunocompromised condition through the age of 100 years if he did not get any or all doses earlier. During the 3-dose series, the second dose should be obtained 4-8 weeks after the first dose. The third dose should be obtained 24 weeks after the first dose and 16 weeks after the second dose.  Zoster vaccine. One dose is recommended for adults aged 41 years or older unless certain conditions are present.  Measles, mumps, and rubella (MMR) vaccine. Adults born before [birth year] generally are considered immune to measles and mumps. Adults born in [birth year] or later should have 1 or more doses of MMR vaccine unless there is a contraindication to the vaccine or there is laboratory evidence of immunity to each of the three diseases. A routine second dose of MMR vaccine should be obtained at least 28 days after the first dose for students attending postsecondary schools, health care  workers, or international travelers. People who received inactivated measles vaccine or an unknown type of measles vaccine during 1963-1967 should receive 2 doses of MMR vaccine. People who received inactivated mumps vaccine or an unknown type of mumps vaccine before 1979 and are at high risk for mumps infection should consider immunization with 2 doses of MMR vaccine. Unvaccinated health care workers born before [birth year] who lack laboratory evidence of measles, mumps, or rubella immunity or laboratory confirmation of disease should consider measles and mumps immunization with 2 doses of MMR vaccine or rubella immunization with 1 dose of MMR vaccine.  Pneumococcal 13-valent conjugate (PCV13) vaccine. When indicated, a person who is uncertain of his immunization history and has no record of immunization should receive the PCV13 vaccine. All adults 25 years of age and older should receive this vaccine. An adult aged 84 years or older who has certain medical conditions and has not been previously immunized should receive 1 dose of PCV13 vaccine. This PCV13 should be followed with a dose of pneumococcal polysaccharide (PPSV23) vaccine. Adults who are at high risk for pneumococcal disease should obtain the PPSV23 vaccine at least 8 weeks after the dose of PCV13 vaccine. Adults older than 63 years of age who have normal immune system function should obtain the PPSV23 vaccine dose at least 1 year after the dose of PCV13 vaccine.  Pneumococcal polysaccharide (PPSV23) vaccine. When PCV13 is also indicated, PCV13 should be obtained first. All adults aged 63 years and older should be immunized. An adult younger than age 53 years who has certain medical conditions should be immunized. Any person who resides in a nursing home or long-term care facility should be immunized. An adult smoker should be immunized. People with an immunocompromised condition and certain other conditions should receive both PCV13 and PPSV23 vaccines.  People with human immunodeficiency virus (HIV) infection should be immunized as soon as possible  after diagnosis. Immunization during chemotherapy or radiation therapy should be avoided. Routine use of PPSV23 vaccine is not recommended for American Indians, [place] Natives, or people younger than 65 years unless there are medical conditions that require PPSV23 vaccine. When indicated, people who have unknown immunization and have no record of immunization should receive PPSV23 vaccine. One-time revaccination 5 years after the first dose of PPSV23 is recommended for people aged 19-64 years who have chronic kidney failure, nephrotic syndrome, asplenia, or immunocompromised conditions. People who received 1-2 doses of PPSV23 before age 48 years should receive another dose of PPSV23 vaccine at age 61 years or later if at least 5 years have passed since the previous dose. Doses of PPSV23 are not needed for people immunized with PPSV23 at or after age 60 years.  Meningococcal vaccine. Adults with asplenia or persistent complement component deficiencies should receive 2 doses of quadrivalent meningococcal conjugate (MenACWY-D) vaccine. The doses should be obtained at least 2 months apart. Microbiologists working with certain meningococcal bacteria, [place] recruits, people at risk during an outbreak, and people who travel to or live in countries with a high rate of meningitis should be immunized. A first-year college student up through age 60 years who is living in a residence hall should receive a dose if he did not receive a dose on or after his 16th birthday. Adults who have certain high-risk conditions should receive one or more doses of vaccine.  Hepatitis A vaccine. Adults who wish to be protected from this disease, have chronic liver disease, work with hepatitis A-infected animals, work in hepatitis A research labs, or travel to or work in countries with a high rate of hepatitis A should be immunized. Adults who  were previously unvaccinated and who anticipate close contact with an international adoptee during the first 60 days after arrival in the [place] from a country with a high rate of hepatitis A should be immunized.  Hepatitis B vaccine. Adults should be immunized if they wish to be protected from this disease, are under age 24 years and have diabetes, have chronic liver disease, have had more than one sex partner in the past 6 months, may be exposed to blood or other infectious body fluids, are household contacts or sex partners of hepatitis B positive people, are clients or workers in certain care facilities, or travel to or work in countries with a high rate of hepatitis B.  Haemophilus influenzae type b (Hib) vaccine. A previously unvaccinated person with asplenia or sickle cell disease or having a scheduled splenectomy should receive 1 dose of Hib vaccine. Regardless of previous immunization, a recipient of a hematopoietic stem cell transplant should receive a 3-dose series 6-12 months after his successful transplant. Hib vaccine is not recommended for adults with HIV infection. Preventive Service / Frequency Ages 47 to 57  Blood pressure check.** / Every 3-5 years.  Lipid and cholesterol check.** / Every 5 years beginning at age 52.  Hepatitis C blood test.** / For any individual with known risks for hepatitis C.  Skin self-exam. / Monthly.  Influenza vaccine. / Every year.  Tetanus, diphtheria, and acellular pertussis (Tdap, Td) vaccine.** / Consult your health care provider. 1 dose of Td every 10 years.  Varicella vaccine.** / Consult your health care provider.  HPV vaccine. / 3 doses over 6 months, if 16 or younger.  Measles, mumps, rubella (MMR) vaccine.** / You need at least 1 dose of MMR if you were born in 1957 or later. You may  also need a second dose.  Pneumococcal 13-valent conjugate (PCV13) vaccine.** / Consult your health care provider.  Pneumococcal polysaccharide  (PPSV23) vaccine.** / 1 to 2 doses if you smoke cigarettes or if you have certain conditions.  Meningococcal vaccine.** / 1 dose if you are age 59 to 72 years and a Market researcher living in a residence hall, or have one of several medical conditions. You may also need additional booster doses.  Hepatitis A vaccine.** / Consult your health care provider.  Hepatitis B vaccine.** / Consult your health care provider.  Haemophilus influenzae type b (Hib) vaccine.** / Consult your health care provider. Ages 53 to 65  Blood pressure check.** / Every year.  Lipid and cholesterol check.** / Every 5 years beginning at age 75.  Lung cancer screening. / Every year if you are aged 80-80 years and have a 30-pack-year history of smoking and currently smoke or have quit within the past 15 years. Yearly screening is stopped once you have quit smoking for at least 15 years or develop a health problem that would prevent you from having lung cancer treatment.  Fecal occult blood test (FOBT) of stool. / Every year beginning at age 21 and continuing until age 86. You may not have to do this test if you get a colonoscopy every 10 years.  Flexible sigmoidoscopy** or colonoscopy.** / Every 5 years for a flexible sigmoidoscopy or every 10 years for a colonoscopy beginning at age 76 and continuing until age 79.  Hepatitis C blood test.** / For all people born from [birth year] through 1965 and any individual with known risks for hepatitis C.  Skin self-exam. / Monthly.  Influenza vaccine. / Every year.  Tetanus, diphtheria, and acellular pertussis (Tdap/Td) vaccine.** / Consult your health care provider. 1 dose of Td every 10 years.  Varicella vaccine.** / Consult your health care provider.  Zoster vaccine.** / 1 dose for adults aged 72 years or older.  Measles, mumps, rubella (MMR) vaccine.** / You need at least 1 dose of MMR if you were born in 1957 or later. You may also need a second  dose.  Pneumococcal 13-valent conjugate (PCV13) vaccine.** / Consult your health care provider.  Pneumococcal polysaccharide (PPSV23) vaccine.** / 1 to 2 doses if you smoke cigarettes or if you have certain conditions.  Meningococcal vaccine.** / Consult your health care provider.  Hepatitis A vaccine.** / Consult your health care provider.  Hepatitis B vaccine.** / Consult your health care provider.  Haemophilus influenzae type b (Hib) vaccine.** / Consult your health care provider. Ages 53 and over  Blood pressure check.** / Every year.  Lipid and cholesterol check.**/ Every 5 years beginning at age 2.  Lung cancer screening. / Every year if you are aged 62-80 years and have a 30-pack-year history of smoking and currently smoke or have quit within the past 15 years. Yearly screening is stopped once you have quit smoking for at least 15 years or develop a health problem that would prevent you from having lung cancer treatment.  Fecal occult blood test (FOBT) of stool. / Every year beginning at age 58 and continuing until age 68. You may not have to do this test if you get a colonoscopy every 10 years.  Flexible sigmoidoscopy** or colonoscopy.** / Every 5 years for a flexible sigmoidoscopy or every 10 years for a colonoscopy beginning at age 63 and continuing until age 49.  Hepatitis C blood test.** / For all people born from [birth year] through 1964  and any individual with known risks for hepatitis C.  Abdominal aortic aneurysm (AAA) screening.** / A one-time screening for ages 4 to 21 years who are current or former smokers.  Skin self-exam. / Monthly.  Influenza vaccine. / Every year.  Tetanus, diphtheria, and acellular pertussis (Tdap/Td) vaccine.** / 1 dose of Td every 10 years.  Varicella vaccine.** / Consult your health care provider.  Zoster vaccine.** / 1 dose for adults aged 85 years or older.  Pneumococcal 13-valent conjugate (PCV13) vaccine.** / 1 dose for all adults  aged 7 years and older.  Pneumococcal polysaccharide (PPSV23) vaccine.** / 1 dose for all adults aged 21 years and older.  Meningococcal vaccine.** / Consult your health care provider.  Hepatitis A vaccine.** / Consult your health care provider.  Hepatitis B vaccine.** / Consult your health care provider.  Haemophilus influenzae type b (Hib) vaccine.** / Consult your health care provider. **Family history and personal history of risk and conditions may change your health care provider's recommendations.   This information is not intended to replace advice given to you by your health care provider. Make sure you discuss any questions you have with your health care provider.   Document Released: 11/28/2001 Document Revised: 10/23/2014 Document Reviewed: 02/27/2011 Elsevier Interactive Patient Education 2016 [employer].    Please realize, EXERCISE IS MEDICINE!  -  American Heart Association ( AHA) guidelines for exercise : If you are in good health, without any medical conditions, you should engage in 150-300 minutes of moderate intensity aerobic activity per week.  This means you should be huffing and puffing throughout your workout.   Engaging in regular exercise will improve brain function and memory, as well as improve mood, boost immune system and help with weight management.  As well as the other, more well-known effects of exercise such as decreasing blood sugar levels, decreasing blood pressure,  and decreasing bad cholesterol levels/ increasing good cholesterol levels.     -  The AHA strongly endorses consumption of a diet that contains a variety of foods from all the food categories with an emphasis on fruits and vegetables; fat-free and low-fat dairy products; cereal and grain products; legumes and nuts; and fish, poultry, and/or extra lean meats.    Excessive food intake, especially of foods high in saturated and trans fats, sugar, and salt, should be avoided.    Adequate water  intake of roughly 1/2 of your weight in pounds, should equal the ounces of water per day you should drink.  So for instance, if you're 200 pounds, that would be 100 ounces of water per day.         Mediterranean Diet  Why follow it? Research shows. . Those who follow the Mediterranean diet have a reduced risk of heart disease  . The diet is associated with a reduced incidence of Parkinson's and Alzheimer's diseases . People following the diet may have longer life expectancies and lower rates of chronic diseases  . The Dietary Guidelines for Americans recommends the Mediterranean diet as an eating plan to promote health and prevent disease  What Is the Mediterranean Diet?  . Healthy eating plan based on typical foods and recipes of Mediterranean-style cooking . The diet is primarily a plant based diet; these foods should make up a majority of meals   Starches - Plant based foods should make up a majority of meals - They are an important sources of vitamins, minerals, energy, antioxidants, and fiber - Choose whole grains, foods high in  fiber and minimally processed items  - Typical grain sources include wheat, oats, barley, corn, brown rice, bulgar, farro, millet, polenta, couscous  - Various types of beans include chickpeas, lentils, fava beans, black beans, white beans   Fruits  Veggies - Large quantities of antioxidant rich fruits & veggies; 6 or more servings  - Vegetables can be eaten raw or lightly drizzled with oil and cooked  - Vegetables common to the traditional Mediterranean Diet include: artichokes, arugula, beets, broccoli, brussel sprouts, cabbage, carrots, celery, collard greens, cucumbers, eggplant, kale, leeks, lemons, lettuce, mushrooms, okra, onions, peas, peppers, potatoes, pumpkin, radishes, rutabaga, shallots, spinach, sweet potatoes, turnips, zucchini - Fruits common to the Mediterranean Diet include: apples, apricots, avocados, cherries, clementines, dates, figs,  grapefruits, grapes, melons, nectarines, oranges, peaches, pears, pomegranates, strawberries, tangerines  Fats - Replace butter and margarine with healthy oils, such as olive oil, canola oil, and tahini  - Limit nuts to no more than a handful a day  - Nuts include walnuts, almonds, pecans, pistachios, pine nuts  - Limit or avoid candied, honey roasted or heavily salted nuts - Olives are central to the [employer] - can be eaten whole or used in a variety of dishes   Meats Protein - Limiting red meat: no more than a few times a month - When eating red meat: choose lean cuts and keep the portion to the size of deck of cards - Eggs: approx. 0 to 4 times a week  - Fish and lean poultry: at least 2 a week  - Healthy protein sources include, chicken, [place], lean beef, lamb - Increase intake of seafood such as tuna, salmon, trout, mackerel, shrimp, scallops - Avoid or limit high fat processed meats such as sausage and bacon  Dairy - Include moderate amounts of low fat dairy products  - Focus on healthy dairy such as fat free yogurt, skim milk, low or reduced fat cheese - Limit dairy products higher in fat such as whole or 2% milk, cheese, ice cream  Alcohol - Moderate amounts of red wine is ok  - No more than 5 oz daily for women (all ages) and men older than age 3  - No more than 10 oz of wine daily for men younger than 43  Other - Limit sweets and other desserts  - Use herbs and spices instead of salt to flavor foods  - Herbs and spices common to the traditional Mediterranean Diet include: basil, bay leaves, chives, cloves, cumin, fennel, garlic, lavender, marjoram, mint, oregano, parsley, pepper, rosemary, sage, savory, sumac, tarragon, thyme   It's not just a diet, it's a lifestyle:  . The Mediterranean diet includes lifestyle factors typical of those in the region  . Foods, drinks and meals are best eaten with others and savored . Daily physical activity is important for overall good  health . This could be strenuous exercise like running and aerobics . This could also be more leisurely activities such as walking, housework, yard-work, or taking the stairs . Moderation is the key; a balanced and healthy diet accommodates most foods and drinks . Consider portion sizes and frequency of consumption of certain foods   Meal Ideas & Options:  . Breakfast:  o Whole wheat toast or whole wheat English muffins with peanut butter & hard boiled egg o Steel cut oats topped with apples & cinnamon and skim milk  o Fresh fruit: banana, strawberries, melon, berries, peaches  o Smoothies: strawberries, bananas, greek yogurt, peanut butter o Low  fat greek yogurt with blueberries and granola  o Egg white omelet with spinach and mushrooms o Breakfast couscous: whole wheat couscous, apricots, skim milk, cranberries  . Sandwiches:  o Hummus and grilled vegetables (peppers, zucchini, squash) on whole wheat bread   o Grilled chicken on whole wheat pita with lettuce, tomatoes, cucumbers or tzatziki  o Tuna salad on whole wheat bread: tuna salad made with greek yogurt, olives, red peppers, capers, green onions o Garlic rosemary lamb pita: lamb sauted with garlic, rosemary, salt & pepper; add lettuce, cucumber, greek yogurt to pita - flavor with lemon juice and black pepper  . Seafood:  o Mediterranean grilled salmon, seasoned with garlic, basil, parsley, lemon juice and black pepper o Shrimp, lemon, and spinach whole-grain pasta salad made with low fat greek yogurt  o Seared scallops with lemon orzo  o Seared tuna steaks seasoned salt, pepper, coriander topped with tomato mixture of olives, tomatoes, olive oil, minced garlic, parsley, green onions and cappers  . Meats:  o Herbed greek chicken salad with kalamata olives, cucumber, feta  o Red bell peppers stuffed with spinach, bulgur, lean ground beef (or lentils) & topped with feta   o Kebabs: skewers of chicken, tomatoes, onions, zucchini,  squash  o [place] burgers: made with red onions, mint, dill, lemon juice, feta cheese topped with roasted red peppers . Vegetarian o Cucumber salad: cucumbers, artichoke hearts, celery, red onion, feta cheese, tossed in olive oil & lemon juice  o Hummus and whole grain pita points with a greek salad (lettuce, tomato, feta, olives, cucumbers, red onion) o Lentil soup with celery, carrots made with vegetable broth, garlic, salt and pepper  o Tabouli salad: parsley, bulgur, mint, scallions, cucumbers, tomato, radishes, lemon juice, olive oil, salt and pepper.     Diabetes Mellitus and Standards of Medical Care  Managing diabetes (diabetes mellitus) can be complicated. Your diabetes treatment may be managed by a team of health care providers, including:  A diet and nutrition specialist (registered dietitian).  A nurse.  A certified diabetes educator (CDE).  A diabetes specialist (endocrinologist).  An eye doctor.  A primary care provider.  A dentist.  Your health care providers follow a schedule in order to help you get the best quality of care. The following schedule is a general guideline for your diabetes management plan. Your health care providers may also give you more specific instructions.  HbA1c (hemoglobin A1c) test This test provides information about blood sugar (glucose) control over the previous 2-3 months. It is used to check whether your diabetes management plan needs to be adjusted.  If you are meeting your treatment goals, this test is done at least 2 times a year.  If you are not meeting treatment goals or if your treatment goals have changed, this test is done 4 times a year.  Blood pressure test  This test is done at every routine medical visit. For most people, the goal is less than 130/80. Ask your health care provider what your goal blood pressure should be.  Dental and eye exams  Visit your dentist two times a year.  If you have type 1 diabetes, get an  eye exam 3-5 years after you are diagnosed, and then once a year after your first exam. ? If you were diagnosed with type 1 diabetes as a child, get an eye exam when you are age 14 or older and have had diabetes for 3-5 years. After the first exam, you should get an eye  exam once a year.  If you have type 2 diabetes, have an eye exam as soon as you are diagnosed, and then once a year after your first exam.  Foot care exam  Visual foot exams are done at every routine medical visit. The exams check for cuts, bruises, redness, blisters, sores, or other problems with the feet.  A complete foot exam is done by your health care provider once a year. This exam includes an inspection of the structure and skin of your feet, and a check of the pulses and sensation in your feet. ? Type 1 diabetes: Get your first exam 3-5 years after diagnosis. ? Type 2 diabetes: Get your first exam as soon as you are diagnosed.  Check your feet every day for cuts, bruises, redness, blisters, or sores. If you have any of these or other problems that are not healing, contact your health care provider.  Kidney function test (urine microalbumin)  This test is done once a year. ? Type 1 diabetes: Get your first test 5 years after diagnosis. ? Type 2 diabetes: Get your first test as soon as you are diagnosed._  If you have chronic kidney disease (CKD), get a serum creatinine and estimated glomerular filtration rate (eGFR) test once a year.  Lipid profile (cholesterol, HDL, LDL, triglycerides)  This test should be done when you are diagnosed with diabetes, and every 5 years after the first test. If you are on medicines to lower your cholesterol, you may need to get this test done every year. ? The goal for LDL is less than 100 mg/dL (5.5 mmol/L). If you are at high risk, the goal is less than 70 mg/dL (3.9 mmol/L). ? The goal for HDL is 40 mg/dL (2.2 mmol/L) for men and 50 mg/dL(2.8 mmol/L) for women. An HDL cholesterol of  60 mg/dL (3.3 mmol/L) or higher gives some protection against heart disease. ? The goal for triglycerides is less than 150 mg/dL (8.3 mmol/L).  Immunizations  The yearly flu (influenza) vaccine is recommended for everyone 6 months or older who has diabetes.  The pneumonia (pneumococcal) vaccine is recommended for everyone 2 years or older who has diabetes. If you are 71 or older, you may get the pneumonia vaccine as a series of two separate shots.  The hepatitis B vaccine is recommended for adults shortly after they have been diagnosed with diabetes.  The Tdap (tetanus, diphtheria, and pertussis) vaccine should be given: ? According to normal childhood vaccination schedules, for children. ? Every 10 years, for adults who have diabetes.  The shingles vaccine is recommended for people who have had chicken pox and are 50 years or older.  Mental and emotional health  Screening for symptoms of eating disorders, anxiety, and depression is recommended at the time of diagnosis and afterward as needed. If your screening shows that you have symptoms (you have a positive screening result), you may need further evaluation and be referred to a mental health care provider.  Diabetes self-management education  Education about how to manage your diabetes is recommended at diagnosis and ongoing as needed.  Treatment plan  Your treatment plan will be reviewed at every medical visit.  Summary  Managing diabetes (diabetes mellitus) can be complicated. Your diabetes treatment may be managed by a team of health care providers.  Your health care providers follow a schedule in order to help you get the best quality of care.  Standards of care including having regular physical exams, blood tests, blood pressure  monitoring, immunizations, screening tests, and education about how to manage your diabetes.  Your health care providers may also give you more specific instructions based on your individual  health.      Type 2 Diabetes Mellitus, Self Care, Adult Caring for yourself after you have been diagnosed with type 2 diabetes (type 2 diabetes mellitus) means keeping your blood sugar (glucose) under control with a balance of:  Nutrition.  Exercise.  Lifestyle changes.  Medicines or insulin, if necessary.  Support from your team of health care providers and others.  The following information explains what you need to know to manage your diabetes at home. What do I need to do to manage my blood glucose?  Check your blood glucose every day, as often as told by your health care provider.  Contact your health care provider if your blood glucose is above your target for 2 tests in a row.  Have your A1c (hemoglobin A1c) level checked at least two times a year, or as often as told by your health care provider. Your health care provider will set individualized treatment goals for you. Generally, the goal of treatment is to maintain the following blood glucose levels:  Before meals (preprandial): 80-130 mg/dL (4.4-7.2 mmol/L).  After meals (postprandial): below 180 mg/dL (10 mmol/L).  A1c level: less than 7%.  What do I need to know about hyperglycemia and hypoglycemia? What is hyperglycemia? Hyperglycemia, also called high blood glucose, occurs when blood glucose is too high.Make sure you know the early signs of hyperglycemia, such as:  Increased thirst.  Hunger.  Feeling very tired.  Needing to urinate more often than usual.  Blurry vision.  What is hypoglycemia? Hypoglycemia, also called low blood glucose, occurswith a blood glucose level at or below 70 mg/dL (3.9 mmol/L). The risk for hypoglycemia increases during or after exercise, during sleep, during illness, and when skipping meals or not eating for a long time (fasting). It is important to know the symptoms of hypoglycemia and treat it right away. Always have a 15-gram rapid-acting carbohydrate snack with you to  treat low blood glucose. Family members and close friends should also know the symptoms and should understand how to treat hypoglycemia, in case you are not able to treat yourself. What are the symptoms of hypoglycemia? Hypoglycemia symptoms can include:  Hunger.  Anxiety.  Sweating and feeling clammy.  Confusion.  Dizziness or feeling light-headed.  Sleepiness.  Nausea.  Increased heart rate.  Headache.  Blurry vision.  Seizure.  Nightmares.  Tingling or numbness around the mouth, lips, or tongue.  A change in speech.  Decreased ability to concentrate.  A change in coordination.  Restless sleep.  Tremors or shakes.  Fainting.  Irritability.  How do I treat hypoglycemia?  If you are alert and able to swallow safely, follow the 15:15 rule:  Take 15 grams of a rapid-acting carbohydrate. Rapid-acting options include: ? 1 tube of glucose gel. ? 3 glucose pills. ? 6-8 pieces of hard candy. ? 4 oz (120 mL) of fruit juice. ? 4 oz (120 mL) of regular (not diet) soda.  Check your blood glucose 15 minutes after you take the carbohydrate.  If the repeat blood glucose level is still at or below 70 mg/dL (3.9 mmol/L), take 15 grams of a carbohydrate again.  If your blood glucose level does not increase above 70 mg/dL (3.9 mmol/L) after 3 tries, seek emergency medical care.  After your blood glucose level returns to normal, eat a meal or  a snack within 1 hour.  How do I treat severe hypoglycemia? Severe hypoglycemia is when your blood glucose level is at or below 54 mg/dL (3 mmol/L). Severe hypoglycemia is an emergency. Do not wait to see if the symptoms will go away. Get medical help right away. Call your local emergency services (911 in the [place] Do not drive yourself to the hospital. If you have severe hypoglycemia and you cannot eat or drink, you may need an injection of glucagon. A family member or close friend should learn how to check your blood glucose and  how to give you a glucagon injection. Ask your health care provider if you need to have an emergency glucagon injection kit available. Severe hypoglycemia may need to be treated in a hospital. The treatment may include getting glucose through an IV tube. You may also need treatment for the cause of your hypoglycemia. Can having diabetes put me at risk for other conditions? Having diabetes can put you at risk for other long-term (chronic) conditions, such as heart disease and kidney disease. Your health care provider may prescribe medicines to help prevent complications from diabetes. These medicines may include:  Aspirin.  Medicine to lower cholesterol.  Medicine to control blood pressure.  What else can I do to manage my diabetes? Take your diabetes medicines as told  If your health care provider prescribed insulin or diabetes medicines, take them every day.  Do not run out of insulin or other diabetes medicines that you take. Plan ahead so you always have these available.  If you use insulin, adjust your dosage based on how physically active you are and what foods you eat. Your health care provider will tell you how to adjust your dosage. Make healthy food choices  The things that you eat and drink affect your blood glucose and your insulin dosage. Making good choices helps to control your diabetes and prevent other health problems. A healthy meal plan includes eating lean proteins, complex carbohydrates, fresh fruits and vegetables, low-fat dairy products, and healthy fats. Make an appointment to see a diet and nutrition specialist (registered dietitian) to help you create an eating plan that is right for you. Make sure that you:  Follow instructions from your health care provider about eating or drinking restrictions.  Drink enough fluid to keep your urine clear or pale yellow.  Eat healthy snacks between nutritious meals.  Track the carbohydrates that you eat. Do this by reading  food labels and learning the standard serving sizes of foods.  Follow your sick day plan whenever you cannot eat or drink as usual. Make this plan in advance with your health care provider.  Stay active  Exercise regularly, as told by your health care provider. This may include:  Stretching and doing strength exercises, such as yoga or weightlifting, at least 2 times a week.  Doing at least 150 minutes of moderate-intensity or vigorous-intensity exercise each week. This could be brisk walking, biking, or water aerobics. ? Spread out your activity over at least 3 days of the week. ? Do not go more than 2 days in a row without doing some kind of physical activity.  When you start a new exercise or activity, work with your health care provider to adjust your insulin, medicines, or food intake as needed. Make healthy lifestyle choices  Do not use any tobacco products, such as cigarettes, chewing tobacco, and e-cigarettes. If you need help quitting, ask your health care provider.  If your health care  provider says that alcohol is safe for you, limit alcohol intake to no more than 1 drink per day for nonpregnant women and 2 drinks per day for men. One drink equals 12 oz of beer, 5 oz of wine, or 1 oz of hard liquor.  Learn to manage stress. If you need help with this, ask your health care provider. Care for your body   Keep your immunizations up to date. In addition to getting vaccinations as told by your health care provider, it is recommended that you get vaccinated against the following illnesses: ? The flu (influenza). Get a flu shot every year. ? Pneumonia. ? Hepatitis B.  Schedule an eye exam soon after your diagnosis, and then one time every year after that.  Check your skin and feet every day for cuts, bruises, redness, blisters, or sores. Schedule a foot exam with your health care provider once every year.  Brush your teeth and gums two times a day, and floss at least one time a  day. Visit your dentist at least once every 6 months.  Maintain a healthy weight. General instructions  Take over-the-counter and prescription medicines only as told by your health care provider.  Share your diabetes management plan with people in your workplace, school, and household.  Check your urine for ketones when you are ill and as told by your health care provider.  Ask your health care provider: ? Do I need to meet with a diabetes educator? ? Where can I find a support group for people with diabetes?  Carry a medical alert card or wear medical alert jewelry.  Keep all follow-up visits as told by your health care provider. This is important. Where to find more information: For more information about diabetes, visit:  American Diabetes Association (ADA): www.diabetes.org  American Association of Diabetes Educators (AADE): www.diabeteseducator.org/patient-resources  This information is not intended to replace advice given to you by your health care provider. Make sure you discuss any questions you have with your health care provider. Document Released: 01/24/2016 Document Revised: 03/09/2016 Document Reviewed: 11/05/2015 Elsevier Interactive Patient Education  2017 [place] [employer].      Blood Glucose Monitoring, Adult Monitoring your blood sugar (glucose) helps you manage your diabetes. It also helps you and your health care provider determine how well your diabetes management plan is working. Blood glucose monitoring involves checking your blood glucose as often as directed, and keeping a record (log) of your results over time. Why should I monitor my blood glucose? Checking your blood glucose regularly can:  Help you understand how food, exercise, illnesses, and medicines affect your blood glucose.  Let you know what your blood glucose is at any time. You can quickly tell if you are having low blood glucose (hypoglycemia) or high blood glucose (hyperglycemia).  Help  you and your health care provider adjust your medicines as needed.  When should I check my blood glucose? Follow instructions from your health care provider about how often to check your blood glucose.   This may depend on:  The type of diabetes you have.  How well-controlled your diabetes is.  Medicines you are taking.  If you have type 1 diabetes:  Check your blood glucose at least 2 times a day.  Also check your blood glucose: ? Before every insulin injection. ? Before and after exercise. ? Between meals. ? 2 hours after a meal. ? Occasionally between 2:00 a.m. and 3:00 a.m., as directed. ? Before potentially dangerous tasks, like driving or  using heavy machinery. ? At bedtime.  You may need to check your blood glucose more often, up to 6-10 times a day: ? If you use an insulin pump. ? If you need multiple daily injections (MDI). ? If your diabetes is not well-controlled. ? If you are ill. ? If you have a history of severe hypoglycemia. ? If you have a history of not knowing when your blood glucose is getting low (hypoglycemia unawareness).  If you have type 2 diabetes:  If you take insulin or other diabetes medicines, check your blood glucose at least 2 times a day.  If you are on intensive insulin therapy, check your blood glucose at least 4 times a day. Occasionally, you may also need to check between 2:00 a.m. and 3:00 a.m., as directed.  Also check your blood glucose: ? Before and after exercise. ? Before potentially dangerous tasks, like driving or using heavy machinery.  You may need to check your blood glucose more often if: ? Your medicine is being adjusted. ? Your diabetes is not well-controlled. ? You are ill.  What is a blood glucose log?  A blood glucose log is a record of your blood glucose readings. It helps you and your health care provider: ? Look for patterns in your blood glucose over time. ? Adjust your diabetes management plan as  needed.  Every time you check your blood glucose, write down your result and notes about things that may be affecting your blood glucose, such as your diet and exercise for the day.  Most glucose meters store a record of glucose readings in the meter. Some meters allow you to download your records to a computer. How do I check my blood glucose? Follow these steps to get accurate readings of your blood glucose: Supplies needed   Blood glucose meter.  Test strips for your meter. Each meter has its own strips. You must use the strips that come with your meter.  A needle to prick your finger (lancet). Do not use lancets more than once.  A device that holds the lancet (lancing device).  A journal or log book to write down your results.  Procedure  Wash your hands with soap and water.  Prick the side of your finger (not the tip) with the lancet. Use a different finger each time.  Gently rub the finger until a small drop of blood appears.  Follow instructions that come with your meter for inserting the test strip, applying blood to the strip, and using your blood glucose meter.  Write down your result and any notes.  Alternative testing sites  Some meters allow you to use areas of your body other than your finger (alternative sites) to test your blood.  If you think you may have hypoglycemia, or if you have hypoglycemia unawareness, do not use alternative sites. Use your finger instead.  Alternative sites may not be as accurate as the fingers, because blood flow is slower in these areas. This means that the result you get may be delayed, and it may be different from the result that you would get from your finger.  The most common alternative sites are: ? Forearm. ? Thigh. ? Palm of the hand.  Additional tips  Always keep your supplies with you.  If you have questions or need help, all blood glucose meters have a 24-hour "hotline" number that you can call. You may also  contact your health care provider.  After you use a few boxes of  test strips, adjust (calibrate) your blood glucose meter by following instructions that came with your meter.    The American Diabetes Association suggests the following targets for most nonpregnant adults with diabetes.  More or less stringent glycemic goals may be appropriate for each individual.  A1C: Less than 7% A1C may also be reported as eAG: Less than 154 mg/dl Before a meal (preprandial plasma glucose): 80-130 mg/dl 1-2 hours after beginning of the meal (Postprandial plasma glucose)*: Less than 180 mg/dl  *Postprandial glucose may be targeted if A1C goals are not met despite reaching preprandial glucose goals.   GOALS in short:  The goals are for the Hgb A1C to be less than 7.0 & blood pressure to be less than 130/80.    It is recommended that all diabetics are educated on and follow a healthy diabetic diet, exercise for 30 minutes 3-4 times per week (walking, biking, swimming, or machine), monitor blood glucose readings and bring that record with you to be reviewed at your next office visit.     You should be checking fasting blood sugars- especially after you eat poorly or eat really healthy, and also check 2 hour postprandial blood sugars after largest meal of the day.    Write these down and bring in your log at each office visit.    You will need to be seen every 3 months by the provider managing your Diabetes unless told otherwise by that provider.   You will need yearly eye exams from an eye specialist and foot exams to check the nerves of your feet.  Also, your urine should be checked yearly as well to make sure excess protein is not present.   If you are checking your blood pressure at home, please record it and bring it to your next office visit.    Follow the Dietary Approaches to Stop Hypertension (DASH) diet (3 servings of fruit and vegetables daily, whole grains, low sodium, low-fat proteins).  See  below.    Lastly, when it comes to your cholesterol, the goal is to have the HDL (good cholesterol) >40, and the LDL (bad cholesterol) <100.   It is recommended that you follow a heart healthy, low saturated and trans-fat diet and exercise for 30 minutes at least 5 times a week.     (( Check out the DASH diet = 1.5 Gram Low Sodium Diet   A 1.5 gram sodium diet restricts the amount of sodium in the diet to no more than 1.5 g or 1500 mg daily.  The American Heart Association recommends Americans over the age of 57 to consume no more than 1500 mg of sodium each day to reduce the risk of developing high blood pressure.  Research also shows that limiting sodium may reduce heart attack and stroke risk.  Many foods contain sodium for flavor and sometimes as a preservative.  When the amount of sodium in a diet needs to be low, it is important to know what to look for when choosing foods and drinks.  The following includes some information and guidelines to help make it easier for you to adapt to a low sodium diet.    QUICK TIPS  Do not add salt to food.  Avoid convenience items and fast food.  Choose unsalted snack foods.  Buy lower sodium products, often labeled as "lower sodium" or "no salt added."  Check food labels to learn how much sodium is in 1 serving.  When eating at a restaurant, ask that  your food be prepared with less salt or none, if possible.    READING FOOD LABELS FOR SODIUM INFORMATION  The nutrition facts label is a good place to find how much sodium is in foods. Look for products with no more than 400 mg of sodium per serving.  Remember that 1.5 g = 1500 mg.  The food label may also list foods as:  Sodium-free: Less than 5 mg in a serving.  Very low sodium: 35 mg or less in a serving.  Low-sodium: 140 mg or less in a serving.  Light in sodium: 50% less sodium in a serving. For example, if a food that usually has 300 mg of sodium is changed to become light in sodium, it will have  150 mg of sodium.  Reduced sodium: 25% less sodium in a serving. For example, if a food that usually has 400 mg of sodium is changed to reduced sodium, it will have 300 mg of sodium.    CHOOSING FOODS  Grains  Avoid: Salted crackers and snack items. Some cereals, including instant hot cereals. Bread stuffing and biscuit mixes. Seasoned rice or pasta mixes.  Choose: Unsalted snack items. Low-sodium cereals, oats, puffed wheat and rice, shredded wheat. English muffins and bread. Pasta.  Meats  Avoid: Salted, canned, smoked, spiced, pickled meats, including fish and poultry. Bacon, ham, sausage, cold cuts, hot dogs, anchovies.  Choose: Low-sodium canned tuna and salmon. Fresh or frozen meat, poultry, and fish.  Dairy  Avoid: Processed cheese and spreads. Cottage cheese. Buttermilk and condensed milk. Regular cheese.  Choose: Milk. Low-sodium cottage cheese. Yogurt. Sour cream. Low-sodium cheese.  Fruits and Vegetables  Avoid: Regular canned vegetables. Regular canned tomato sauce and paste. Frozen vegetables in sauces. Olives. Angie Fava. Relishes. Sauerkraut.  Choose: Low-sodium canned vegetables. Low-sodium tomato sauce and paste. Frozen or fresh vegetables. Fresh and frozen fruit.  Condiments  Avoid: Canned and packaged gravies. Worcestershire sauce. Tartar sauce. Barbecue sauce. Soy sauce. Steak sauce. Ketchup. Onion, garlic, and table salt. Meat flavorings and tenderizers.  Choose: Fresh and dried herbs and spices. Low-sodium varieties of mustard and ketchup. Lemon juice. Tabasco sauce. Horseradish.    SAMPLE 1.5 GRAM SODIUM MEAL PLAN:   Breakfast / Sodium (mg)  1 cup low-fat milk / 143 mg  1 whole-wheat English muffin / 240 mg  1 tbs heart-healthy margarine / 153 mg  1 hard-boiled egg / 139 mg  1 small orange / 0 mg  Lunch / Sodium (mg)  1 cup raw carrots / 76 mg  2 tbs no salt added peanut butter / 5 mg  2 slices whole-wheat bread / 270 mg  1 tbs jelly / 6 mg   cup red grapes / 2  mg  Dinner / Sodium (mg)  1 cup whole-wheat pasta / 2 mg  1 cup low-sodium tomato sauce / 73 mg  3 oz lean ground beef / 57 mg  1 small side salad (1 cup raw spinach leaves,  cup cucumber,  cup yellow bell pepper) with 1 tsp olive oil and 1 tsp red wine vinegar / 25 mg  Snack / Sodium (mg)  1 container low-fat vanilla yogurt / 107 mg  3 graham cracker squares / 127 mg  Nutrient Analysis  Calories: 1745  Protein: 75 g  Carbohydrate: 237 g  Fat: 57 g  Sodium: 1425 mg  Document Released: 10/02/2005 Document Revised: 06/14/2011 Document Reviewed: 01/03/2010  ExitCare Patient Information 2012 [place].))    This information is not intended to  replace advice given to you by your health care provider. Make sure you discuss any questions you have with your health care provider. Document Released: 10/05/2003 Document Revised: 04/21/2016 Document Reviewed: 03/13/2016 [employer]vier Interactive Patient Education  2017 [employer].

## 2020-02-12 NOTE — Progress Notes (Signed)
Male physical  Impression and Recommendations:    1. Encounter for wellness examination   2. Health education/counseling   3. Screening for colon cancer   4. Need for Tdap vaccination   5. Hypertension associated with type 2 diabetes mellitus ([place])   6. Type 2 diabetes mellitus with other specified complication, with long-term current use of insulin ([facility])   7. Vitamin D insufficiency       1) Anticipatory Guidance: Discussed skin CA prevention and sunscreen when outside along with skin surveillance; eating a balanced and modest diet; physical activity at least 25 minutes per day or minimum of 150 min/ week moderate to intense activity.  - Discussed red flag symptoms of hernia / surgical emergency with patient today.  Patient knows to go to the emergency room if he experiences red flag symptoms.  - Told patient to have his wife help examine his back once every 3-4 months or so.  Discussed A,B,C,D's of skin surveillance and how to monitor for unusual skin changes.  - Discussed prudent self-testicular examination habits during appointment today.   2) Immunizations / Screenings / Labs:   All immunizations are up-to-date per recommendations or will be updated today if pt allows.    - Patient understands with dental and vision screens they will schedule independently.  - Will obtain CBC, CMP, HgA1c, Lipid panel, TSH and vit D when fasting, if not already done past 12 mo/ recently   - Last colonoscopy done 12 of 2011 with repeat recommended in 10 years.  - Due for repeat colonoscopy this year, 12 of 2021.  - Due for repeat TDAP.  - Shingles vaccination up to date.  - As a diabetic, discussed importance of continuing to obtain yearly eye exam.  - Discussed option of referral to podiatry for foot care if desired.  - Per patient, unsure if he has smoked for 30 pack-years or not.  Patient declines lung CT at this time and will consider his smoking history and whether he  desires screening or not.  - Low-risk Hep C/HIV screen declined at this time.   3) Preventative Maintenance & Health / Weight Counseling:   BMI meaning discussed with patient.  Discussed goal to improve diet habits to improve overall feelings of well being and objective health data. Improve nutrient density of diet through increasing intake of fruits and vegetables and lean proteins, and decreasing saturated fats, white flour products and refined sugars.   - Advised patient to continue working toward exercising to improve overall mental, physical, and emotional health.    - Reviewed the "spokes of the wheel" of wellbeing.  Stressed the importance of ongoing prudent habits, including regular exercise, appropriate sleep hygiene, healthful dietary habits, and prayer/meditation to relax.  - Encouraged patient to engage in daily physical activity as tolerated, especially a formal exercise routine.  Recommended that the patient eventually strive for at least 150 minutes of moderate cardiovascular activity per week according to guidelines established by the [facility].   - Patient should also consume adequate amounts of water, about 90 ounces per day.  - Strongly advised patient to avoid drinking caloric beverages.  - Health counseling performed.  All questions answered.   Recommendations - To help reduce health risks, advised patient to strive to control his blood sugars. - Patient will continue to follow up with endocrinology as established.  - Discussed goal BP of under 140/90. - If BP is not at goal with home monitoring, patient will  follow-up sooner than 3 months.    Orders Placed This Encounter  Procedures  . Tdap vaccine greater than or equal to 7yo IM  . Comprehensive metabolic panel    Order Specific Question:   Has the patient fasted?    Answer:   Yes  . VITAMIN D 25 Hydroxy (Vit-D Deficiency, Fractures)    Meds ordered this encounter  Medications  . atenolol-chlorthalidone  (TENORETIC) 50-25 MG tablet    Sig: Take 1 tablet by mouth daily.    Dispense:  90 tablet    Refill:  0  . lisinopril (ZESTRIL) 5 MG tablet    Sig: TAKE 1 TABLET BY MOUTH DAILY.    Dispense:  90 tablet    Refill:  0     Return for f/up 4 months, sooner if BP not at goal of under 140/90 on a regular basis.   Reminded pt important of f-up preventative CPE in 1 year.  Reminded pt again, this is in addition to any chronic care visits.    Gross side effects, risk and benefits, and alternatives of medications discussed with patient.  Patient is aware that all medications have potential side effects and we are unable to predict every side effect or drug-drug interaction that may occur.  Expresses verbal understanding and consents to current therapy plan and treatment regimen.  Please see AVS handed out to patient at the end of our visit for further patient instructions/ counseling done pertaining to today's office visit.   This case required medical decision making of at least moderate complexity.  This document serves as a record of services personally performed by Mellody Dance, DO. It was created on her behalf by Toni Amend, a trained medical scribe. The creation of this record is based on the scribe's personal observations and the provider's statements to them.   The above documentation from Toni Amend, medical scribe, has been reviewed by Marjory Sneddon, D.O.    Subjective:     I, Toni Amend, am serving as Education administrator for [employer].  CC: CPE  HPI: TRAVIOUS BOURNE is a 64 y.o. male who presents to [place] [facility] today for a yearly health maintenance exam.     Health Maintenance Summary  - Reviewed and updated, unless pt declines services.  Last Cologuard or Colonoscopy:  Last colonoscopy done in 12 of 2011. Family history of Colon CA:  None reported.  Tobacco History Reviewed:  Current every day smoker.  Started smoking around  age 41, anywhere from one half to one pack per day.  Currently smokes a half-pack per day.  He is unsure if he has a 30 pack-year history or not.  He tried Chantix in the past to quit smoking, and notes "it worked for a while."  Abdominal Ultrasound:  Start screening at age 69. CT scan for screening lung CA:  Patient is unsure if he has a 30 pack-year history or not.  Alcohol / drug use:  Does not drink alcohol.  Does not use drugs. Exercise Habits:   not meeting AHA guidelines Dental Home:  Goes every six months. Eye exams:  Continues to obtain yearly eye exams. Dermatology home:  Does not follow up with dermatology.  Denies new moles or skin concerns at this time.  Denies family history of skin cancer or melanoma.  Male history: STD concerns:  None, monogamous Birth control method:   n/a Additional penile/ urinary concerns:  No concerns reported.  Not currently sexually active.  Says "I'm fine right now."  Denies family history of prostate cancer.  Denies chest pain, SOB, dizziness.  Denies constipation / diarrhea.  Notes sometimes he experiences difficulty with this time to time, but it's easily managed.  Feels he pees once before bed, once in the middle of the night, and once in the morning.  This urination has increased "only with the amount of water I'm drinking."  Additional concerns beyond Health Maintenance issues:     He does not check his blood pressure at home.    Immunization History  Administered Date(s) Administered  . Influenza Split 08/10/2011, 08/12/2012  . Influenza Whole 10/16/2001, 07/23/2008, 07/30/2009, 08/02/2010  . Influenza,inj,Quad PF,6+ Mos 08/13/2013, 06/11/2014, 06/26/2016, 07/03/2017, 07/09/2018, 06/26/2019  . Influenza-Unspecified 08/13/2013, 06/11/2014, 06/26/2016, 07/03/2017, 07/09/2018  . PFIZER SARS-COV-2 Vaccination 01/08/2020, 02/02/2020  . Pneumococcal Polysaccharide-23 10/18/2012  . Td 10/16/1998, 07/30/2009  . Tdap 10/16/1998,  07/30/2009, 02/12/2020  . Zoster Recombinat (Shingrix) 08/27/2018, 11/27/2018     Health Maintenance  Topic Date Due  . Hepatitis C Screening  Never done  . HIV Screening  Never done  . FOOT EXAM  12/06/2019  . INFLUENZA VACCINE  05/16/2020  . HEMOGLOBIN A1C  07/08/2020  . COLONOSCOPY  09/26/2020  . OPHTHALMOLOGY EXAM  11/09/2020  . TETANUS/TDAP  02/11/2030  . PNEUMOCOCCAL POLYSACCHARIDE VACCINE AGE 4-64 HIGH RISK  Completed  . COVID-19 Vaccine  Completed       Wt Readings from Last 3 Encounters:  02/12/20 172 lb (78 kg)  01/06/20 176 lb (79.8 kg)  12/26/19 178 lb 6.4 oz (80.9 kg)   BP Readings from Last 3 Encounters:  02/12/20 134/84  01/06/20 114/70  12/26/19 121/63   Pulse Readings from Last 3 Encounters:  02/12/20 62  01/06/20 64  12/26/19 (!) 58    Patient Active Problem List   Diagnosis Date Noted  . Vitamin D insufficiency 02/12/2020  . Poorly controlled diabetes mellitus ([place])- mgt per Endo; Dr Loanne Drilling 12/26/2019  . Mixed diabetic hyperlipidemia associated with type 2 diabetes mellitus ([place]) 12/26/2019  . Healthcare maintenance 08/27/2018  . Popliteal cyst, left 07/03/2017  . Diabetes ([place]) 06/04/2014  . Hyperlipidemia, mixed 05/27/2007  . ED (erectile dysfunction) of organic origin 05/27/2007  . Essential hypertension 05/17/2007    Past Medical History:  Diagnosis Date  . DIABETES MELLITUS, TYPE II 05/17/2007  . DIVERTICULITIS, ACUTE 08/17/2010  . 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 HYPERLIPIDEMIA, MIXED 05/27/2007  . HYPERTENSION 05/17/2007  . TOBACCO ABUSE 05/27/2007    History reviewed. No pertinent surgical history.  Family History  Problem Relation Age of Onset  . Diabetes Mother   . Heart attack Father   . Diabetes Father     Social History   Substance and Sexual Activity  Drug Use No  ,  Social History   Substance and Sexual Activity  Alcohol Use No  . Alcohol/week: 0.0 standard drinks  ,  Social History   Tobacco Use    Smoking Status Current Every Day Smoker  . Packs/day: 0.55  . Years: 51.00  . Pack years: 28.05  Smokeless Tobacco Never Used  Tobacco Comment   electronic cigarettes, 6/1/2020pt states on and off  ,  Social History   Substance and Sexual Activity  Sexual Activity Not Currently  . Birth control/protection: None    Patient's Medications  New Prescriptions   No medications on file  Previous Medications   ASPIRIN 81 MG TABLET    Take 81 mg by mouth daily.  ATORVASTATIN (LIPITOR) 80 MG TABLET    TAKE 1 TABLET (80 MG TOTAL) BY MOUTH NIGHTLY   GLUCOSE BLOOD (CONTOUR NEXT TEST) TEST STRIP    1 each by Other route 2 (two) times daily. And lancets 2/day   INSULIN NPH HUMAN (HUMULIN N) 100 UNIT/ML INJECTION    Inject 0.65 mLs (65 Units total) into the skin every morning.   INSULIN SYRINGE-NEEDLE U-100 (INSULIN SYRINGE 1CC/30GX5/16") 30G X 5/16" 1 ML MISC    1 Syringe by Does not apply route daily. Use to inject insulin daily   METFORMIN (GLUCOPHAGE) 1000 MG TABLET    TAKE 1 TABLET BY MOUTH TWICE A DAY  Modified Medications   Modified Medication Previous Medication   ATENOLOL-CHLORTHALIDONE (TENORETIC) 50-25 MG TABLET atenolol-chlorthalidone (TENORETIC) 50-25 MG tablet      Take 1 tablet by mouth daily.    TAKE 1 TABLET BY MOUTH DAILY   LISINOPRIL (ZESTRIL) 5 MG TABLET lisinopril (ZESTRIL) 5 MG tablet      TAKE 1 TABLET BY MOUTH DAILY.    TAKE 1 TABLET BY MOUTH DAILY.  Discontinued Medications   No medications on file    Latex  Review of Systems: General:   Denies fever, chills, unexplained weight loss.  Optho/Auditory:   Denies visual changes, blurred vision/LOV Respiratory:   Denies SOB, DOE more than baseline levels.   Cardiovascular:   Denies chest pain, palpitations, new onset peripheral edema  Gastrointestinal:   Denies nausea, vomiting, diarrhea.  Genitourinary: Denies dysuria, freq/ urgency, flank pain or discharge from genitals.  Endocrine:     Denies hot or cold  intolerance, polyuria, polydipsia. Musculoskeletal:   Denies unexplained myalgias, joint swelling, unexplained arthralgias, gait problems.  Skin:  Denies rash, suspicious lesions Neurological:     Denies dizziness, unexplained weakness, numbness  Psychiatric/Behavioral:   Denies mood changes, suicidal or homicidal ideations, hallucinations    Objective:     Blood pressure 134/84, pulse 62, temperature 97.7 F (36.5 C), temperature source Oral, height 5\' 7"  (1.702 m), weight 172 lb (78 kg), SpO2 99 %. Body mass index is 26.94 kg/m. General Appearance:    Alert, cooperative, no distress, appears stated age  Head:    Normocephalic, without obvious abnormality, atraumatic  Eyes:    PERRL, conjunctiva/corneas clear, EOM's intact, fundi    benign, both eyes  Ears:    Normal TM's and external ear canals, both ears  Nose:   Nares normal, septum midline, mucosa normal, no drainage    or sinus tenderness  Throat:   Lips w/o lesion, mucosa moist, and tongue normal; teeth and   gums normal  Neck:   Supple, symmetrical, trachea midline, no adenopathy;    thyroid:  no enlargement/tenderness/nodules; no carotid   bruit or JVD  Back:     Symmetric, no curvature, ROM normal, no CVA tenderness  Lungs:     Clear to auscultation bilaterally, respirations unlabored, no       Wh/ R/ R  Chest Wall:    No tenderness or gross deformity; normal excursion   Heart:    Regular rate and rhythm, S1 and S2 normal, no murmur, rub   or gallop  Abdomen:    ventral hernia that is easily reducible.  Otherwise soft, non-tender, bowel sounds active all four quadrants, NO   G/R/R, no masses, no organomegaly  Genitalia:    Ext genitalia: without lesion, no penile rash or discharge, no hernias appreciated   Rectal:    Normal tone, prostate WNL's and equal b/l,  no tenderness; guaiac negative stool  Extremities:   Extremities normal, atraumatic, no cyanosis or gross edema  Pulses:   2+ and symmetric all extremities  Skin:    Warm, dry, Skin color, texture, turgor normal, no obvious rashes or lesions  M-Sk:   Ambulates * 4 w/o difficulty, no gross deformities, tone WNL  Neurologic:   CNII-XII intact, normal strength, sensation and reflexes    Throughout Psych:  No HI/SI, judgement and insight good, Euthymic mood. Full Affect.

## 2020-02-13 LAB — COMPREHENSIVE METABOLIC PANEL
ALT: 16 IU/L (ref 0–44)
AST: 20 IU/L (ref 0–40)
Albumin/Globulin Ratio: 1.7 (ref 1.2–2.2)
Albumin: 4.4 g/dL (ref 3.8–4.8)
Alkaline Phosphatase: 73 IU/L (ref 39–117)
BUN/Creatinine Ratio: 16 (ref 10–24)
BUN: 14 mg/dL (ref 8–27)
Bilirubin Total: 0.7 mg/dL (ref 0.0–1.2)
CO2: 27 mmol/L (ref 20–29)
Calcium: 9.2 mg/dL (ref 8.6–10.2)
Chloride: 97 mmol/L (ref 96–106)
Creatinine, Ser: 0.86 mg/dL (ref 0.76–1.27)
GFR calc Af Amer: 107 mL/min/{1.73_m2} (ref >59)
GFR calc non Af Amer: 93 mL/min/{1.73_m2} (ref >59)
Globulin, Total: 2.6 g/dL (ref 1.5–4.5)
Glucose: 76 mg/dL (ref 65–99)
Potassium: 3.6 mmol/L (ref 3.5–5.2)
Sodium: 137 mmol/L (ref 134–144)
Total Protein: 7 g/dL (ref 6.0–8.5)

## 2020-02-13 LAB — VITAMIN D 25 HYDROXY (VIT D DEFICIENCY, FRACTURES): Vit D, 25-Hydroxy: 34.2 ng/mL (ref 30.0–100.0)

## 2020-03-12 ENCOUNTER — Other Ambulatory Visit: Payer: Self-pay

## 2020-03-17 ENCOUNTER — Encounter: Payer: Self-pay | Admitting: Endocrinology

## 2020-03-17 ENCOUNTER — Other Ambulatory Visit: Payer: Self-pay

## 2020-03-17 ENCOUNTER — Ambulatory Visit: Payer: BC Managed Care – PPO | Admitting: Endocrinology

## 2020-03-17 VITALS — BP 110/80 | HR 67 | Ht 67.0 in | Wt 172.0 lb

## 2020-03-17 DIAGNOSIS — Z794 Long term (current) use of insulin: Secondary | ICD-10-CM

## 2020-03-17 DIAGNOSIS — E119 Type 2 diabetes mellitus without complications: Secondary | ICD-10-CM | POA: Diagnosis not present

## 2020-03-17 LAB — POCT GLYCOSYLATED HEMOGLOBIN (HGB A1C): Hemoglobin A1C: 8.2 % — AB (ref 4.0–5.6)

## 2020-03-17 MED ORDER — INSULIN NPH (HUMAN) (ISOPHANE) 100 UNIT/ML ~~LOC~~ SUSP: 70.0000 [IU] | SUBCUTANEOUS | 11 refills | Status: DC

## 2020-03-17 NOTE — Patient Instructions (Signed)
Please increase the insulin to 70 units each morning.   On this type of insulin schedule, you should eat meals on a regular schedule.  If a meal is missed or significantly delayed, your blood sugar could go low.   If it is good, please come back for a follow-up appointment in 2 months.   check your blood sugar twice a day.  vary the time of day when you check, between before the 3 meals, and at bedtime.  also check if you have symptoms of your blood sugar being too high or too low.  please keep a record of the readings and bring it to your next appointment here (or you can bring the meter itself).  You can write it on any piece of paper.  please call us sooner if your blood sugar goes below 70, or if you have a lot of readings over 200.

## 2020-03-17 NOTE — Progress Notes (Signed)
Subjective:    Patient ID: Tyler Wolf, male    DOB: 09/17/1956, 64 y.o.   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   DKA: never Severe hypoglycemia: never Pancreatitis: never Pancreatic imaging: normal on 2007 CT SDOH: due to missing insulin, he is not a candidate for multiple daily injections.   Other: he changed Lantus to NPH, due to pattern of cbg's.   Interval history: Pt says he rarely misses the insulin.  no cbg record, but states cbg varies from 135-180.  No recent steroids.  pt states he feels well in general.   Past Medical History:  Diagnosis Date  . DIABETES MELLITUS, TYPE II 05/17/2007  . DIVERTICULITIS, ACUTE 08/17/2010  . 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 HYPERLIPIDEMIA, MIXED 05/27/2007  . HYPERTENSION 05/17/2007  . TOBACCO ABUSE 05/27/2007    No past surgical history on file.  Social History   Socioeconomic History  . Marital status: Married    Spouse name: Not on file  . Number of children: Not on file  . Years of education: Not on file  . Highest education level: Not on file  Occupational History  . Not on file  Tobacco Use  . Smoking status: Current Every Day Smoker    Packs/day: 0.55    Years: 51.00    Pack years: 28.05  . Smokeless tobacco: Never Used  . Tobacco comment: electronic cigarettes, 6/1/2020pt states on and off  Substance and Sexual Activity  . Alcohol use: No    Alcohol/week: 0.0 standard drinks  . Drug use: No  . Sexual activity: Not Currently    Birth control/protection: None  Other Topics Concern  . Not on file  Social History Narrative  . Not on file   Social Determinants of Health   Financial Resource Strain:   . Difficulty of Paying Living Expenses:   Food Insecurity:   . Worried About Charity fundraiser in the Last Year:     . Arboriculturist in the Last Year:   Transportation Needs:   . Film/video editor (Medical):   Marland Kitchen Lack of Transportation (Non-Medical):   Physical Activity:   . Days of Exercise per Week:   . Minutes of Exercise per Session:   Stress:   . Feeling of Stress :   Social Connections:   . Frequency of Communication with Friends and Family:   . Frequency of Social Gatherings with Friends and Family:   . Attends Religious Services:   . Active Member of Clubs or Organizations:   . Attends Archivist Meetings:   Marland Kitchen Marital Status:   Intimate Partner Violence:   . Fear of Current or Ex-Partner:   . Emotionally Abused:   Marland Kitchen Physically Abused:   . Sexually Abused:     Current Outpatient Medications on File Prior to Visit  Medication Sig Dispense Refill  . aspirin 81 MG tablet Take 81 mg by mouth daily.      Marland Kitchen atenolol-chlorthalidone (TENORETIC) 50-25 MG tablet Take 1 tablet by mouth daily. 90 tablet 0  . atorvastatin (LIPITOR) 80 MG tablet TAKE 1 TABLET (80 MG TOTAL) BY MOUTH NIGHTLY 90 tablet 0  . glucose blood (CONTOUR NEXT TEST) test strip 1 each by Other route 2 (two) times daily. And lancets 2/day 200 each 3  .  Insulin Syringe-Needle U-100 (INSULIN SYRINGE 1CC/30GX5/16") 30G X 5/16" 1 ML MISC 1 Syringe by Does not apply route daily. Use to inject insulin daily 100 each 0  . lisinopril (ZESTRIL) 5 MG tablet TAKE 1 TABLET BY MOUTH DAILY. 90 tablet 0  . metFORMIN (GLUCOPHAGE) 1000 MG tablet TAKE 1 TABLET BY MOUTH TWICE A DAY 180 tablet 4   No current facility-administered medications on file prior to visit.    Allergies  Allergen Reactions  . Latex     REACTION: Rash    Family History  Problem Relation Age of Onset  . Diabetes Mother   . Heart attack Father   . Diabetes Father     BP 110/80   Pulse 67   Ht 5\' 7"  (1.702 m)   Wt 172 lb (78 kg)   SpO2 97%   BMI 26.94 kg/m    Review of Systems He denies hypoglycemia.     Objective:   Physical Exam VITAL  SIGNS:  See vs page GENERAL: no distress Pulses: dorsalis pedis intact bilat.   MSK: no deformity of the feet CV: no leg edema Skin:  no ulcer on the feet.  normal color and temp on the feet. Neuro: sensation is intact to touch on the feet Ext: there is bilateral onychomycosis of the toenails  Lab Results  Component Value Date   HGBA1C 8.2 (A) 03/17/2020       Assessment & Plan:  Insulin-requiring type 2 DM: he needs increased rx.  he declines to add another med  Patient Instructions  Please increase the insulin to 70 units each morning.   On this type of insulin schedule, you should eat meals on a regular schedule.  If a meal is missed or significantly delayed, your blood sugar could go low.   If it is good, please come back for a follow-up appointment in 2 months.   check your blood sugar twice a day.  vary the time of day when you check, between before the 3 meals, and at bedtime.  also check if you have symptoms of your blood sugar being too high or too low.  please keep a record of the readings and bring it to your next appointment here (or you can bring the meter itself).  You can write it on any piece of paper.  please call us sooner if your blood sugar goes below 70, or if you have a lot of readings over 200.

## 2020-05-05 ENCOUNTER — Encounter: Payer: Self-pay | Admitting: Endocrinology

## 2020-05-05 ENCOUNTER — Other Ambulatory Visit: Payer: Self-pay

## 2020-05-05 ENCOUNTER — Ambulatory Visit: Payer: BC Managed Care – PPO | Admitting: Endocrinology

## 2020-05-05 DIAGNOSIS — E119 Type 2 diabetes mellitus without complications: Secondary | ICD-10-CM

## 2020-05-05 DIAGNOSIS — Z794 Long term (current) use of insulin: Secondary | ICD-10-CM

## 2020-05-05 MED ORDER — INSULIN NPH (HUMAN) (ISOPHANE) 100 UNIT/ML ~~LOC~~ SUSP: 75.0000 [IU] | SUBCUTANEOUS | 11 refills | Status: DC

## 2020-05-05 NOTE — Patient Instructions (Addendum)
Please increase the insulin to 75 units each morning.   On this type of insulin schedule, you should eat meals on a regular schedule.  If a meal is missed or significantly delayed, your blood sugar could go low.   If it is good, please come back for a follow-up appointment in 2 months.   check your blood sugar twice a day.  vary the time of day when you check, between before the 3 meals, and at bedtime.  also check if you have symptoms of your blood sugar being too high or too low.  please keep a record of the readings and bring it to your next appointment here (or you can bring the meter itself).  You can write it on any piece of paper.  please call us sooner if your blood sugar goes below 70, or if you have a lot of readings over 200.

## 2020-05-05 NOTE — Progress Notes (Signed)
Subjective:    Patient ID: Tyler Wolf, male    DOB: 02/16/1956, 64 y.o.   MRN: [id number]  HPI Pt returns for f/u of diabetes mellitus: DM type: Insulin-requiring type 2 (but lean body habitus suggests he is developing type 1) Dx'ed: 9528 Complications: none Therapy: insulin since 2017, and metformin.   DKA: never Severe hypoglycemia: never Pancreatitis: never Pancreatic imaging: normal on 2007 CT SDOH: due to missing insulin, he is not a candidate for multiple daily injections.   Other: he changed Lantus to NPH, due to pattern of cbg's; he declines to add more non-insulin rx.  Interval history: Pt says he rarely misses the insulin.  no cbg record, but states cbg varies from 109-164.  No recent steroids.  pt states he feels well in general.   Past Medical History:  Diagnosis Date  . DIABETES MELLITUS, TYPE II 05/17/2007  . DIVERTICULITIS, ACUTE 08/17/2010  . 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 HYPERLIPIDEMIA, MIXED 05/27/2007  . HYPERTENSION 05/17/2007  . TOBACCO ABUSE 05/27/2007    History reviewed. No pertinent surgical history.  Social History   Socioeconomic History  . Marital status: Married    Spouse name: Not on file  . Number of children: Not on file  . Years of education: Not on file  . Highest education level: Not on file  Occupational History  . Not on file  Tobacco Use  . Smoking status: Current Every Day Smoker    Packs/day: 0.55    Years: 51.00    Pack years: 28.05  . Smokeless tobacco: Never Used  . Tobacco comment: electronic cigarettes, 6/1/2020pt states on and off  Vaping Use  . Vaping Use: Never used  Substance and Sexual Activity  . Alcohol use: No    Alcohol/week: 0.0 standard drinks  . Drug use: No  . Sexual activity: Not Currently    Birth control/protection: None  Other Topics Concern  . Not on file  Social History Narrative  . Not on file   Social Determinants of Health   Financial Resource Strain:   . Difficulty of Paying  Living Expenses:   Food Insecurity:   . Worried About Charity fundraiser in the Last Year:   . Arboriculturist in the Last Year:   Transportation Needs:   . Film/video editor (Medical):   Marland Kitchen Lack of Transportation (Non-Medical):   Physical Activity:   . Days of Exercise per Week:   . Minutes of Exercise per Session:   Stress:   . Feeling of Stress :   Social Connections:   . Frequency of Communication with Friends and Family:   . Frequency of Social Gatherings with Friends and Family:   . Attends Religious Services:   . Active Member of Clubs or Organizations:   . Attends Archivist Meetings:   Marland Kitchen Marital Status:   Intimate Partner Violence:   . Fear of Current or Ex-Partner:   . Emotionally Abused:   Marland Kitchen Physically Abused:   . Sexually Abused:     Current Outpatient Medications on File Prior to Visit  Medication Sig Dispense Refill  . aspirin 81 MG tablet Take 81 mg by mouth daily.      Marland Kitchen atenolol-chlorthalidone (TENORETIC) 50-25 MG tablet Take 1 tablet by mouth daily. 90 tablet 0  . atorvastatin (LIPITOR) 80 MG tablet TAKE 1 TABLET (80 MG TOTAL) BY MOUTH NIGHTLY 90 tablet 0  . glucose blood (CONTOUR NEXT TEST) test strip 1 each by  Other route 2 (two) times daily. And lancets 2/day 200 each 3  . Insulin Syringe-Needle U-100 (INSULIN SYRINGE 1CC/30GX5/16") 30G X 5/16" 1 ML MISC 1 Syringe by Does not apply route daily. Use to inject insulin daily 100 each 0  . lisinopril (ZESTRIL) 5 MG tablet TAKE 1 TABLET BY MOUTH DAILY. 90 tablet 0  . metFORMIN (GLUCOPHAGE) 1000 MG tablet TAKE 1 TABLET BY MOUTH TWICE A DAY 180 tablet 4   No current facility-administered medications on file prior to visit.    Allergies  Allergen Reactions  . Latex     REACTION: Rash    Family History  Problem Relation Age of Onset  . Diabetes Mother   . Heart attack Father   . Diabetes Father     BP 124/80 (BP Location: Left Arm, Patient Position: Sitting, Cuff Size: Normal)   Pulse 63    Ht 5\' 7"  (1.702 m)   Wt 174 lb 12.8 oz (79.3 kg)   SpO2 98%   BMI 27.38 kg/m    Review of Systems He denies hypoglycemia    Objective:   Physical Exam VITAL SIGNS:  See vs page GENERAL: no distress Pulses: dorsalis pedis intact bilat.   MSK: no deformity of the feet CV: no leg edema Skin:  no ulcer on the feet.  normal color and temp on the feet. Neuro: sensation is intact to touch on the feet  Lab Results  Component Value Date   HGBA1C 8.2 (A) 03/17/2020    Lab Results  Component Value Date   CREATININE 0.86 02/12/2020   BUN 14 02/12/2020   NA 137 02/12/2020   K 3.6 02/12/2020   CL 97 02/12/2020   CO2 27 02/12/2020        Assessment & Plan:  Insulin-requiring type 2 DM: he needs increased rx. he declines to add more non-insulin rx  Patient Instructions  Please increase the insulin to 75 units each morning.   On this type of insulin schedule, you should eat meals on a regular schedule.  If a meal is missed or significantly delayed, your blood sugar could go low.   If it is good, please come back for a follow-up appointment in 2 months.   check your blood sugar twice a day.  vary the time of day when you check, between before the 3 meals, and at bedtime.  also check if you have symptoms of your blood sugar being too high or too low.  please keep a record of the readings and bring it to your next appointment here (or you can bring the meter itself).  You can write it on any piece of paper.  please call us sooner if your blood sugar goes below 70, or if you have a lot of readings over 200.

## 2020-05-25 ENCOUNTER — Ambulatory Visit: Payer: BC Managed Care – PPO | Admitting: Endocrinology

## 2020-06-28 ENCOUNTER — Other Ambulatory Visit: Payer: Self-pay | Admitting: Physician Assistant

## 2020-06-28 ENCOUNTER — Telehealth: Payer: Self-pay | Admitting: Physician Assistant

## 2020-06-28 NOTE — Telephone Encounter (Signed)
Please call patient to schedule apt for further med refills.  ° °AS,CMA °

## 2020-07-23 ENCOUNTER — Other Ambulatory Visit: Payer: Self-pay

## 2020-07-23 ENCOUNTER — Encounter: Payer: Self-pay | Admitting: Endocrinology

## 2020-07-23 ENCOUNTER — Ambulatory Visit: Payer: BC Managed Care – PPO | Admitting: Endocrinology

## 2020-07-23 VITALS — BP 122/82 | HR 63 | Ht 67.0 in | Wt 179.0 lb

## 2020-07-23 DIAGNOSIS — Z794 Long term (current) use of insulin: Secondary | ICD-10-CM

## 2020-07-23 DIAGNOSIS — E119 Type 2 diabetes mellitus without complications: Secondary | ICD-10-CM | POA: Diagnosis not present

## 2020-07-23 LAB — POCT GLYCOSYLATED HEMOGLOBIN (HGB A1C): Hemoglobin A1C: 7.6 % — AB (ref 4.0–5.6)

## 2020-07-23 NOTE — Progress Notes (Signed)
Subjective:    Patient ID: Tyler Wolf, male    DOB: 1956-02-21, 64 y.o.   MRN: [id number]  HPI Pt returns for f/u of diabetes mellitus: DM type: Insulin-requiring type 2 (but lean body habitus suggests he is developing type 1) Dx'ed: 2482 Complications: none Therapy: insulin since 2017, and metformin.   DKA: never Severe hypoglycemia: never Pancreatitis: never Pancreatic imaging: normal on 2007 CT SDOH: due to missing insulin, he is not a candidate for multiple daily injections.   Other: he changed Lantus to NPH, due to pattern of cbg's; he declines to add more non-insulin rx.  Interval history: Pt says he seldom misses the insulin.  no cbg record, but states cbg varies from 102-172.  No recent steroids.  pt states he has tremor at 10AM, if he does not eat a snack by then.   Past Medical History:  Diagnosis Date  . DIABETES MELLITUS, TYPE II 05/17/2007  . DIVERTICULITIS, ACUTE 08/17/2010  . 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 HYPERLIPIDEMIA, MIXED 05/27/2007  . HYPERTENSION 05/17/2007  . TOBACCO ABUSE 05/27/2007    No past surgical history on file.  Social History   Socioeconomic History  . Marital status: Married    Spouse name: Not on file  . Number of children: Not on file  . Years of education: Not on file  . Highest education level: Not on file  Occupational History  . Not on file  Tobacco Use  . Smoking status: Current Every Day Smoker    Packs/day: 0.55    Years: 51.00    Pack years: 28.05  . Smokeless tobacco: Never Used  . Tobacco comment: electronic cigarettes, 6/1/2020pt states on and off  Vaping Use  . Vaping Use: Never used  Substance and Sexual Activity  . Alcohol use: No    Alcohol/week: 0.0 standard drinks  . Drug use: No  . Sexual activity: Not Currently    Birth control/protection: None  Other Topics Concern  . Not on file  Social History Narrative  . Not on file   Social Determinants of Health   Financial Resource Strain:   .  Difficulty of Paying Living Expenses: Not on file  Food Insecurity:   . Worried About Charity fundraiser in the Last Year: Not on file  . Ran Out of Food in the Last Year: Not on file  Transportation Needs:   . Lack of Transportation (Medical): Not on file  . Lack of Transportation (Non-Medical): Not on file  Physical Activity:   . Days of Exercise per Week: Not on file  . Minutes of Exercise per Session: Not on file  Stress:   . Feeling of Stress : Not on file  Social Connections:   . Frequency of Communication with Friends and Family: Not on file  . Frequency of Social Gatherings with Friends and Family: Not on file  . Attends Religious Services: Not on file  . Active Member of Clubs or Organizations: Not on file  . Attends Archivist Meetings: Not on file  . Marital Status: Not on file  Intimate Partner Violence:   . Fear of Current or Ex-Partner: Not on file  . Emotionally Abused: Not on file  . Physically Abused: Not on file  . Sexually Abused: Not on file    Current Outpatient Medications on File Prior to Visit  Medication Sig Dispense Refill  . aspirin 81 MG tablet Take 81 mg by mouth daily.      Marland Kitchen  atenolol-chlorthalidone (TENORETIC) 50-25 MG tablet Take 1 tablet by mouth daily. 90 tablet 0  . atorvastatin (LIPITOR) 80 MG tablet Take 1 tablet (80 mg total) by mouth daily. TAKE 1 TABLET (80 MG TOTAL) BY MOUTH NIGHTLY**NEEDS APT FOR FURTHER REFILLS** 60 tablet 0  . glucose blood (CONTOUR NEXT TEST) test strip 1 each by Other route 2 (two) times daily. And lancets 2/day 200 each 3  . insulin NPH Human (HUMULIN N) 100 UNIT/ML injection Inject 0.75 mLs (75 Units total) into the skin every morning. 20 mL 11  . Insulin Syringe-Needle U-100 (INSULIN SYRINGE 1CC/30GX5/16") 30G X 5/16" 1 ML MISC 1 Syringe by Does not apply route daily. Use to inject insulin daily 100 each 0  . lisinopril (ZESTRIL) 5 MG tablet TAKE 1 TABLET BY MOUTH DAILY. 90 tablet 0  . metFORMIN  (GLUCOPHAGE) 1000 MG tablet TAKE 1 TABLET BY MOUTH TWICE A DAY 180 tablet 4   No current facility-administered medications on file prior to visit.    Allergies  Allergen Reactions  . Latex     REACTION: Rash    Family History  Problem Relation Age of Onset  . Diabetes Mother   . Heart attack Father   . Diabetes Father     BP 122/82   Pulse 63   Ht 5\' 7"  (1.702 m)   Wt 179 lb (81.2 kg)   SpO2 97%   BMI 28.04 kg/m    Review of Systems     Objective:   Physical Exam VITAL SIGNS:  See vs page GENERAL: no distress Pulses: dorsalis pedis intact bilat.   MSK: no deformity of the feet CV: no leg edema Skin:  no ulcer on the feet.  normal color and temp on the feet. Neuro: sensation is intact to touch on the feet  A1c=7.6%     Assessment & Plan:  Type 2 DM Tremor, due to insulin: this limits aggressiveness of glycemic control  Patient Instructions  Please continue the same insulin. On this type of insulin schedule, you should eat meals on a regular schedule.  If a meal is missed or significantly delayed, your blood sugar could go low.   If it is good, please come back for a follow-up appointment in 4 months.   check your blood sugar twice a day.  vary the time of day when you check, between before the 3 meals, and at bedtime.  also check if you have symptoms of your blood sugar being too high or too low.  please keep a record of the readings and bring it to your next appointment here (or you can bring the meter itself).  You can write it on any piece of paper.  please call us sooner if your blood sugar goes below 70, or if you have a lot of readings over 200.

## 2020-07-23 NOTE — Patient Instructions (Addendum)
Please continue the same insulin.   On this type of insulin schedule, you should eat meals on a regular schedule.  If a meal is missed or significantly delayed, your blood sugar could go low.   If it is good, please come back for a follow-up appointment in 4 months.   check your blood sugar twice a day.  vary the time of day when you check, between before the 3 meals, and at bedtime.  also check if you have symptoms of your blood sugar being too high or too low.  please keep a record of the readings and bring it to your next appointment here (or you can bring the meter itself).  You can write it on any piece of paper.  please call us sooner if your blood sugar goes below 70, or if you have a lot of readings over 200.   

## 2020-10-28 ENCOUNTER — Other Ambulatory Visit: Payer: Self-pay | Admitting: Endocrinology

## 2020-10-28 ENCOUNTER — Other Ambulatory Visit: Payer: Self-pay | Admitting: Physician Assistant

## 2020-10-28 ENCOUNTER — Telehealth: Payer: Self-pay | Admitting: Physician Assistant

## 2020-10-28 DIAGNOSIS — I152 Hypertension secondary to endocrine disorders: Secondary | ICD-10-CM

## 2020-10-28 NOTE — Telephone Encounter (Signed)
Please call patient to schedule apt for med refills. AS, CMA °

## 2020-11-08 ENCOUNTER — Ambulatory Visit (INDEPENDENT_AMBULATORY_CARE_PROVIDER_SITE_OTHER): Payer: BC Managed Care – PPO | Admitting: Physician Assistant

## 2020-11-08 ENCOUNTER — Other Ambulatory Visit: Payer: Self-pay

## 2020-11-08 ENCOUNTER — Encounter: Payer: Self-pay | Admitting: Physician Assistant

## 2020-11-08 VITALS — BP 143/83 | HR 93 | Ht 67.0 in | Wt 181.7 lb

## 2020-11-08 DIAGNOSIS — E782 Mixed hyperlipidemia: Secondary | ICD-10-CM

## 2020-11-08 DIAGNOSIS — I152 Hypertension secondary to endocrine disorders: Secondary | ICD-10-CM

## 2020-11-08 DIAGNOSIS — E1159 Type 2 diabetes mellitus with other circulatory complications: Secondary | ICD-10-CM | POA: Diagnosis not present

## 2020-11-08 DIAGNOSIS — E1169 Type 2 diabetes mellitus with other specified complication: Secondary | ICD-10-CM

## 2020-11-08 DIAGNOSIS — Z794 Long term (current) use of insulin: Secondary | ICD-10-CM

## 2020-11-08 LAB — POCT UA - MICROALBUMIN
Albumin/Creatinine Ratio, Urine, POC: <30
Creatinine, POC: 200 mg/dL
Microalbumin Ur, POC: 30 mg/L

## 2020-11-08 LAB — POCT GLYCOSYLATED HEMOGLOBIN (HGB A1C): Hemoglobin A1C: 7.9 % — AB (ref 4.0–5.6)

## 2020-11-08 MED ORDER — ATORVASTATIN CALCIUM 80 MG PO TABS: ORAL_TABLET | ORAL | 1 refills | Status: DC

## 2020-11-08 MED ORDER — ATENOLOL-CHLORTHALIDONE 50-25 MG PO TABS: 1.0000 | ORAL_TABLET | Freq: Every day | ORAL | 1 refills | Status: DC

## 2020-11-08 NOTE — Patient Instructions (Signed)

## 2020-11-08 NOTE — Assessment & Plan Note (Addendum)
-  Last lipid panel: total cholesterol 130, triglycerides 117, HDL 36, LDL 73 -Continue current medication regimen. -Follow a heart healthy diet. -Will repeat lipid panel and hepatic function with CPE.

## 2020-11-08 NOTE — Assessment & Plan Note (Addendum)
-  BP above goal today. BP recheck about the same.  -Advised patient to start checking BP and pulse at home for the next two weeks and keep a log to drop by or call the office if BP consistently >140/90 for medication adjustments. Continue current medication regimen. -Continue low sodium diet and good hydration. -Will continue to monitor.

## 2020-11-08 NOTE — Progress Notes (Signed)
Established Patient Office Visit  Subjective:  Patient ID: Tyler Wolf, male    DOB: Dec 11, 1955  Age: 65 y.o. MRN: [id number]  CC:  Chief Complaint  Patient presents with  . Hypertension  . Diabetes    HPI Tyler Wolf presents for follow up on hypertension and diabetes mellitus.  HTN: Pt denies chest pain, palpitations, dizziness or lower extremity swelling. Taking medication as directed without side effects. Does not check BP at home. Pt follows a low salt diet. Tries to stay hydrated.  Diabetes mellitus: Followed by endocrinology. Pt denies increased urination or thirst. Pt reports medication compliance. No hypoglycemic events. Checking glucose at home. FBS average 135-140.  HLD: Pt taking medication as directed without issues. Denies side effects including myalgias and RUQ pain.    Past Medical History:  Diagnosis Date  . DIABETES MELLITUS, TYPE II 05/17/2007  . DIVERTICULITIS, ACUTE 08/17/2010  . 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 HYPERLIPIDEMIA, MIXED 05/27/2007  . HYPERTENSION 05/17/2007  . TOBACCO ABUSE 05/27/2007    History reviewed. No pertinent surgical history.  Family History  Problem Relation Age of Onset  . Diabetes Mother   . Heart attack Father   . Diabetes Father     Social History   Socioeconomic History  . Marital status: Married    Spouse name: Not on file  . Number of children: Not on file  . Years of education: Not on file  . Highest education level: Not on file  Occupational History  . Not on file  Tobacco Use  . Smoking status: Current Every Day Smoker    Packs/day: 0.55    Years: 51.00    Pack years: 28.05  . Smokeless tobacco: Never Used  . Tobacco comment: electronic cigarettes, 6/1/2020pt states on and off  Vaping Use  . Vaping Use: Never used  Substance and Sexual Activity  . Alcohol use: No    Alcohol/week: 0.0 standard drinks  . Drug use: No  . Sexual activity: Not Currently    Birth control/protection: None   Other Topics Concern  . Not on file  Social History Narrative  . Not on file   Social Determinants of Health   Financial Resource Strain: Not on file  Food Insecurity: Not on file  Transportation Needs: Not on file  Physical Activity: Not on file  Stress: Not on file  Social Connections: Not on file  Intimate Partner Violence: Not on file    Outpatient Medications Prior to Visit  Medication Sig Dispense Refill  . aspirin 81 MG tablet Take 81 mg by mouth daily.    Marland Kitchen glucose blood (CONTOUR NEXT TEST) test strip 1 each by Other route 2 (two) times daily. And lancets 2/day 200 each 3  . insulin NPH Human (HUMULIN N) 100 UNIT/ML injection Inject 0.75 mLs (75 Units total) into the skin every morning. 20 mL 11  . Insulin Syringe-Needle U-100 (INSULIN SYRINGE 1CC/30GX5/16") 30G X 5/16" 1 ML MISC 1 Syringe by Does not apply route daily. Use to inject insulin daily 100 each 0  . lisinopril (ZESTRIL) 5 MG tablet TAKE 1 TABLET BY MOUTH DAILY. 90 tablet 0  . metFORMIN (GLUCOPHAGE) 1000 MG tablet TAKE 1 TABLET BY MOUTH TWICE A DAY 180 tablet 4  . atenolol-chlorthalidone (TENORETIC) 50-25 MG tablet TAKE 1 TABLET BY MOUTH DAILY. 30 tablet 0  . atorvastatin (LIPITOR) 80 MG tablet TAKE 1 TABLET BY MOUTH DAILY. *NEED APPOINTMENT FOR FURTHER REFILLS* 30 tablet 0   No facility-administered medications  prior to visit.    Allergies  Allergen Reactions  . Latex     REACTION: Rash    ROS Review of Systems A fourteen system review of systems was performed and found to be positive as per HPI.  Objective:    Physical Exam General:  Well Developed, well nourished, in no acute distress Neuro:  Alert and oriented,  extra-ocular muscles intact  HEENT:  Normocephalic, atraumatic, neck supple Skin:  no gross rash, warm, pink. Cardiac:  RRR, S1 S2 wnl's Respiratory:  ECTA B/L, Not using accessory muscles, speaking in full sentences- unlabored. Vascular:  Ext warm, no cyanosis apprec.; cap RF less 2  sec. Psych:  No HI/SI, judgement and insight good, Euthymic mood. Full Affect.  BP (!) 143/83   Pulse 93   Ht 5\' 7"  (1.702 m)   Wt 181 lb 11.2 oz (82.4 kg)   SpO2 98%   BMI 28.46 kg/m  Wt Readings from Last 3 Encounters:  11/08/20 181 lb 11.2 oz (82.4 kg)  07/23/20 179 lb (81.2 kg)  05/05/20 174 lb 12.8 oz (79.3 kg)     Health Maintenance Due  Topic Date Due  . Hepatitis C Screening  Never done  . HIV Screening  Never done  . INFLUENZA VACCINE  05/16/2020  . COVID-19 Vaccine (3 - Booster for Pfizer series) 08/03/2020  . COLONOSCOPY (Pts 45-68yrs Insurance coverage will need to be confirmed)  09/26/2020    There are no preventive care reminders to display for this patient.  Lab Results  Component Value Date   TSH 2.190 06/18/2019   Lab Results  Component Value Date   WBC 7.5 06/18/2019   HGB 15.7 06/18/2019   HCT 44.9 06/18/2019   MCV 88 06/18/2019   PLT 256 06/18/2019   Lab Results  Component Value Date   NA 137 02/12/2020   K 3.6 02/12/2020   CO2 27 02/12/2020   GLUCOSE 76 02/12/2020   BUN 14 02/12/2020   CREATININE 0.86 02/12/2020   BILITOT 0.7 02/12/2020   ALKPHOS 73 02/12/2020   AST 20 02/12/2020   ALT 16 02/12/2020   PROT 7.0 02/12/2020   ALBUMIN 4.4 02/12/2020   CALCIUM 9.2 02/12/2020   GFR 103.03 07/01/2018   Lab Results  Component Value Date   CHOL 130 06/18/2019   Lab Results  Component Value Date   HDL 36 (L) 06/18/2019   Lab Results  Component Value Date   LDLCALC 73 06/18/2019   Lab Results  Component Value Date   TRIG 117 06/18/2019   Lab Results  Component Value Date   CHOLHDL 3.6 06/18/2019   Lab Results  Component Value Date   HGBA1C 7.9 (A) 11/08/2020      Assessment & Plan:   Problem List Items Addressed This Visit      Cardiovascular and Mediastinum   Hypertension associated with type 2 diabetes mellitus ([place])    -BP above goal today. BP recheck about the same.  -Advised patient to start checking BP and pulse  at home for the next two weeks and keep a log to drop by or call the office if BP consistently >140/90 for medication adjustments. Continue current medication regimen. -Continue low sodium diet and good hydration. -Will continue to monitor.      Relevant Medications   atenolol-chlorthalidone (TENORETIC) 50-25 MG tablet   atorvastatin (LIPITOR) 80 MG tablet     Endocrine   Diabetes ([place]) - Primary    -Followed by Endocrinology. -A1c today 7.9, stable. -  Continue current medication regimen. -Recommend to reduce carbohydrates and glucose. -Stay as active as possible. -UA microalbumin performed today, normal.      Relevant Medications   atenolol-chlorthalidone (TENORETIC) 50-25 MG tablet   atorvastatin (LIPITOR) 80 MG tablet   Other Relevant Orders   POCT glycosylated hemoglobin (Hb A1C) (Completed)   POCT UA - Microalbumin (Completed)   Mixed diabetic hyperlipidemia associated with type 2 diabetes mellitus ([facility])    -Last lipid panel: total cholesterol 130, triglycerides 117, HDL 36, LDL 73 -Continue current medication regimen. -Follow a heart healthy diet. -Will repeat lipid panel and hepatic function with CPE.      Relevant Medications   atenolol-chlorthalidone (TENORETIC) 50-25 MG tablet   atorvastatin (LIPITOR) 80 MG tablet      Meds ordered this encounter  Medications  . atenolol-chlorthalidone (TENORETIC) 50-25 MG tablet    Sig: Take 1 tablet by mouth daily.    Dispense:  90 tablet    Refill:  1    Order Specific Question:   Supervising Provider    Answer:   Beatrice Lecher D [[id number]]  . atorvastatin (LIPITOR) 80 MG tablet    Sig: TAKE 1 TABLET BY MOUTH DAILY.    Dispense:  90 tablet    Refill:  1    Order Specific Question:   Supervising Provider    Answer:   Beatrice Lecher D [[id number]]    Follow-up: Return in about 4 months (around 03/08/2021) for CPE and FBW 1 wk prior.    Lorrene Reid, PA-C

## 2020-11-08 NOTE — Assessment & Plan Note (Signed)
-  Followed by Endocrinology. -A1c today 7.9, stable. -Continue current medication regimen. -Recommend to reduce carbohydrates and glucose. -Stay as active as possible. -UA microalbumin performed today, normal.

## 2020-11-09 ENCOUNTER — Ambulatory Visit (INDEPENDENT_AMBULATORY_CARE_PROVIDER_SITE_OTHER): Payer: BC Managed Care – PPO | Admitting: Ophthalmology

## 2020-11-09 ENCOUNTER — Encounter (INDEPENDENT_AMBULATORY_CARE_PROVIDER_SITE_OTHER): Payer: Self-pay | Admitting: Ophthalmology

## 2020-11-09 DIAGNOSIS — H43823 Vitreomacular adhesion, bilateral: Secondary | ICD-10-CM

## 2020-11-09 DIAGNOSIS — E1165 Type 2 diabetes mellitus with hyperglycemia: Secondary | ICD-10-CM | POA: Diagnosis not present

## 2020-11-09 DIAGNOSIS — H2513 Age-related nuclear cataract, bilateral: Secondary | ICD-10-CM | POA: Diagnosis not present

## 2020-11-09 LAB — HM DIABETES EYE EXAM

## 2020-11-09 NOTE — Assessment & Plan Note (Signed)
No detectable diabetic retinopathy as of today  The patient has diabetes without any evidence of retinopathy. The patient advised to maintain good blood glucose control, excellent blood pressure control, and favorable levels of cholesterol, low density lipoprotein, and high density lipoproteins. Follow up in 1 year was recommended. Explained that fluctuations in visual acuity , or "out of focus", may result from large variations of blood sugar control.

## 2020-11-09 NOTE — Assessment & Plan Note (Signed)
Normal physiologic findings, not distorting to the macula observe

## 2020-11-09 NOTE — Progress Notes (Signed)
11/09/2020     CHIEF COMPLAINT Patient presents for Retina Follow Up (1 YR FU OU///Pt reports stable vision OU, pt denies any new F/F, no pain, or pressure OU. ////Last A1C: 7.9 taken yesterday//Last BS: 143 this AM )   HISTORY OF PRESENT ILLNESS: Tyler Wolf is a 65 y.o. male who presents to the clinic today for:   HPI    Retina Follow Up    Patient presents with  Diabetic Retinopathy.  In both eyes.  This started 1 year ago.  Duration of 1 year.  Since onset it is stable. Additional comments: 1 YR FU OU   Pt reports stable vision OU, pt denies any new F/F, no pain, or pressure OU.     Last A1C: 7.9 taken yesterday  Last BS: 143 this AM        Last edited by Nichola Sizer D on 11/09/2020  8:16 AM. (History)      Referring physician: No referring provider defined for this encounter.  HISTORICAL INFORMATION:   Selected notes from the MEDICAL RECORD NUMBER    Lab Results  Component Value Date   HGBA1C 7.9 (A) 11/08/2020     CURRENT MEDICATIONS: No current outpatient medications on file. (Ophthalmic Drugs)   No current facility-administered medications for this visit. (Ophthalmic Drugs)   Current Outpatient Medications (Other)  Medication Sig  . aspirin 81 MG tablet Take 81 mg by mouth daily.  Marland Kitchen atenolol-chlorthalidone (TENORETIC) 50-25 MG tablet Take 1 tablet by mouth daily.  Marland Kitchen atorvastatin (LIPITOR) 80 MG tablet TAKE 1 TABLET BY MOUTH DAILY.  Marland Kitchen glucose blood (CONTOUR NEXT TEST) test strip 1 each by Other route 2 (two) times daily. And lancets 2/day  . insulin NPH Human (HUMULIN N) 100 UNIT/ML injection Inject 0.75 mLs (75 Units total) into the skin every morning.  . Insulin Syringe-Needle U-100 (INSULIN SYRINGE 1CC/30GX5/16") 30G X 5/16" 1 ML MISC 1 Syringe by Does not apply route daily. Use to inject insulin daily  . lisinopril (ZESTRIL) 5 MG tablet TAKE 1 TABLET BY MOUTH DAILY.  . metFORMIN (GLUCOPHAGE) 1000 MG tablet TAKE 1 TABLET BY MOUTH TWICE A DAY    No current facility-administered medications for this visit. (Other)      REVIEW OF SYSTEMS:    ALLERGIES Allergies  Allergen Reactions  . Latex     REACTION: Rash    PAST MEDICAL HISTORY Past Medical History:  Diagnosis Date  . DIABETES MELLITUS, TYPE II 05/17/2007  . DIVERTICULITIS, ACUTE 08/17/2010  . 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 HYPERLIPIDEMIA, MIXED 05/27/2007  . HYPERTENSION 05/17/2007  . TOBACCO ABUSE 05/27/2007   History reviewed. No pertinent surgical history.  FAMILY HISTORY Family History  Problem Relation Age of Onset  . Diabetes Mother   . Heart attack Father   . Diabetes Father     SOCIAL HISTORY Social History   Tobacco Use  . Smoking status: Current Every Day Smoker    Packs/day: 0.55    Years: 51.00    Pack years: 28.05  . Smokeless tobacco: Never Used  . Tobacco comment: electronic cigarettes, 6/1/2020pt states on and off  Vaping Use  . Vaping Use: Never used  Substance Use Topics  . Alcohol use: No    Alcohol/week: 0.0 standard drinks  . Drug use: No         OPHTHALMIC EXAM: Base Eye Exam    Visual Acuity (ETDRS)      Right Left   Dist cc 20/20 20/20  Correction: Glasses       Tonometry (Tonopen, 8:20 AM)      Right Left   Pressure 18 20       Pupils      Pupils Dark Light Shape React APD   Right PERRL 3 3 Round Brisk None   Left PERRL 3 3 Round Brisk None       Visual Fields (Counting fingers)      Left Right    Full Full       Extraocular Movement      Right Left    Full Full       Neuro/Psych    Oriented x3: Yes       Dilation    Both eyes: 1.0% Mydriacyl, 2.5% Phenylephrine @ 8:20 AM        Slit Lamp and Fundus Exam    External Exam      Right Left   External Normal Normal       Slit Lamp Exam      Right Left   Lids/Lashes Normal Normal   Conjunctiva/Sclera White and quiet White and quiet   Cornea Clear Clear   Anterior Chamber Deep and quiet Deep and quiet   Iris Round and  reactive Round and reactive   Lens 1+ Nuclear sclerosis 1+ Nuclear sclerosis   Anterior Vitreous Normal Normal       Fundus Exam      Right Left   Posterior Vitreous Normal Normal   Disc Normal Normal   C/D Ratio 0.35 0.35   Macula Normal Normal   Vessels no DR no DR   Periphery Normal Normal          IMAGING AND PROCEDURES  Imaging and Procedures for 11/09/20  OCT, Retina - OU - Both Eyes       Right Eye Quality was good. Central Foveal Thickness: 241. Progression has been stable. Findings include normal foveal contour, vitreomacular adhesion .   Left Eye Quality was good. Scan locations included subfoveal. Central Foveal Thickness: 240. Progression has been stable. Findings include normal foveal contour, vitreomacular adhesion .   Notes Normal macular and foveal topography OU, physiologic vitreomacular adhesion noted                ASSESSMENT/PLAN:  Vitreomacular adhesion of both eyes Normal physiologic findings, not distorting to the macula observe  Poorly controlled diabetes mellitus ([place])- mgt per Endo; Dr Loanne Drilling No detectable diabetic retinopathy as of today  The patient has diabetes without any evidence of retinopathy. The patient advised to maintain good blood glucose control, excellent blood pressure control, and favorable levels of cholesterol, low density lipoprotein, and high density lipoproteins. Follow up in 1 year was recommended. Explained that fluctuations in visual acuity , or "out of focus", may result from large variations of blood sugar control.  Age-related nuclear cataract of both eyes The nature of cataract was discussed with the patient as well as the elective nature of surgery. The patient was reassured that surgery at a later date does not put the patient at risk for a worse outcome. It was emphasized that the need for surgery is dictated by the patient's quality of life as influenced by the cataract. Patient was instructed to maintain  close follow up with their general eye care doctor.  Age-appropriate, minor, no impact on acuity currently      ICD-10-CM   1. Vitreomacular adhesion of both eyes  H43.823 OCT, Retina - OU - Both Eyes  2.  Poorly controlled diabetes mellitus ([place])- mgt per Endo; Dr Loanne Drilling  E11.65   3. Age-related nuclear cataract of both eyes  H25.13     1.  Excellent blood sugar control reviewed, and its ability to slow progression or development of diabetic retinopathy.  2.  No active findings OU as of today.  3.  Ophthalmic Meds Ordered this visit:  No orders of the defined types were placed in this encounter.      Return in about 1 year (around 11/09/2021) for DILATE OU, COLOR FP, OCT.  There are no Patient Instructions on file for this visit.   Explained the diagnoses, plan, and follow up with the patient and they expressed understanding.  Patient expressed understanding of the importance of proper follow up care.   Clent Demark Sol Englert M.D. Diseases & Surgery of the Retina and Vitreous [facility] [place] 11/09/20     Abbreviations: M myopia (nearsighted); A astigmatism; H hyperopia (farsighted); P presbyopia; Mrx spectacle prescription;  CTL contact lenses; OD right eye; OS left eye; OU both eyes  XT exotropia; ET esotropia; PEK punctate epithelial keratitis; PEE punctate epithelial erosions; DES dry eye syndrome; MGD meibomian gland dysfunction; ATs artificial tears; PFAT's preservative free artificial tears; [place] nuclear sclerotic cataract; PSC posterior subcapsular cataract; ERM epi-retinal membrane; PVD posterior vitreous detachment; RD retinal detachment; DM diabetes mellitus; DR diabetic retinopathy; NPDR non-proliferative diabetic retinopathy; PDR proliferative diabetic retinopathy; CSME clinically significant macular edema; DME diabetic macular edema; dbh dot blot hemorrhages; CWS cotton wool spot; POAG primary open angle glaucoma; C/D cup-to-disc ratio; HVF humphrey visual  field; GVF goldmann visual field; OCT optical coherence tomography; IOP intraocular pressure; BRVO Branch retinal vein occlusion; CRVO central retinal vein occlusion; CRAO central retinal artery occlusion; BRAO branch retinal artery occlusion; RT retinal tear; SB scleral buckle; PPV pars plana vitrectomy; VH Vitreous hemorrhage; PRP panretinal laser photocoagulation; IVK intravitreal kenalog; VMT vitreomacular traction; MH Macular hole;  NVD neovascularization of the disc; NVE neovascularization elsewhere; AREDS age related eye disease study; ARMD age related macular degeneration; POAG primary open angle glaucoma; EBMD epithelial/anterior basement membrane dystrophy; ACIOL anterior chamber intraocular lens; IOL intraocular lens; PCIOL posterior chamber intraocular lens; Phaco/IOL phacoemulsification with intraocular lens placement; [place] photorefractive keratectomy; LASIK laser assisted in situ keratomileusis; HTN hypertension; DM diabetes mellitus; COPD chronic obstructive pulmonary disease

## 2020-11-09 NOTE — Assessment & Plan Note (Signed)
The nature of cataract was discussed with the patient as well as the elective nature of surgery. The patient was reassured that surgery at a later date does not put the patient at risk for a worse outcome. It was emphasized that the need for surgery is dictated by the patient's quality of life as influenced by the cataract. Patient was instructed to maintain close follow up with their general eye care doctor.  Age-appropriate, minor, no impact on acuity currently

## 2020-11-19 ENCOUNTER — Other Ambulatory Visit: Payer: Self-pay

## 2020-11-23 ENCOUNTER — Other Ambulatory Visit: Payer: Self-pay

## 2020-11-23 ENCOUNTER — Encounter: Payer: Self-pay | Admitting: Endocrinology

## 2020-11-23 ENCOUNTER — Ambulatory Visit: Payer: BC Managed Care – PPO | Admitting: Endocrinology

## 2020-11-23 VITALS — BP 128/86 | HR 60 | Ht 67.0 in | Wt 183.0 lb

## 2020-11-23 DIAGNOSIS — E119 Type 2 diabetes mellitus without complications: Secondary | ICD-10-CM | POA: Diagnosis not present

## 2020-11-23 DIAGNOSIS — Z794 Long term (current) use of insulin: Secondary | ICD-10-CM | POA: Diagnosis not present

## 2020-11-23 LAB — POCT GLYCOSYLATED HEMOGLOBIN (HGB A1C): Hemoglobin A1C: 7.7 % — AB (ref 4.0–5.6)

## 2020-11-23 NOTE — Progress Notes (Unsigned)
Subjective:    Patient ID: Tyler Wolf, male    DOB: 1956-01-13, 65 y.o.   MRN: [id number]  HPI Pt returns for f/u of diabetes mellitus: DM type: Insulin-requiring type 2 (but lean body habitus suggests he is developing type 1).   Dx'ed: 1914 Complications: none Therapy: insulin since 2017, and metformin.   DKA: never Severe hypoglycemia: never Pancreatitis: never Pancreatic imaging: normal on 2007 CT SDOH: due to missing insulin, he is not a candidate for multiple daily injections.   Other: he changed Lantus to NPH, due to pattern of cbg's; he declines to add more non-insulin rx.  Interval history: Pt says he occasionally misses the insulin.  no cbg record, but states cbg varies from 109-182.  No recent steroids.  pt states he has tremor whenever a meal is missed or delayed.  Eating helps.    Past Medical History:  Diagnosis Date  . DIABETES MELLITUS, TYPE II 05/17/2007  . DIVERTICULITIS, ACUTE 08/17/2010  . 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 HYPERLIPIDEMIA, MIXED 05/27/2007  . HYPERTENSION 05/17/2007  . TOBACCO ABUSE 05/27/2007    No past surgical history on file.  Social History   Socioeconomic History  . Marital status: Married    Spouse name: Not on file  . Number of children: Not on file  . Years of education: Not on file  . Highest education level: Not on file  Occupational History  . Not on file  Tobacco Use  . Smoking status: Current Every Day Smoker    Packs/day: 0.55    Years: 51.00    Pack years: 28.05  . Smokeless tobacco: Never Used  . Tobacco comment: electronic cigarettes, 6/1/2020pt states on and off  Vaping Use  . Vaping Use: Never used  Substance and Sexual Activity  . Alcohol use: No    Alcohol/week: 0.0 standard drinks  . Drug use: No  . Sexual activity: Not Currently    Birth control/protection: None  Other Topics Concern  . Not on file  Social History Narrative  . Not on file   Social Determinants of Health   Financial Resource  Strain: Not on file  Food Insecurity: Not on file  Transportation Needs: Not on file  Physical Activity: Not on file  Stress: Not on file  Social Connections: Not on file  Intimate Partner Violence: Not on file    Current Outpatient Medications on File Prior to Visit  Medication Sig Dispense Refill  . aspirin 81 MG tablet Take 81 mg by mouth daily.    Marland Kitchen atenolol-chlorthalidone (TENORETIC) 50-25 MG tablet Take 1 tablet by mouth daily. 90 tablet 1  . atorvastatin (LIPITOR) 80 MG tablet TAKE 1 TABLET BY MOUTH DAILY. 90 tablet 1  . glucose blood (CONTOUR NEXT TEST) test strip 1 each by Other route 2 (two) times daily. And lancets 2/day 200 each 3  . insulin NPH Human (HUMULIN N) 100 UNIT/ML injection Inject 0.75 mLs (75 Units total) into the skin every morning. 20 mL 11  . Insulin Syringe-Needle U-100 (INSULIN SYRINGE 1CC/30GX5/16") 30G X 5/16" 1 ML MISC 1 Syringe by Does not apply route daily. Use to inject insulin daily 100 each 0  . lisinopril (ZESTRIL) 5 MG tablet TAKE 1 TABLET BY MOUTH DAILY. 90 tablet 0  . metFORMIN (GLUCOPHAGE) 1000 MG tablet TAKE 1 TABLET BY MOUTH TWICE A DAY 180 tablet 4   No current facility-administered medications on file prior to visit.    Allergies  Allergen Reactions  . Latex  REACTION: Rash    Family History  Problem Relation Age of Onset  . Diabetes Mother   . Heart attack Father   . Diabetes Father     BP 128/86   Pulse 60   Ht 5\' 7"  (1.702 m)   Wt 183 lb (83 kg)   SpO2 99%   BMI 28.66 kg/m    Review of Systems Denies LOC    Objective:   Physical Exam VITAL SIGNS:  See vs page GENERAL: no distress Pulses: dorsalis pedis intact bilat.   MSK: no deformity of the feet CV: no leg edema Skin:  no ulcer on the feet.  normal color and temp on the feet.   Neuro: sensation is intact to touch on the feet.  Ext: there is bilateral onychomycosis of the toenails   Lab Results  Component Value Date   HGBA1C 7.9 (A) 11/08/2020        Assessment & Plan:  Insulin-requiring type 2 DM Tremor, due to insulin.  this limits aggressiveness of glycemic control.  Patient Instructions  Please continue the same insulin.   On this type of insulin schedule, you should eat meals on a regular schedule.  If a meal is missed or significantly delayed, your blood sugar could go low.   If it is good, please come back for a follow-up appointment in 4 months.   check your blood sugar twice a day.  vary the time of day when you check, between before the 3 meals, and at bedtime.  also check if you have symptoms of your blood sugar being too high or too low.  please keep a record of the readings and bring it to your next appointment here (or you can bring the meter itself).  You can write it on any piece of paper.  please call us sooner if your blood sugar goes below 70, or if you have a lot of readings over 200.

## 2020-11-23 NOTE — Patient Instructions (Addendum)
Please continue the same insulin.   On this type of insulin schedule, you should eat meals on a regular schedule.  If a meal is missed or significantly delayed, your blood sugar could go low.   If it is good, please come back for a follow-up appointment in 4 months.   check your blood sugar twice a day.  vary the time of day when you check, between before the 3 meals, and at bedtime.  also check if you have symptoms of your blood sugar being too high or too low.  please keep a record of the readings and bring it to your next appointment here (or you can bring the meter itself).  You can write it on any piece of paper.  please call us sooner if your blood sugar goes below 70, or if you have a lot of readings over 200.

## 2020-12-03 ENCOUNTER — Encounter: Payer: Self-pay | Admitting: Gastroenterology

## 2021-02-10 ENCOUNTER — Other Ambulatory Visit: Payer: Self-pay

## 2021-02-10 ENCOUNTER — Ambulatory Visit (AMBULATORY_SURGERY_CENTER): Payer: Self-pay | Admitting: *Deleted

## 2021-02-10 VITALS — Ht 67.0 in | Wt 180.0 lb

## 2021-02-10 DIAGNOSIS — Z1211 Encounter for screening for malignant neoplasm of colon: Secondary | ICD-10-CM

## 2021-02-10 MED ORDER — PEG 3350-KCL-NA BICARB-NACL 420 G PO SOLR: 4000.0000 mL | Freq: Once | ORAL | 0 refills | Status: AC

## 2021-02-24 ENCOUNTER — Ambulatory Visit (AMBULATORY_SURGERY_CENTER): Payer: BC Managed Care – PPO | Admitting: Gastroenterology

## 2021-02-24 ENCOUNTER — Other Ambulatory Visit: Payer: Self-pay

## 2021-02-24 ENCOUNTER — Encounter: Payer: Self-pay | Admitting: Gastroenterology

## 2021-02-24 VITALS — BP 121/82 | HR 62 | Temp 98.0°F | Resp 22 | Ht 67.0 in | Wt 180.0 lb

## 2021-02-24 DIAGNOSIS — D124 Benign neoplasm of descending colon: Secondary | ICD-10-CM | POA: Diagnosis not present

## 2021-02-24 DIAGNOSIS — Z1211 Encounter for screening for malignant neoplasm of colon: Secondary | ICD-10-CM

## 2021-02-24 MED ORDER — SODIUM CHLORIDE 0.9 % IV SOLN: 500.0000 mL | Freq: Once | INTRAVENOUS | Status: DC

## 2021-02-24 NOTE — Patient Instructions (Signed)
Await pathology  Please read over handout about polyps, diverticulosis and hemorrhoids  Continue your normal medications  YOU HAD AN ENDOSCOPIC PROCEDURE TODAY AT [facility]:   Refer to the procedure report that was given to you for any specific questions about what was found during the examination.  If the procedure report does not answer your questions, please call your gastroenterologist to clarify.  If you requested that your care partner not be given the details of your procedure findings, then the procedure report has been included in a sealed envelope for you to review at your convenience later.  YOU SHOULD EXPECT: Some feelings of bloating in the abdomen. Passage of more gas than usual.  Walking can help get rid of the air that was put into your GI tract during the procedure and reduce the bloating. If you had a lower endoscopy (such as a colonoscopy or flexible sigmoidoscopy) you may notice spotting of blood in your stool or on the toilet paper. If you underwent a bowel prep for your procedure, you may not have a normal bowel movement for a few days.  Please Note:  You might notice some irritation and congestion in your nose or some drainage.  This is from the oxygen used during your procedure.  There is no need for concern and it should clear up in a day or so.  SYMPTOMS TO REPORT IMMEDIATELY:   Following lower endoscopy (colonoscopy or flexible sigmoidoscopy):  Excessive amounts of blood in the stool  Significant tenderness or worsening of abdominal pains  Swelling of the abdomen that is new, acute  Fever of 100F or higher  For urgent or emergent issues, a gastroenterologist can be reached at any hour by calling [phone]. Do not use MyChart messaging for urgent concerns.    DIET:  We do recommend a small meal at first, but then you may proceed to your regular diet.  Drink plenty of fluids but you should avoid alcoholic beverages for 24 hours.  ACTIVITY:   You should plan to take it easy for the rest of today and you should NOT DRIVE or use heavy machinery until tomorrow (because of the sedation medicines used during the test).    FOLLOW UP: Our staff will call the number listed on your records 48-72 hours following your procedure to check on you and address any questions or concerns that you may have regarding the information given to you following your procedure. If we do not reach you, we will leave a message.  We will attempt to reach you two times.  During this call, we will ask if you have developed any symptoms of COVID 19. If you develop any symptoms (ie: fever, flu-like symptoms, shortness of breath, cough etc.) before then, please call [phone].  If you test positive for Covid 19 in the 2 weeks post procedure, please call and report this information to [place].    If any biopsies were taken you will be contacted by phone or by letter within the next 1-3 weeks.  Please call us at [phone] if you have not heard about the biopsies in 3 weeks.    SIGNATURES/CONFIDENTIALITY: You and/or your care partner have signed paperwork which will be entered into your electronic medical record.  These signatures attest to the fact that that the information above on your After Visit Summary has been reviewed and is understood.  Full responsibility of the confidentiality of this discharge information lies with you and/or your care-partner.

## 2021-02-24 NOTE — Progress Notes (Signed)
Report given to PACU, vss 

## 2021-02-24 NOTE — Progress Notes (Signed)
Called to room to assist during endoscopic procedure.  Patient ID and intended procedure confirmed with present staff. Received instructions for my participation in the procedure from the performing physician.  

## 2021-02-24 NOTE — Op Note (Signed)
[facility] Patient Name: Tyler Wolf Procedure Date: 02/24/2021 7:53 AM MRN: [id number] Endoscopist: Sherilyn Cooter L. Myrtie Neither , MD Age: 65 Referring MD:  Date of Birth: [birth year] Gender: Male Account #: [id number] Procedure:                Colonoscopy Indications:              Screening for colorectal malignant neoplasm Medicines:                Monitored Anesthesia Care Procedure:                Pre-Anesthesia Assessment:                           - Prior to the procedure, a History and Physical                            was performed, and patient medications and                            allergies were reviewed. The patient's tolerance of                            previous anesthesia was also reviewed. The risks                            and benefits of the procedure and the sedation                            options and risks were discussed with the patient.                            All questions were answered, and informed consent                            was obtained. Prior Anticoagulants: The patient has                            taken no previous anticoagulant or antiplatelet                            agents. ASA Grade Assessment: II - A patient with                            mild systemic disease. After reviewing the risks                            and benefits, the patient was deemed in                            satisfactory condition to undergo the procedure.                           After obtaining informed consent, the colonoscope  was passed under direct vision. Throughout the                            procedure, the patient's blood pressure, pulse, and                            oxygen saturations were monitored continuously. The                            Colonoscope was introduced through the anus and                            advanced to the the cecum, identified by                            appendiceal orifice and  ileocecal valve. The                            colonoscopy was performed without difficulty. The                            patient tolerated the procedure well. The quality                            of the bowel preparation was good. The ileocecal                            valve, appendiceal orifice, and rectum were                            photographed. The bowel preparation used was                            GoLYTELY. Scope In: 8:05:13 AM Scope Out: 8:19:50 AM Scope Withdrawal Time: 0 hours 12 minutes 51 seconds  Total Procedure Duration: 0 hours 14 minutes 37 seconds  Findings:                 The perianal and digital rectal examinations were                            normal.                           A 6 mm polyp was found in the descending colon. The                            polyp was sessile. The polyp was removed with a                            cold snare. Resection and retrieval were complete.                           Multiple small-mouthed diverticula were found in  the left colon.                           Internal hemorrhoids were found. The hemorrhoids                            were small.                           The exam was otherwise without abnormality on                            direct and retroflexion views. Complications:            No immediate complications. Estimated Blood Loss:     Estimated blood loss was minimal. Impression:               - One 6 mm polyp in the descending colon, removed                            with a cold snare. Resected and retrieved.                           - Diverticulosis in the left colon.                           - Internal hemorrhoids.                           - The examination was otherwise normal on direct                            and retroflexion views. Recommendation:           - Patient has a contact number available for                            emergencies. The signs and  symptoms of potential                            delayed complications were discussed with the                            patient. Return to normal activities tomorrow.                            Written discharge instructions were provided to the                            patient.                           - Resume previous diet.                           - Continue present medications.                           -  Await pathology results.                           - Repeat colonoscopy is recommended for                            surveillance. The colonoscopy date will be                            determined after pathology results from today's                            exam become available for review. Chelle Cayton L. Loletha Carrow, MD 02/24/2021 8:23:27 AM This report has been signed electronically.

## 2021-02-28 ENCOUNTER — Telehealth: Payer: Self-pay

## 2021-02-28 NOTE — Telephone Encounter (Signed)
LVM

## 2021-03-01 ENCOUNTER — Other Ambulatory Visit: Payer: Self-pay | Admitting: Physician Assistant

## 2021-03-01 DIAGNOSIS — E559 Vitamin D deficiency, unspecified: Secondary | ICD-10-CM

## 2021-03-01 DIAGNOSIS — E1159 Type 2 diabetes mellitus with other circulatory complications: Secondary | ICD-10-CM

## 2021-03-01 DIAGNOSIS — Z0000 Encounter for general adult medical examination without abnormal findings: Secondary | ICD-10-CM

## 2021-03-01 DIAGNOSIS — E782 Mixed hyperlipidemia: Secondary | ICD-10-CM

## 2021-03-01 DIAGNOSIS — I152 Hypertension secondary to endocrine disorders: Secondary | ICD-10-CM

## 2021-03-01 DIAGNOSIS — E1169 Type 2 diabetes mellitus with other specified complication: Secondary | ICD-10-CM

## 2021-03-04 ENCOUNTER — Other Ambulatory Visit: Payer: BC Managed Care – PPO

## 2021-03-04 ENCOUNTER — Other Ambulatory Visit: Payer: Self-pay

## 2021-03-04 DIAGNOSIS — Z794 Long term (current) use of insulin: Secondary | ICD-10-CM | POA: Diagnosis not present

## 2021-03-04 DIAGNOSIS — I152 Hypertension secondary to endocrine disorders: Secondary | ICD-10-CM

## 2021-03-04 DIAGNOSIS — E559 Vitamin D deficiency, unspecified: Secondary | ICD-10-CM

## 2021-03-04 DIAGNOSIS — E1159 Type 2 diabetes mellitus with other circulatory complications: Secondary | ICD-10-CM

## 2021-03-04 DIAGNOSIS — E782 Mixed hyperlipidemia: Secondary | ICD-10-CM | POA: Diagnosis not present

## 2021-03-04 DIAGNOSIS — E1169 Type 2 diabetes mellitus with other specified complication: Secondary | ICD-10-CM | POA: Diagnosis not present

## 2021-03-04 DIAGNOSIS — Z0000 Encounter for general adult medical examination without abnormal findings: Secondary | ICD-10-CM

## 2021-03-05 LAB — LIPID PANEL
Chol/HDL Ratio: 3.3 ratio (ref 0.0–5.0)
Cholesterol, Total: 137 mg/dL (ref 100–199)
HDL: 41 mg/dL (ref >39)
LDL Chol Calc (NIH): 75 mg/dL (ref 0–99)
Triglycerides: 115 mg/dL (ref 0–149)
VLDL Cholesterol Cal: 21 mg/dL (ref 5–40)

## 2021-03-05 LAB — COMPREHENSIVE METABOLIC PANEL
ALT: 16 IU/L (ref 0–44)
AST: 13 IU/L (ref 0–40)
Albumin/Globulin Ratio: 1.7 (ref 1.2–2.2)
Albumin: 4.6 g/dL (ref 3.8–4.8)
Alkaline Phosphatase: 80 IU/L (ref 44–121)
BUN/Creatinine Ratio: 24 (ref 10–24)
BUN: 20 mg/dL (ref 8–27)
Bilirubin Total: 0.5 mg/dL (ref 0.0–1.2)
CO2: 25 mmol/L (ref 20–29)
Calcium: 9.6 mg/dL (ref 8.6–10.2)
Chloride: 96 mmol/L (ref 96–106)
Creatinine, Ser: 0.85 mg/dL (ref 0.76–1.27)
Globulin, Total: 2.7 g/dL (ref 1.5–4.5)
Glucose: 180 mg/dL — ABNORMAL HIGH (ref 65–99)
Potassium: 4.3 mmol/L (ref 3.5–5.2)
Sodium: 138 mmol/L (ref 134–144)
Total Protein: 7.3 g/dL (ref 6.0–8.5)
eGFR: 98 mL/min/{1.73_m2} (ref >59)

## 2021-03-05 LAB — CBC
Hematocrit: 50.5 % (ref 37.5–51.0)
Hemoglobin: 16.5 g/dL (ref 13.0–17.7)
MCH: 30.9 pg (ref 26.6–33.0)
MCHC: 32.7 g/dL (ref 31.5–35.7)
MCV: 95 fL (ref 79–97)
Platelets: 244 10*3/uL (ref 150–450)
RBC: 5.34 x10E6/uL (ref 4.14–5.80)
RDW: 13.4 % (ref 11.6–15.4)
WBC: 7.9 10*3/uL (ref 3.4–10.8)

## 2021-03-05 LAB — TSH: TSH: 2.11 u[IU]/mL (ref 0.450–4.500)

## 2021-03-05 LAB — HEMOGLOBIN A1C
Est. average glucose Bld gHb Est-mCnc: 189 mg/dL
Hgb A1c MFr Bld: 8.2 % — ABNORMAL HIGH (ref 4.8–5.6)

## 2021-03-05 LAB — VITAMIN D 25 HYDROXY (VIT D DEFICIENCY, FRACTURES): Vit D, 25-Hydroxy: 19.9 ng/mL — ABNORMAL LOW (ref 30.0–100.0)

## 2021-03-08 ENCOUNTER — Other Ambulatory Visit: Payer: Self-pay

## 2021-03-08 ENCOUNTER — Encounter: Payer: Self-pay | Admitting: Physician Assistant

## 2021-03-08 ENCOUNTER — Ambulatory Visit (INDEPENDENT_AMBULATORY_CARE_PROVIDER_SITE_OTHER): Payer: BC Managed Care – PPO | Admitting: Physician Assistant

## 2021-03-08 VITALS — BP 119/70 | HR 60 | Temp 99.3°F | Ht 67.0 in | Wt 180.4 lb

## 2021-03-08 DIAGNOSIS — Z794 Long term (current) use of insulin: Secondary | ICD-10-CM

## 2021-03-08 DIAGNOSIS — E782 Mixed hyperlipidemia: Secondary | ICD-10-CM

## 2021-03-08 DIAGNOSIS — E559 Vitamin D deficiency, unspecified: Secondary | ICD-10-CM | POA: Diagnosis not present

## 2021-03-08 DIAGNOSIS — E1169 Type 2 diabetes mellitus with other specified complication: Secondary | ICD-10-CM

## 2021-03-08 DIAGNOSIS — Z716 Tobacco abuse counseling: Secondary | ICD-10-CM

## 2021-03-08 DIAGNOSIS — L720 Epidermal cyst: Secondary | ICD-10-CM

## 2021-03-08 DIAGNOSIS — Z0000 Encounter for general adult medical examination without abnormal findings: Secondary | ICD-10-CM | POA: Diagnosis not present

## 2021-03-08 NOTE — Progress Notes (Signed)
Male physical   Impression and Recommendations:    1. Healthcare maintenance   2. Type 2 diabetes mellitus with other specified complication, with long-term current use of insulin ([place])   3. Vitamin D deficiency   4. Mixed diabetic hyperlipidemia associated with type 2 diabetes mellitus (HCC)   5. Epidermal cyst   6. Tobacco abuse counseling      1) Anticipatory Guidance: Skin CA prevention-recommend to use sunscreen when outside along with skin surveillance; eat a balanced and modest diet; physical activity at least 25 minutes per day or minimum of 150 min/ week moderate to intense activity.  2) Immunizations / Screenings / Labs:   All immunizations are up-to-date per recommendations or will be updated today if pt allows.    - Patient understands with dental and vision screens they will schedule independently.  -Obtained CBC, CMP, HgA1c, Lipid panel, TSH and vit D when fasting.  Most labs are essentially within normal limits or stable from prior with the exception of A1c and vitamin D. -UTD on immunizations, colonoscopy.    3) Weight: Recommend to continue to improve diet habits to improve overall feelings of well being and objective health data. Improve nutrient density of diet through increasing intake of fruits and vegetables and decreasing saturated fats, white flour products and refined sugars.   4) Healthcare maintenance: -Continue to follow-up with endocrinology for diabetes management.  Reduce carbohydrates and glucose intake. -Encouraged to reduce tobacco use and eventually quit. Discussed pharmacologic therapy available.  -Patient declined vitamin D 50,000 units once a week, advised to start taking vitamin D3 5000 units daily. -Stay well hydrated. -BP and pulse wnl's. -Follow-up in 4 months for DM, HTN, HLD   No orders of the defined types were placed in this encounter.   No orders of the defined types were placed in this encounter.    Return in about 4  months (around 07/09/2021) for DM, HTN, HLD.    Gross side effects, risk and benefits, and alternatives of medications discussed with patient.  Patient is aware that all medications have potential side effects and we are unable to predict every side effect or drug-drug interaction that may occur.  Expresses verbal understanding and consents to current therapy plan and treatment regimen.  Please see AVS handed out to patient at the end of our visit for further patient instructions/ counseling done pertaining to today's office visit.  Note:  This note was prepared with assistance of Dragon voice recognition software. Occasional wrong-word or sound-a-like substitutions may have occurred due to the inherent limitations of voice recognition software.      Subjective:        CC: CPE   HPI: Tyler Wolf is a 65 y.o. male who presents to [place] at [facility] today for a yearly health maintenance exam.     Health Maintenance Summary  - Reviewed and updated, unless pt declines services.  Last Cologuard or Colonoscopy: 02/24/2021, tubular adenoma Family history of Colon CA: N  Tobacco History Reviewed:   Yes, current smoker, 8 to 10 cigarettes/day.  In the past has quit multiple times and was successful with Chantix. Abdominal Ultrasound:   N/A CT scan for screening lung CA: N/A, 28-pack-year history Alcohol / drug use:    No concerns, no excessive use / no use Dental Home: Yes Eye exams: Yes Male history: STD concerns:   none Additional penile/ urinary concerns: None   Additional concerns beyond Health Maintenance issues:  None    Immunization History  Administered Date(s) Administered  . Influenza Split 08/10/2011, 08/12/2012  . Influenza Whole 10/16/2001, 07/23/2008, 07/30/2009, 08/02/2010  . Influenza,inj,Quad PF,6+ Mos 08/13/2013, 06/11/2014, 06/26/2016, 07/03/2017, 07/09/2018, 06/26/2019  . Influenza-Unspecified 08/13/2013, 06/11/2014, 06/26/2016,  07/03/2017, 07/09/2018  . PFIZER(Purple Top)SARS-COV-2 Vaccination 01/08/2020, 02/02/2020  . Pneumococcal Polysaccharide-23 10/18/2012  . Td 10/16/1998, 07/30/2009  . Tdap 10/16/1998, 07/30/2009, 02/12/2020  . Zoster Recombinat (Shingrix) 08/27/2018, 11/27/2018     Health Maintenance  Topic Date Due  . HIV Screening  Never done  . Hepatitis C Screening  Never done  . COVID-19 Vaccine (3 - Booster for Pfizer series) 07/04/2020  . OPHTHALMOLOGY EXAM  11/09/2020  . INFLUENZA VACCINE  05/16/2021  . FOOT EXAM  07/23/2021  . HEMOGLOBIN A1C  09/04/2021  . TETANUS/TDAP  02/11/2030  . COLONOSCOPY (Pts 45-67yrs Insurance coverage will need to be confirmed)  02/25/2031  . PNEUMOCOCCAL POLYSACCHARIDE VACCINE AGE 72-64 HIGH RISK  Completed  . HPV VACCINES  Aged Out       Wt Readings from Last 3 Encounters:  03/08/21 180 lb 6.4 oz (81.8 kg)  02/24/21 180 lb (81.6 kg)  02/10/21 180 lb (81.6 kg)   BP Readings from Last 3 Encounters:  03/08/21 119/70  02/24/21 121/82  11/23/20 128/86   Pulse Readings from Last 3 Encounters:  03/08/21 60  02/24/21 62  11/23/20 60    Patient Active Problem List   Diagnosis Date Noted  . Vitreomacular adhesion of both eyes 11/09/2020  . Age-related nuclear cataract of both eyes 11/09/2020  . Hypertension associated with type 2 diabetes mellitus ([place]) 11/08/2020  . Vitamin D insufficiency 02/12/2020  . Poorly controlled diabetes mellitus ([place])- mgt per Endo; Dr Loanne Drilling 12/26/2019  . Mixed diabetic hyperlipidemia associated with type 2 diabetes mellitus ([place]) 12/26/2019  . Healthcare maintenance 08/27/2018  . Popliteal cyst, left 07/03/2017  . Diabetes ([place]) 06/04/2014  . Hyperlipidemia, mixed 05/27/2007  . ED (erectile dysfunction) of organic origin 05/27/2007  . Essential hypertension 05/17/2007    Past Medical History:  Diagnosis Date  . Allergy   . DIABETES MELLITUS, TYPE II 05/17/2007  . DIVERTICULITIS, ACUTE 08/17/2010  . ERECTILE  DYSFUNCTION, ORGANIC 05/27/2007  . HYPERLIPIDEMIA, MIXED 05/27/2007  . HYPERTENSION 05/17/2007  . TOBACCO ABUSE 05/27/2007    Past Surgical History:  Procedure Laterality Date  . COLONOSCOPY      Family History  Problem Relation Age of Onset  . Diabetes Mother   . Heart attack Father   . Diabetes Father   . Colon polyps Neg Hx   . Colon cancer Neg Hx   . Esophageal cancer Neg Hx     Social History   Substance and Sexual Activity  Drug Use No  ,  Social History   Substance and Sexual Activity  Alcohol Use No  . Alcohol/week: 0.0 standard drinks  ,  Social History   Tobacco Use  Smoking Status Current Every Day Smoker  . Packs/day: 0.55  . Years: 51.00  . Pack years: 28.05  Smokeless Tobacco Never Used  Tobacco Comment   electronic cigarettes, 6/1/2020pt states on and off  ,  Social History   Substance and Sexual Activity  Sexual Activity Not Currently  . Birth control/protection: None    Patient's Medications  New Prescriptions   No medications on file  Previous Medications   ASPIRIN 81 MG TABLET    Take 81 mg by mouth daily.   ATENOLOL-CHLORTHALIDONE (TENORETIC) 50-25 MG TABLET    Take 1  tablet by mouth daily.   ATORVASTATIN (LIPITOR) 80 MG TABLET    TAKE 1 TABLET BY MOUTH DAILY.   GLUCOSE BLOOD (CONTOUR NEXT TEST) TEST STRIP    1 each by Other route 2 (two) times daily. And lancets 2/day   INSULIN NPH HUMAN (HUMULIN N) 100 UNIT/ML INJECTION    Inject 0.75 mLs (75 Units total) into the skin every morning.   INSULIN SYRINGE-NEEDLE U-100 (INSULIN SYRINGE 1CC/30GX5/16") 30G X 5/16" 1 ML MISC    1 Syringe by Does not apply route daily. Use to inject insulin daily   LISINOPRIL (ZESTRIL) 5 MG TABLET    TAKE 1 TABLET BY MOUTH DAILY.   METFORMIN (GLUCOPHAGE) 1000 MG TABLET    TAKE 1 TABLET BY MOUTH TWICE A DAY  Modified Medications   No medications on file  Discontinued Medications   No medications on file    Latex  Review of Systems: General:   Denies fever,  chills, unexplained weight loss.  Optho/Auditory:   Denies visual changes, blurred vision/LOV Respiratory:   Denies SOB, DOE more than baseline levels.   Cardiovascular:   Denies chest pain, palpitations, new onset peripheral edema  Gastrointestinal:   Denies nausea, vomiting, diarrhea.  Genitourinary: Denies dysuria, freq/ urgency, flank pain  Endocrine:     Denies hot or cold intolerance, polyuria, polydipsia. Musculoskeletal:   Denies unexplained myalgias, joint swelling, unexplained arthralgias, gait problems.  Skin:  Denies rash, suspicious lesions Neurological:     Denies dizziness, unexplained weakness, numbness  Psychiatric/Behavioral:   Denies mood changes, suicidal or homicidal ideations, hallucinations    Objective:     Blood pressure 119/70, pulse 60, temperature 99.3 F (37.4 C), height 5\' 7"  (1.702 m), weight 180 lb 6.4 oz (81.8 kg), SpO2 98 %. Body mass index is 28.25 kg/m. General Appearance:    Alert, cooperative, no distress, appears stated age  Head:    Normocephalic, without obvious abnormality, atraumatic  Eyes:    PERRL, conjunctiva/corneas clear, EOM's intact, fundi    benign, both eyes  Ears:    Normal TM's and external ear canals, both ears  Nose:   Nares normal, septum midline, mucosa normal, no drainage    or sinus tenderness  Throat:   Lips w/o lesion, mucosa moist, and tongue normal; teeth and gums fair  Neck:   Supple, symmetrical, trachea midline, no adenopathy;    thyroid:  no enlargement/tenderness/nodules; no carotid   bruit or JVD  Back:     Symmetric, no curvature, ROM normal, no CVA tenderness  Lungs:     Clear to auscultation bilaterally, respirations unlabored, no  Wh/ R/ R  Chest Wall:    No tenderness or gross deformity; normal excursion   Heart:    Regular rate and rhythm, S1 and S2 normal, no murmur, rub   or gallop  Abdomen:     Soft, non-tender, bowel sounds active all four quadrants, No G/R/R, no masses, no organomegaly  Genitalia:    Deferred by patient  Rectal:   Deferred by patient  Extremities:   Extremities normal, atraumatic, no cyanosis or gross edema  Pulses:   2+ and symmetric all extremities  Skin:   Warm, dry, Skin color, texture, turgor normal, no obvious rashes, 2 epidermal cyst (one upper back, second left mid-thoracic area)  M-Sk:   Ambulates * 4 w/o difficulty, no gross deformities, tone WNL  Neurologic:   CNII-XII grossly intact Psych:  No HI/SI, judgement and insight good, Euthymic mood. Full Affect.

## 2021-03-08 NOTE — Patient Instructions (Signed)

## 2021-03-10 ENCOUNTER — Encounter: Payer: Self-pay | Admitting: Gastroenterology

## 2021-03-17 ENCOUNTER — Telehealth: Payer: Self-pay | Admitting: Gastroenterology

## 2021-03-17 NOTE — Telephone Encounter (Signed)
Inbound call from patient requesting colonoscopy results please.

## 2021-03-18 NOTE — Telephone Encounter (Signed)
Left message on machine to call back   Mar 10, 2021   [place] [address]   Dear Tyler Wolf,       The polyp removed during your recent procedure was found to be adenomatous.  These are considered to be pre-cancerous polyps that may have grown into cancers if they had not been removed.  Based on current nationally recognized surveillance guidelines, I recommend that you have a repeat colonoscopy in 7 years.       If you develop any new rectal bleeding, abdominal pain or significant bowel habit changes, please contact me before then.   If you have any questions or concerns, please don't hesitate to call.  Sincerely,    Doran Stabler, MD  [place], [phone]                          1

## 2021-03-21 NOTE — Telephone Encounter (Signed)
Returned call to patient, his vm is full, I am unable to leave a message at this time.

## 2021-03-22 NOTE — Telephone Encounter (Signed)
Spoke with patient, he states that he received the pathology letter in the mail. Pt denies any further concerns at this time.

## 2021-04-01 ENCOUNTER — Other Ambulatory Visit: Payer: Self-pay

## 2021-04-01 ENCOUNTER — Ambulatory Visit
Admission: EM | Admit: 2021-04-01 | Discharge: 2021-04-01 | Disposition: A | Discharge status: Home / Self Care | Payer: BC Managed Care – PPO | Attending: Student | Admitting: Student

## 2021-04-01 DIAGNOSIS — M5441 Lumbago with sciatica, right side: Secondary | ICD-10-CM | POA: Diagnosis not present

## 2021-04-01 MED ORDER — TIZANIDINE HCL 2 MG PO CAPS: 2.0000 mg | ORAL_CAPSULE | Freq: Three times a day (TID) | ORAL | 0 refills | Status: DC

## 2021-04-01 MED ORDER — PREDNISONE 20 MG PO TABS: 20.0000 mg | ORAL_TABLET | Freq: Every day | ORAL | 0 refills | Status: AC

## 2021-04-01 NOTE — ED Triage Notes (Signed)
Pt present leg and back pain. Symptom started four days ago.

## 2021-04-01 NOTE — ED Provider Notes (Signed)
[facility]    CSN: [id number] Arrival date & time: 04/01/21  0907      History   Chief Complaint Chief Complaint  Patient presents with   Back Pain   Leg Pain    HPI Tyler Wolf is a 65 y.o. male presenting with 4 days back and leg pain following heavy lifting.  States he has chronic back pain with right-sided sciatica that is exacerbated due to similar issues.  Denies new trauma. denies numbness in arms/legs, denies weakness in arms/legs, denies saddle anesthesia, denies bowel/bladder incontinence, denies urinary retention, denies constipation.   HPI  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   HYPERLIPIDEMIA, MIXED 05/27/2007   HYPERTENSION 05/17/2007   TOBACCO ABUSE 05/27/2007    Patient Active Problem List   Diagnosis Date Noted   Vitreomacular adhesion of both eyes 11/09/2020   Age-related nuclear cataract of both eyes 11/09/2020   Hypertension associated with type 2 diabetes mellitus ([place]) 11/08/2020   Vitamin D insufficiency 02/12/2020   Poorly controlled diabetes mellitus ([place])- mgt per Endo; Dr Loanne Drilling 12/26/2019   Mixed diabetic hyperlipidemia associated with type 2 diabetes mellitus ([place]) 12/26/2019   Healthcare maintenance 08/27/2018   Popliteal cyst, left 07/03/2017   Diabetes ([place]) 06/04/2014   Hyperlipidemia, mixed 05/27/2007   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 Authorizing Provider  predniSONE (DELTASONE) 20 MG tablet Take 1 tablet (20 mg total) by mouth daily for 5 days. 04/01/21 04/06/21 Yes Hazel Sams, PA-C  tizanidine (ZANAFLEX) 2 MG capsule Take 1 capsule (2 mg total) by mouth  3 (three) times daily. 04/01/21  Yes Hazel Sams, PA-C  aspirin 81 MG tablet Take 81 mg by mouth daily.    [provider]  atenolol-chlorthalidone (TENORETIC) 50-25 MG tablet Take 1 tablet by mouth daily. 11/08/20   Lorrene Reid, PA-C  atorvastatin (LIPITOR) 80 MG tablet TAKE 1 TABLET BY MOUTH DAILY. 11/08/20   Abonza, Maritza, PA-C  glucose blood (CONTOUR NEXT TEST) test strip 1 each by Other route 2 (two) times daily. And lancets 2/day 02/04/19   Renato Shin, MD  insulin NPH Human (HUMULIN N) 100 UNIT/ML injection Inject 0.75 mLs (75 Units total) into the skin every morning. 05/05/20   Renato Shin, MD  Insulin Syringe-Needle U-100 (INSULIN SYRINGE 1CC/30GX5/16") 30G X 5/16" 1 ML MISC 1 Syringe by Does not apply route daily. Use to inject insulin daily 04/03/19   Renato Shin, MD  lisinopril (ZESTRIL) 5 MG tablet TAKE 1 TABLET BY MOUTH DAILY. 02/12/20   Mellody Dance, DO  metFORMIN (GLUCOPHAGE) 1000 MG tablet TAKE 1 TABLET BY MOUTH TWICE A DAY 10/29/20   Renato Shin, MD    Family History Family History  Problem Relation Age of Onset   Diabetes Mother    Heart attack Father    Diabetes Father    Colon polyps Neg Hx    Colon cancer Neg Hx    Esophageal cancer Neg Hx     Social History Social History   Tobacco Use   Smoking status: Every Day    Packs/day: 0.55    Years: 51.00  Pack years: 28.05    Types: Cigarettes   Smokeless tobacco: Never   Tobacco comments:    electronic cigarettes, 6/1/2020pt states on and off  Vaping Use   Vaping Use: Never used  Substance Use Topics   Alcohol use: No    Alcohol/week: 0.0 standard drinks   Drug use: No     Allergies   Latex   Review of Systems Review of Systems  Musculoskeletal:  Positive for back pain.  All other systems reviewed and are negative.   Physical Exam Triage Vital Signs ED Triage Vitals  Enc Vitals Group     BP 04/01/21 1141 (!) 156/88     Pulse Rate 04/01/21 1141 61     Resp 04/01/21  1141 16     Temp 04/01/21 1141 (!) 97.5 F (36.4 C)     Temp Source 04/01/21 1141 Oral     SpO2 04/01/21 1141 100 %     Weight --      Height --      Head Circumference --      Peak Flow --      Pain Score 04/01/21 1142 3     Pain Loc --      Pain Edu? --      Excl. in [place]? --    No data found.  Updated Vital Signs BP (!) 156/88 (BP Location: Left Arm)   Pulse 61   Temp (!) 97.5 F (36.4 C) (Oral)   Resp 16   SpO2 100%   Visual Acuity Right Eye Distance:   Left Eye Distance:   Bilateral Distance:    Right Eye Near:   Left Eye Near:    Bilateral Near:     Physical Exam Vitals reviewed.  Constitutional:      General: He is not in acute distress.    Appearance: Normal appearance. He is not ill-appearing.  HENT:     Head: Normocephalic and atraumatic.  Cardiovascular:     Rate and Rhythm: Normal rate and regular rhythm.     Heart sounds: Normal heart sounds.  Pulmonary:     Effort: Pulmonary effort is normal.     Breath sounds: Normal breath sounds and air entry.  Abdominal:     Tenderness: There is no abdominal tenderness. There is no right CVA tenderness, left CVA tenderness, guarding or rebound.  Musculoskeletal:     Cervical back: Normal range of motion. No swelling, deformity, signs of trauma, rigidity, spasms, tenderness, bony tenderness or crepitus. No pain with movement.     Thoracic back: No swelling, deformity, signs of trauma, spasms, tenderness or bony tenderness. Normal range of motion. No scoliosis.     Lumbar back: Spasms and tenderness present. No swelling, deformity, signs of trauma or bony tenderness. Normal range of motion. Positive right straight leg raise test. Negative left straight leg raise test. No scoliosis.     Comments: R lumbar paraspinous muscle tenderness to deep palpation. Positive R straight leg raise. No spinous deformity, stepoff.   Absolutely no other injury, deformity, tenderness, ecchymosis, abrasion.  Neurological:     General:  No focal deficit present.     Mental Status: He is alert.     Cranial Nerves: No cranial nerve deficit.  Psychiatric:        Mood and Affect: Mood normal.        Behavior: Behavior normal.        Thought Content: Thought content normal.        Judgment:  Judgment normal.     UC Treatments / Results  Labs (all labs ordered are listed, but only abnormal results are displayed) Labs Reviewed - No data to display  EKG   Radiology No results found.  Procedures Procedures (including critical care time)  Medications Ordered in UC Medications - No data to display  Initial Impression / Assessment and Plan / UC Course  I have reviewed the triage vital signs and the nursing notes.  Pertinent labs & imaging results that were available during my care of the patient were reviewed by me and considered in my medical decision making (see chart for details).     This patient is a 65 year old male presenting with acute exacerbation of his chronic back pain with sciatica. No red flag symptoms.  A1c 8.2 four weeks ago. Will treat with low dose prednisone considering this- 20mg  daily x5 days.  Zanaflex, tylenol for additional relief.  Red flag symptoms and ED return precautions discussed. Patient verbalizes understanding and agreement.   Final Clinical Impressions(s) / UC Diagnoses   Final diagnoses:  Acute right-sided low back pain with right-sided sciatica     Discharge Instructions      -Prednisone 1 pill daily for 5 days.  Try taking this earlier in the day as it can give you energy.  Monitor your sugars at home while you take this, if these are higher than about 160, stop the prednisone and follow-up with your primary care. Avoid ibuprofen while you take this medication. -Start the muscle relaxer-Zanaflex (tizanidine), up to 3 times daily for muscle spasms and pain.  This can make you drowsy, so take at bedtime or when you do not need to drive or operate machinery. -Heat/ice,  tylenol for additional relief -Seek additional medical attention if  symptoms worsen/persist      ED Prescriptions     Medication Sig Dispense Auth. Provider   predniSONE (DELTASONE) 20 MG tablet Take 1 tablet (20 mg total) by mouth daily for 5 days. 5 tablet Hazel Sams, PA-C   tizanidine (ZANAFLEX) 2 MG capsule Take 1 capsule (2 mg total) by mouth 3 (three) times daily. 21 capsule Hazel Sams, PA-C      PDMP not reviewed this encounter.   Hazel Sams, PA-C 04/01/21 1243

## 2021-04-01 NOTE — Discharge Instructions (Addendum)
-  Prednisone 1 pill daily for 5 days.  Try taking this earlier in the day as it can give you energy.  Monitor your sugars at home while you take this, if these are higher than about 160, stop the prednisone and follow-up with your primary care. Avoid ibuprofen while you take this medication. -Start the muscle relaxer-Zanaflex (tizanidine), up to 3 times daily for muscle spasms and pain.  This can make you drowsy, so take at bedtime or when you do not need to drive or operate machinery. -Heat/ice, tylenol for additional relief -Seek additional medical attention if  symptoms worsen/persist

## 2021-04-05 ENCOUNTER — Ambulatory Visit: Payer: BC Managed Care – PPO | Admitting: Endocrinology

## 2021-04-08 ENCOUNTER — Other Ambulatory Visit: Payer: Self-pay | Admitting: Physician Assistant

## 2021-04-08 DIAGNOSIS — M25551 Pain in right hip: Secondary | ICD-10-CM | POA: Diagnosis not present

## 2021-04-08 DIAGNOSIS — I152 Hypertension secondary to endocrine disorders: Secondary | ICD-10-CM

## 2021-04-08 DIAGNOSIS — E1159 Type 2 diabetes mellitus with other circulatory complications: Secondary | ICD-10-CM

## 2021-04-08 DIAGNOSIS — M545 Low back pain, unspecified: Secondary | ICD-10-CM | POA: Diagnosis not present

## 2021-04-12 DIAGNOSIS — M545 Low back pain, unspecified: Secondary | ICD-10-CM | POA: Diagnosis not present

## 2021-04-13 ENCOUNTER — Ambulatory Visit: Payer: BC Managed Care – PPO | Admitting: Endocrinology

## 2021-04-15 DIAGNOSIS — M545 Low back pain, unspecified: Secondary | ICD-10-CM | POA: Diagnosis not present

## 2021-04-25 DIAGNOSIS — M5416 Radiculopathy, lumbar region: Secondary | ICD-10-CM | POA: Diagnosis not present

## 2021-05-11 DIAGNOSIS — M5416 Radiculopathy, lumbar region: Secondary | ICD-10-CM | POA: Diagnosis not present

## 2021-05-17 DIAGNOSIS — M5416 Radiculopathy, lumbar region: Secondary | ICD-10-CM | POA: Diagnosis not present

## 2021-05-20 DIAGNOSIS — M5459 Other low back pain: Secondary | ICD-10-CM | POA: Diagnosis not present

## 2021-05-20 DIAGNOSIS — M6281 Muscle weakness (generalized): Secondary | ICD-10-CM | POA: Diagnosis not present

## 2021-05-20 DIAGNOSIS — M5416 Radiculopathy, lumbar region: Secondary | ICD-10-CM | POA: Diagnosis not present

## 2021-05-27 DIAGNOSIS — M6281 Muscle weakness (generalized): Secondary | ICD-10-CM | POA: Diagnosis not present

## 2021-05-27 DIAGNOSIS — M5416 Radiculopathy, lumbar region: Secondary | ICD-10-CM | POA: Diagnosis not present

## 2021-05-27 DIAGNOSIS — M5459 Other low back pain: Secondary | ICD-10-CM | POA: Diagnosis not present

## 2021-05-30 DIAGNOSIS — M6281 Muscle weakness (generalized): Secondary | ICD-10-CM | POA: Diagnosis not present

## 2021-05-30 DIAGNOSIS — M5416 Radiculopathy, lumbar region: Secondary | ICD-10-CM | POA: Diagnosis not present

## 2021-05-30 DIAGNOSIS — M5459 Other low back pain: Secondary | ICD-10-CM | POA: Diagnosis not present

## 2021-06-01 DIAGNOSIS — M5416 Radiculopathy, lumbar region: Secondary | ICD-10-CM | POA: Diagnosis not present

## 2021-06-02 DIAGNOSIS — M5416 Radiculopathy, lumbar region: Secondary | ICD-10-CM | POA: Diagnosis not present

## 2021-06-02 DIAGNOSIS — M5459 Other low back pain: Secondary | ICD-10-CM | POA: Diagnosis not present

## 2021-06-02 DIAGNOSIS — M6281 Muscle weakness (generalized): Secondary | ICD-10-CM | POA: Diagnosis not present

## 2021-06-06 ENCOUNTER — Telehealth: Payer: Self-pay | Admitting: Physician Assistant

## 2021-06-06 NOTE — Telephone Encounter (Signed)
Ben,   Please call the patient back and schedule him soonest available to be evaluated.   Very Respectfully,  Gwyndolyn Saxon

## 2021-06-06 NOTE — Telephone Encounter (Signed)
Patient's wife called stating the cyst on the back of patient's neck is bothering him and causing discomfort. Please advise, thanks.

## 2021-06-07 ENCOUNTER — Encounter: Payer: Self-pay | Admitting: Nurse Practitioner

## 2021-06-07 ENCOUNTER — Other Ambulatory Visit: Payer: Self-pay

## 2021-06-07 ENCOUNTER — Ambulatory Visit: Payer: BC Managed Care – PPO | Admitting: Nurse Practitioner

## 2021-06-07 VITALS — BP 134/69 | HR 83 | Temp 98.3°F | Ht 67.0 in | Wt 182.0 lb

## 2021-06-07 DIAGNOSIS — L0211 Cutaneous abscess of neck: Secondary | ICD-10-CM | POA: Diagnosis not present

## 2021-06-07 DIAGNOSIS — L729 Follicular cyst of the skin and subcutaneous tissue, unspecified: Secondary | ICD-10-CM

## 2021-06-07 MED ORDER — SULFAMETHOXAZOLE-TRIMETHOPRIM 800-160 MG PO TABS: 1.0000 | ORAL_TABLET | Freq: Two times a day (BID) | ORAL | 0 refills | Status: DC

## 2021-06-07 NOTE — Progress Notes (Signed)
Established Patient Office Visit  Subjective:  Patient ID: Tyler Wolf, male    DOB: May 23, 1956  Age: 65 y.o. MRN: [id number]  CC:  Chief Complaint  Patient presents with   Cyst    HPI [name] presents for evaluation of a skin cyst.  He states that he has had the cyst present for 5 to 6 years.  His previous provider told him it was really nothing to be concerned about unless it got bigger or got painful.  Patient states over the last week has gradually become larger and has definitely become painful.  States that even his shirt touching the lesion is painful.  States that she is getting worse.  Denies fever, chills, body aches or pain.  Denies headache.  Has no other concerns or complaints today.  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   HYPERLIPIDEMIA, MIXED 05/27/2007   HYPERTENSION 05/17/2007   TOBACCO ABUSE 05/27/2007    Past Surgical History:  Procedure Laterality Date   COLONOSCOPY      Family History  Problem Relation Age of Onset   Diabetes Mother    Heart attack Father    Diabetes Father    Colon polyps Neg Hx    Colon cancer Neg Hx    Esophageal cancer Neg Hx     Social History   Socioeconomic History   Marital status: Married    Spouse name: Not on file   Number of children: Not on file   Years of education: Not on file   Highest education level: Not on file  Occupational History   Not on file  Tobacco Use   Smoking status: Every Day    Packs/day: 0.55    Years: 51.00    Pack years: 28.05    Types: Cigarettes   Smokeless tobacco: Never   Tobacco comments:    electronic cigarettes, 6/1/2020pt states on and off  Vaping Use   Vaping Use: Never used  Substance and Sexual Activity   Alcohol use: No    Alcohol/week: 0.0 standard drinks   Drug use: No   Sexual activity: Not Currently    Birth control/protection: None   Other Topics Concern   Not on file  Social History Narrative   Not on file   Social Determinants of Health   Financial Resource Strain: Not on file  Food Insecurity: Not on file  Transportation Needs: Not on file  Physical Activity: Not on file  Stress: Not on file  Social Connections: Not on file  Intimate Partner Violence: Not on file    Outpatient Medications Prior to Visit  Medication Sig Dispense Refill   aspirin 81 MG tablet Take 81 mg by mouth daily.     atenolol-chlorthalidone (TENORETIC) 50-25 MG tablet Take 1 tablet by mouth daily. 90 tablet 1   atorvastatin (LIPITOR) 80 MG tablet TAKE 1 TABLET BY MOUTH DAILY. 90 tablet 1   glucose blood (CONTOUR NEXT TEST) test strip 1 each by Other route 2 (two) times daily. And lancets 2/day 200 each 3   insulin NPH Human (HUMULIN N) 100 UNIT/ML injection Inject 0.75 mLs (75 Units total) into the skin every morning. 20 mL 11   Insulin Syringe-Needle U-100 (INSULIN SYRINGE 1CC/30GX5/16") 30G X 5/16" 1 ML MISC 1 Syringe by Does not apply route daily. Use to inject insulin daily 100 each 0  lisinopril (ZESTRIL) 5 MG tablet TAKE 1 TABLET BY MOUTH DAILY. 90 tablet 0   metFORMIN (GLUCOPHAGE) 1000 MG tablet TAKE 1 TABLET BY MOUTH TWICE A DAY 180 tablet 4   tizanidine (ZANAFLEX) 2 MG capsule Take 1 capsule (2 mg total) by mouth 3 (three) times daily. 21 capsule 0   No facility-administered medications prior to visit.    Allergies  Allergen Reactions   Latex     REACTION: Rash    ROS Review of Systems  Constitutional:  Negative for activity change, chills, fatigue and fever.  HENT:  Negative for congestion, postnasal drip, rhinorrhea, sinus pressure and sinus pain.   Eyes: Negative.   Respiratory:  Negative for cough, chest tightness and shortness of breath.   Cardiovascular:  Negative for chest pain and palpitations.  Gastrointestinal:  Negative for constipation, diarrhea, nausea and vomiting.  Genitourinary: Negative.    Musculoskeletal:  Positive for back pain. Negative for myalgias.  Skin:  Negative for rash.       Skin cyst, present on the base of the neck on the right side.  Has been getting larger and has become painful over the last few weeks.  Cyst itself has been present for 5 to 6 years.  States it never gave him problems before.  Neurological:  Negative for dizziness, weakness and headaches.  Hematological: Negative.   Psychiatric/Behavioral:  The patient is not nervous/anxious.      Objective:    Physical Exam Vitals and nursing note reviewed.  Constitutional:      Appearance: Normal appearance. He is well-developed.  HENT:     Head: Normocephalic and atraumatic.     Nose: Nose normal.     Mouth/Throat:     Mouth: Mucous membranes are moist.  Eyes:     Extraocular Movements: Extraocular movements intact.     Conjunctiva/sclera: Conjunctivae normal.     Pupils: Pupils are equal, round, and reactive to light.  Neck:   Cardiovascular:     Rate and Rhythm: Normal rate and regular rhythm.     Pulses: Normal pulses.     Heart sounds: Normal heart sounds.  Pulmonary:     Effort: Pulmonary effort is normal.     Breath sounds: Normal breath sounds.  Abdominal:     Palpations: Abdomen is soft.  Musculoskeletal:        General: Normal range of motion.     Cervical back: Normal range of motion and neck supple.  Lymphadenopathy:     Cervical: No cervical adenopathy.  Skin:    General: Skin is warm and dry.     Capillary Refill: Capillary refill takes less than 2 seconds.     Comments: A simple I&D was performed on cutaneous abscess.  After the patient was numbed with lidocaine locally, there was an approximately 2 to 3 cm linear incision made horizontally across the cyst.  A moderate amount of serosanguineous fluid which was also malodorous was drained from the cyst.  The area was cleaned and dried following the procedure.  Incision was left open to allow for draining overnight.  A clean dry  dressing was applied.  Patient tolerated procedure well.  Neurological:     General: No focal deficit present.     Mental Status: He is alert and oriented to person, place, and time.  Psychiatric:        Mood and Affect: Mood normal.        Behavior: Behavior normal.  Thought Content: Thought content normal.        Judgment: Judgment normal.    Today's Vitals   06/07/21 1312  BP: 134/69  Pulse: 83  Temp: 98.3 F (36.8 C)  SpO2: 98%  Weight: 182 lb (82.6 kg)  Height: $Remove'5\' 7"'wjeVcUK$  (1.702 m)   Body mass index is 28.51 kg/m.   Wt Readings from Last 3 Encounters:  06/08/21 182 lb 4.8 oz (82.7 kg)  06/07/21 182 lb (82.6 kg)  03/08/21 180 lb 6.4 oz (81.8 kg)     Health Maintenance Due  Topic Date Due   HIV Screening  Never done   Hepatitis C Screening  Never done   Pneumococcal Vaccine 12-13 Years old (2 - PCV) 10/18/2013   COVID-19 Vaccine (3 - Booster for Pfizer series) 07/04/2020   OPHTHALMOLOGY EXAM  11/09/2020   INFLUENZA VACCINE  05/16/2021    There are no preventive care reminders to display for this patient.  Lab Results  Component Value Date   TSH 2.110 03/04/2021   Lab Results  Component Value Date   WBC 7.9 03/04/2021   HGB 16.5 03/04/2021   HCT 50.5 03/04/2021   MCV 95 03/04/2021   PLT 244 03/04/2021   Lab Results  Component Value Date   NA 138 03/04/2021   K 4.3 03/04/2021   CO2 25 03/04/2021   GLUCOSE 180 (H) 03/04/2021   BUN 20 03/04/2021   CREATININE 0.85 03/04/2021   BILITOT 0.5 03/04/2021   ALKPHOS 80 03/04/2021   AST 13 03/04/2021   ALT 16 03/04/2021   PROT 7.3 03/04/2021   ALBUMIN 4.6 03/04/2021   CALCIUM 9.6 03/04/2021   EGFR 98 03/04/2021   GFR 103.03 07/01/2018   Lab Results  Component Value Date   CHOL 137 03/04/2021   Lab Results  Component Value Date   HDL 41 03/04/2021   Lab Results  Component Value Date   LDLCALC 75 03/04/2021   Lab Results  Component Value Date   TRIG 115 03/04/2021   Lab Results  Component  Value Date   CHOLHDL 3.3 03/04/2021   Lab Results  Component Value Date   HGBA1C 8.2 (H) 03/04/2021      Assessment & Plan:  1. Cutaneous abscess of neck Simple I&D was performed on cutaneous abscess of the neck.  Moderate amount of serosanguineous, malodorous fluid was drained from abscess.  Patient tolerated procedure well.  Started on Bactrim DS twice daily for next 10 days.  We will see patient back tomorrow for surveillance. - sulfamethoxazole-trimethoprim (BACTRIM DS) 800-160 MG tablet; Take 1 tablet by mouth 2 (two) times daily.  Dispense: 20 tablet; Refill: 0   Problem List Items Addressed This Visit       Musculoskeletal and Integument   Cutaneous abscess of neck - Primary   Relevant Medications   sulfamethoxazole-trimethoprim (BACTRIM DS) 800-160 MG tablet    Meds ordered this encounter  Medications   sulfamethoxazole-trimethoprim (BACTRIM DS) 800-160 MG tablet    Sig: Take 1 tablet by mouth 2 (two) times daily.    Dispense:  20 tablet    Refill:  0   This note was dictated using Systems analyst. Rapid proofreading was performed to expedite the delivery of the information. Despite proofreading, phonetic errors will occur which are common with this voice recognition software. Please take this into consideration. If there are any concerns, please contact our office.    Follow-up: Return in about 1 day (around 06/08/2021), or abscess.  Ronnell Freshwater, NP

## 2021-06-07 NOTE — Patient Instructions (Signed)
Incision and Drainage, Care After This sheet gives you information about how to care for yourself after your procedure. Your health care provider may also give you more specific instructions. If you have problems or questions, contact your health careprovider. What can I expect after the procedure? After the procedure, it is common to have: Pain or discomfort around the incision site. Blood, fluid, or pus (drainage) from the incision. Redness and firm skin around the incision site. Follow these instructions at home: Medicines Take over-the-counter and prescription medicines only as told by your health care provider. If you were prescribed an antibiotic medicine, use or take it as told by your health care provider. Do not stop using the antibiotic even if you start to feel better. Wound care Follow instructions from your health care provider about how to take care of your wound. Make sure you: Wash your hands with soap and water before and after you change your bandage (dressing). If soap and water are not available, use hand sanitizer. Change your dressing and packing as told by your health care provider. If your dressing is dry or stuck when you try to remove it, moisten or wet the dressing with saline or water so that it can be removed without harming your skin or tissues. If your wound is packed, leave it in place until your health care provider tells you to remove it. To remove the packing, moisten or wet the packing with saline or water so that it can be removed without harming your skin or tissues. Leave stitches (sutures), skin glue, or adhesive strips in place. These skin closures may need to stay in place for 2 weeks or longer. If adhesive strip edges start to loosen and curl up, you may trim the loose edges. Do not remove adhesive strips completely unless your health care provider tells you to do that. Check your wound every day for signs of infection. Check for: More redness, swelling,  or pain. More fluid or blood. Warmth. Pus or a bad smell. If you were sent home with a drain tube in place, follow instructions from your health care provider about: How to empty it. How to care for it at home.  General instructions Rest the affected area. Do not take baths, swim, or use a hot tub until your health care provider approves. Ask your health care provider if you may take showers. You may only be allowed to take sponge baths. Return to your normal activities as told by your health care provider. Ask your health care provider what activities are safe for you. Your health care provider may put you on activity or lifting restrictions. The incision will continue to drain. It is normal to have some clear or slightly bloody drainage. The amount of drainage should lessen each day. Do not apply any creams, ointments, or liquids unless you have been told to by your health care provider. Keep all follow-up visits as told by your health care provider. This is important. Contact a health care provider if: Your cyst or abscess returns. You have a fever or chills. You have more redness, swelling, or pain around your incision. You have more fluid or blood coming from your incision. Your incision feels warm to the touch. You have pus or a bad smell coming from your incision. You have red streaks above or below the incision site. Get help right away if: You have severe pain or bleeding. You cannot eat or drink without vomiting. You have decreased urine output. You  become short of breath. You have chest pain. You cough up blood. The affected area becomes numb or starts to tingle. These symptoms may represent a serious problem that is an emergency. Do not wait to see if the symptoms will go away. Get medical help right away. Call your local emergency services (911 in the [place] Do not drive yourself to the hospital. Summary After this procedure, it is common to have fluid, blood, or pus  coming from the surgery site. Follow all home care instructions. You will be told how to take care of your incision, how to check for infection, and how to take medicines. If you were prescribed an antibiotic medicine, take it as told by your health care provider. Do not stop taking the antibiotic even if you start to feel better. Contact a health care provider if you have increased redness, swelling, or pain around your incision. Get help right away if you have chest pain, you vomit, you cough up blood, or you have shortness of breath. Keep all follow-up visits as told by your health care provider. This is important. This information is not intended to replace advice given to you by your health care provider. Make sure you discuss any questions you have with your healthcare provider. Document Revised: 09/02/2018 Document Reviewed: 09/02/2018 Elsevier Patient Education  2022 [employer].

## 2021-06-08 ENCOUNTER — Encounter: Payer: Self-pay | Admitting: Nurse Practitioner

## 2021-06-08 ENCOUNTER — Ambulatory Visit: Payer: BC Managed Care – PPO | Admitting: Dermatology

## 2021-06-08 ENCOUNTER — Ambulatory Visit: Payer: Self-pay | Admitting: Nurse Practitioner

## 2021-06-08 VITALS — BP 114/72 | HR 72 | Temp 97.9°F | Ht 67.0 in | Wt 182.3 lb

## 2021-06-08 DIAGNOSIS — L720 Epidermal cyst: Secondary | ICD-10-CM | POA: Diagnosis not present

## 2021-06-08 DIAGNOSIS — L0211 Cutaneous abscess of neck: Secondary | ICD-10-CM

## 2021-06-08 MED ORDER — DOXYCYCLINE HYCLATE 100 MG PO CAPS: 100.0000 mg | ORAL_CAPSULE | Freq: Two times a day (BID) | ORAL | 0 refills | Status: AC

## 2021-06-08 MED ORDER — BACITRACIN 500 UNIT/GM EX OINT: 1.0000 "application " | TOPICAL_OINTMENT | Freq: Two times a day (BID) | CUTANEOUS | 0 refills | Status: DC

## 2021-06-08 NOTE — Patient Instructions (Signed)

## 2021-06-08 NOTE — Progress Notes (Signed)
Established Patient Office Visit  Subjective:  Patient ID: Tyler Wolf, male    DOB: [birth year]  Age: 65 y.o. MRN: [id number]  CC:  Chief Complaint  Patient presents with   Abscess    HPI Tyler Wolf presents for evaluation of cutaneous abscess of the neck.  Seen yesterday golf ball sized cutaneous abscess at the base of his neck just to the right of the spine.  Simple I&D procedure was performed.  Though a moderate amount of amount of serosanguineous fluid with amount of serosanguineous fluid was expelled from the lesion, there was still fluid which remained in the abscess.  Patient tolerated the procedure well.  Started taking Bactrim DS last night.  He states that abscess is relatively tender to the touch.  It is smaller than it was yesterday.  He denies fever, chills, body aches or pain.  He denies other concerning symptoms today.  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   HYPERLIPIDEMIA, MIXED 05/27/2007   HYPERTENSION 05/17/2007   TOBACCO ABUSE 05/27/2007    Past Surgical History:  Procedure Laterality Date   COLONOSCOPY      Family History  Problem Relation Age of Onset   Diabetes Mother    Heart attack Father    Diabetes Father    Colon polyps Neg Hx    Colon cancer Neg Hx    Esophageal cancer Neg Hx     Social History   Socioeconomic History   Marital status: Married    Spouse name: Not on file   Number of children: Not on file   Years of education: Not on file   Highest education level: Not on file  Occupational History   Not on file  Tobacco Use   Smoking status: Every Day    Packs/day: 0.55    Years: 51.00    Pack years: 28.05    Types: Cigarettes   Smokeless tobacco: Never   Tobacco comments:    electronic cigarettes, 6/1/2020pt states on and off  Vaping Use   Vaping Use: Never used  Substance and Sexual Activity    Alcohol use: No    Alcohol/week: 0.0 standard drinks   Drug use: No   Sexual activity: Not Currently    Birth control/protection: None  Other Topics Concern   Not on file  Social History Narrative   Not on file   Social Determinants of Health   Financial Resource Strain: Not on file  Food Insecurity: Not on file  Transportation Needs: Not on file  Physical Activity: Not on file  Stress: Not on file  Social Connections: Not on file  Intimate Partner Violence: Not on file    Outpatient Medications Prior to Visit  Medication Sig Dispense Refill   aspirin 81 MG tablet Take 81 mg by mouth daily.     atenolol-chlorthalidone (TENORETIC) 50-25 MG tablet Take 1 tablet by mouth daily. 90 tablet 1   atorvastatin (LIPITOR) 80 MG tablet TAKE 1 TABLET BY MOUTH DAILY. 90 tablet 1   glucose blood (CONTOUR NEXT TEST) test strip 1 each by Other route 2 (two) times daily. And lancets 2/day 200 each 3   insulin NPH Human (HUMULIN N) 100 UNIT/ML injection Inject 0.75 mLs (75 Units total) into the skin every morning. 20 mL 11   Insulin Syringe-Needle U-100 (INSULIN SYRINGE 1CC/30GX5/16") 30G X 5/16" 1 ML MISC 1  Syringe by Does not apply route daily. Use to inject insulin daily 100 each 0   lisinopril (ZESTRIL) 5 MG tablet TAKE 1 TABLET BY MOUTH DAILY. 90 tablet 0   metFORMIN (GLUCOPHAGE) 1000 MG tablet TAKE 1 TABLET BY MOUTH TWICE A DAY 180 tablet 4   sulfamethoxazole-trimethoprim (BACTRIM DS) 800-160 MG tablet Take 1 tablet by mouth 2 (two) times daily. 20 tablet 0   tizanidine (ZANAFLEX) 2 MG capsule Take 1 capsule (2 mg total) by mouth 3 (three) times daily. 21 capsule 0   No facility-administered medications prior to visit.    Allergies  Allergen Reactions   Latex     REACTION: Rash    ROS Review of Systems  Constitutional:  Negative for activity change, chills, fatigue and fever.  HENT:  Negative for congestion, postnasal drip, rhinorrhea, sinus pressure and sinus pain.   Eyes:  Negative.   Respiratory:  Negative for cough, chest tightness and shortness of breath.   Cardiovascular:  Negative for chest pain and palpitations.  Gastrointestinal:  Negative for constipation, diarrhea, nausea and vomiting.  Genitourinary: Negative.   Musculoskeletal:  Positive for back pain. Negative for myalgias.  Skin:  Negative for rash.       Simple I&D of neck abscess done yesterday.  Patient states that area is tender and sore.  It is red.  It is smaller than yesterday.  Neurological:  Negative for dizziness, weakness and headaches.  Hematological: Negative.   Psychiatric/Behavioral:  The patient is not nervous/anxious.      Objective:    Physical Exam Vitals and nursing note reviewed.  Constitutional:      Appearance: Normal appearance. He is well-developed.  HENT:     Head: Normocephalic and atraumatic.     Nose: Nose normal.     Mouth/Throat:     Mouth: Mucous membranes are moist.  Eyes:     Extraocular Movements: Extraocular movements intact.     Conjunctiva/sclera: Conjunctivae normal.     Pupils: Pupils are equal, round, and reactive to light.  Cardiovascular:     Rate and Rhythm: Normal rate and regular rhythm.     Pulses: Normal pulses.     Heart sounds: Normal heart sounds.  Pulmonary:     Effort: Pulmonary effort is normal.     Breath sounds: Normal breath sounds.  Abdominal:     Palpations: Abdomen is soft.  Musculoskeletal:        General: Normal range of motion.     Cervical back: Normal range of motion and neck supple.  Lymphadenopathy:     Cervical: No cervical adenopathy.  Skin:    General: Skin is warm and dry.     Capillary Refill: Capillary refill takes less than 2 seconds.     Findings: Erythema and lesion present.     Comments: Abscess of the neck red and tender.  Incision left open to allow for excess drainage.  Continues to drain small amount of serosanguineous fluid.  Abscess is warm to touch.  Cleaned incision with sterile water.  Apply  bacitracin ointment inside incision and around the edges of the incision.  Cover ed with clean dry gauze and clean adhesive bandage.  Neurological:     General: No focal deficit present.     Mental Status: He is alert and oriented to person, place, and time.  Psychiatric:        Mood and Affect: Mood normal.        Behavior: Behavior normal.  Thought Content: Thought content normal.        Judgment: Judgment normal.   Today's Vitals   06/08/21 0934  BP: 114/72  Pulse: 72  Temp: 97.9 F (36.6 C)  SpO2: 98%  Weight: 182 lb 4.8 oz (82.7 kg)  Height: '5\' 7"'  (1.702 m)   Body mass index is 28.55 kg/m.   Wt Readings from Last 3 Encounters:  06/08/21 182 lb 4.8 oz (82.7 kg)  06/07/21 182 lb (82.6 kg)  03/08/21 180 lb 6.4 oz (81.8 kg)     Health Maintenance Due  Topic Date Due   HIV Screening  Never done   Hepatitis C Screening  Never done   Pneumococcal Vaccine 49-97 Years old (2 - PCV) 10/18/2013   COVID-19 Vaccine (3 - Booster for Pfizer series) 07/04/2020   OPHTHALMOLOGY EXAM  11/09/2020   INFLUENZA VACCINE  05/16/2021    There are no preventive care reminders to display for this patient.  Lab Results  Component Value Date   TSH 2.110 03/04/2021   Lab Results  Component Value Date   WBC 7.9 03/04/2021   HGB 16.5 03/04/2021   HCT 50.5 03/04/2021   MCV 95 03/04/2021   PLT 244 03/04/2021   Lab Results  Component Value Date   NA 138 03/04/2021   K 4.3 03/04/2021   CO2 25 03/04/2021   GLUCOSE 180 (H) 03/04/2021   BUN 20 03/04/2021   CREATININE 0.85 03/04/2021   BILITOT 0.5 03/04/2021   ALKPHOS 80 03/04/2021   AST 13 03/04/2021   ALT 16 03/04/2021   PROT 7.3 03/04/2021   ALBUMIN 4.6 03/04/2021   CALCIUM 9.6 03/04/2021   EGFR 98 03/04/2021   GFR 103.03 07/01/2018   Lab Results  Component Value Date   CHOL 137 03/04/2021   Lab Results  Component Value Date   HDL 41 03/04/2021   Lab Results  Component Value Date   LDLCALC 75 03/04/2021   Lab  Results  Component Value Date   TRIG 115 03/04/2021   Lab Results  Component Value Date   CHOLHDL 3.3 03/04/2021   Lab Results  Component Value Date   HGBA1C 8.2 (H) 03/04/2021      Assessment & Plan:  1. Cutaneous abscess of neck Abscess continues to be red and tender.  It is warm to palpate.  There is still a small amount of serosanguineous drainage.  Cleaned incision and apply clean dry dressing.  New prescription for bacitracin ointment pharmacy.  He should apply twice daily after cleaning and drying it.  For now, should keep covered with clean dry dressing.  An urgent referral to dermatology made today for further evaluation and treatment. - Ambulatory referral to Dermatology - bacitracin 500 UNIT/GM ointment; Apply 1 application topically 2 (two) times daily.  Dispense: 15 g; Refill: 0   Problem List Items Addressed This Visit   None Visit Diagnoses     Cutaneous abscess of other site    -  Primary   Relevant Medications   bacitracin 500 UNIT/GM ointment   Other Relevant Orders   Ambulatory referral to Dermatology       Meds ordered this encounter  Medications   bacitracin 500 UNIT/GM ointment    Sig: Apply 1 application topically 2 (two) times daily.    Dispense:  15 g    Refill:  0   This note was dictated using Systems analyst. Rapid proofreading was performed to expedite the delivery of the information. Despite proofreading, phonetic  errors will occur which are common with this voice recognition software. Please take this into consideration. If there are any concerns, please contact our office.    Follow-up: Return in about 1 week (around 06/15/2021) for Abscess, sooner if needed.    Ronnell Freshwater, NP

## 2021-06-08 NOTE — Progress Notes (Signed)
   New Patient Visit  Subjective  Tyler Wolf is a 65 y.o. male who presents for the following: Cyst (Pt states that he was seen for a cyst at his PCP that he has had for years but has recently become inflamed. PCP drained cyst and sent pt here because they believe there was a second cyst in same area.).  Very painful.    Objective  Well appearing patient in no apparent distress; mood and affect are within normal limits.  A focused examination was performed including neck. Relevant physical exam findings are noted in the Assessment and Plan.   Assessment & Plan  Epidermal cyst Neck - Posterior  Inflamed, symptomatic- I&D today  Continue Sulfa antibiotic as prescribed by PCP.  When finished Start Doxycycline 100 mg BID x 2 weeks.   Doxycycline should be taken with food to prevent nausea. Do not lay down for 30 minutes after taking. Be cautious with sun exposure and use good sun protection while on this medication. Pregnant women should not take this medication.    Incision and Drainage - Neck - Posterior Location: posterior neck  Informed Consent: Discussed risks (permanent scarring, light or dark discoloration, infection, pain, bleeding, bruising, redness, damage to adjacent structures, and recurrence of the lesion) and benefits of the procedure, as well as the alternatives.  Informed consent was obtained.  Preparation: The area was prepped with alcohol.  Anesthesia: Lidocaine 1% with epinephrine  Procedure Details: An incision was made overlying the lesion. The lesion drained white, chalky cyst material and blood.  A small amount of fluid was drained.   Pieces of cyst wall were extracted.    Antibiotic ointment and a sterile pressure dressing were applied. The patient tolerated procedure well.  Total number of lesions drained: 1  Plan: The patient was instructed on post-op care. Recommend OTC analgesia as needed for pain.   doxycycline (VIBRAMYCIN) 100 MG capsule - Neck  - Posterior Take 1 capsule (100 mg total) by mouth 2 (two) times daily for 14 days.  Return if symptoms worsen or fail to improve.  I, Harriett Sine, CMA, am acting as scribe for Brendolyn Patty, MD.  Documentation: I have reviewed the above documentation for accuracy and completeness, and I agree with the above.  Brendolyn Patty MD

## 2021-06-08 NOTE — Patient Instructions (Signed)
1Skin Abscess  A skin abscess is an infected area on or under your skin that contains a collection of pus and other material. An abscess may also be called a furuncle,carbuncle, or boil. An abscess can occur in or on almost any part of your body. Some abscesses break open (rupture) on their own. Most continue to get worse unless they are treated. The infection can spread deeper into the body and eventually into your blood, whichcan make you feel ill. Treatment usually involves draining the abscess. What are the causes? An abscess occurs when germs, like bacteria, pass through your skin and cause an infection. This may be caused by: A scrape or cut on your skin. A puncture wound through your skin, including a needle injection or insect bite. Blocked oil or sweat glands. Blocked and infected hair follicles. A cyst that forms beneath your skin (sebaceous cyst) and becomes infected. What increases the risk? This condition is more likely to develop in people who: Have a weak body defense system (immune system). Have diabetes. Have dry and irritated skin. Get frequent injections or use illegal IV drugs. Have a foreign body in a wound, such as a splinter. Have problems with their lymph system or veins. What are the signs or symptoms? Symptoms of this condition include: A painful, firm bump under the skin. A bump with pus at the top. This may break through the skin and drain. Other symptoms include: Redness surrounding the abscess site. Warmth. Swelling of the lymph nodes (glands) near the abscess. Tenderness. A sore on the skin. How is this diagnosed? This condition may be diagnosed based on: A physical exam. Your medical history. A sample of pus. This may be used to find out what is causing the infection. Blood tests. Imaging tests, such as an ultrasound, CT scan, or MRI. How is this treated? A small abscess that drains on its own may not need treatment. Treatment for larger abscesses  may include: Moist heat or heat pack applied to the area several times a day. A procedure to drain the abscess (incision and drainage). Antibiotic medicines. For a severe abscess, you may first get antibiotics through an IV and then change to antibiotics by mouth. Follow these instructions at home: Medicines  Take over-the-counter and prescription medicines only as told by your health care provider. If you were prescribed an antibiotic medicine, take it as told by your health care provider. Do not stop taking the antibiotic even if you start to feel better.  Abscess care  If you have an abscess that has not drained, apply heat to the affected area. Use the heat source that your health care provider recommends, such as a moist heat pack or a heating pad. Place a towel between your skin and the heat source. Leave the heat on for 20-30 minutes. Remove the heat if your skin turns bright red. This is especially important if you are unable to feel pain, heat, or cold. You may have a greater risk of getting burned. Follow instructions from your health care provider about how to take care of your abscess. Make sure you: Cover the abscess with a bandage (dressing). Change your dressing or gauze as told by your health care provider. Wash your hands with soap and water before you change the dressing or gauze. If soap and water are not available, use hand sanitizer. Check your abscess every day for signs of a worsening infection. Check for: More redness, swelling, or pain. More fluid or blood. Warmth. More  pus or a bad smell.  General instructions To avoid spreading the infection: Do not share personal care items, towels, or hot tubs with others. Avoid making skin contact with other people. Keep all follow-up visits as told by your health care provider. This is important. Contact a health care provider if you have: More redness, swelling, or pain around your abscess. More fluid or blood coming  from your abscess. Warm skin around your abscess. More pus or a bad smell coming from your abscess. A fever. Muscle aches. Chills or a general ill feeling. Get help right away if you: Have severe pain. See red streaks on your skin spreading away from the abscess. Summary A skin abscess is an infected area on or under your skin that contains a collection of pus and other material. A small abscess that drains on its own may not need treatment. Treatment for larger abscesses may include having a procedure to drain the abscess and taking an antibiotic. This information is not intended to replace advice given to you by your health care provider. Make sure you discuss any questions you have with your healthcare provider. Document Revised: 01/23/2019 Document Reviewed: 11/15/2017 Elsevier Patient Education  2022 [employer]  

## 2021-06-22 ENCOUNTER — Other Ambulatory Visit: Payer: Self-pay

## 2021-06-22 ENCOUNTER — Ambulatory Visit: Payer: BC Managed Care – PPO | Admitting: Endocrinology

## 2021-06-22 VITALS — BP 140/60 | HR 80 | Ht 67.0 in | Wt 188.2 lb

## 2021-06-22 DIAGNOSIS — Z794 Long term (current) use of insulin: Secondary | ICD-10-CM

## 2021-06-22 DIAGNOSIS — E119 Type 2 diabetes mellitus without complications: Secondary | ICD-10-CM

## 2021-06-22 LAB — POCT GLYCOSYLATED HEMOGLOBIN (HGB A1C): Hemoglobin A1C: 8 % — AB (ref 4.0–5.6)

## 2021-06-22 MED ORDER — INSULIN NPH (HUMAN) (ISOPHANE) 100 UNIT/ML ~~LOC~~ SUSP: 55.0000 [IU] | SUBCUTANEOUS | 11 refills | Status: DC

## 2021-06-22 MED ORDER — RYBELSUS 3 MG PO TABS: 3.0000 mg | ORAL_TABLET | Freq: Every day | ORAL | 11 refills | Status: DC

## 2021-06-22 NOTE — Patient Instructions (Signed)
I have sent a prescription to your pharmacy, to add Rybelsus.  Please reduce the insulin to 55 units each morning.   Please continue the same metformin.   On this type of insulin schedule, you should eat meals on a regular schedule.  If a meal is missed or significantly delayed, your blood sugar could go low.   If it is good, please come back for a follow-up appointment in 2-3 months.   check your blood sugar twice a day.  vary the time of day when you check, between before the 3 meals, and at bedtime.  also check if you have symptoms of your blood sugar being too high or too low.  please keep a record of the readings and bring it to your next appointment here (or you can bring the meter itself).  You can write it on any piece of paper.  please call us sooner if your blood sugar goes below 70, or if you have a lot of readings over 200.

## 2021-06-22 NOTE — Progress Notes (Signed)
Subjective:    Patient ID: Tyler Wolf, male    DOB: November 25, 1955, 65 y.o.   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   Dx'ed: [id number] Complications: none Therapy: insulin since 2017, and metformin.   DKA: never Severe hypoglycemia: never Pancreatitis: never Pancreatic imaging: normal on 2007 CT SDOH: due to missing insulin, he is not a candidate for multiple daily injections.   Other: he changed Lantus to NPH, due to pattern of cbg's; he declines to add more non-insulin rx.  Interval history: Pt says he misses the insulin approx once per week.  no cbg record, but states cbg varies from 74-210.  It is in general higher as the day goes on.  He last took steroids 3 mos ago, for back pain. He eats breakfast at 6-7AM.  He takes insulin at 6AM.  He has intermitt tremor at 10AM.  Eating helps.   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   HYPERLIPIDEMIA, MIXED 05/27/2007   HYPERTENSION 05/17/2007   TOBACCO ABUSE 05/27/2007    Past Surgical History:  Procedure Laterality Date   COLONOSCOPY      Social History   Socioeconomic History   Marital status: Married    Spouse name: Not on file   Number of children: Not on file   Years of education: Not on file   Highest education level: Not on file  Occupational History   Not on file  Tobacco Use   Smoking status: Every Day    Packs/day: 0.55    Years: 51.00    Pack years: 28.05    Types: Cigarettes   Smokeless tobacco: Never   Tobacco comments:    electronic cigarettes, 6/1/2020pt states on and off  Vaping Use   Vaping Use: Never used  Substance and Sexual Activity   Alcohol use: No    Alcohol/week: 0.0 standard drinks   Drug use: No   Sexual activity: Not Currently    Birth control/protection:  None  Other Topics Concern   Not on file  Social History Narrative   Not on file   Social Determinants of Health   Financial Resource Strain: Not on file  Food Insecurity: Not on file  Transportation Needs: Not on file  Physical Activity: Not on file  Stress: Not on file  Social Connections: Not on file  Intimate Partner Violence: Not on file    Current Outpatient Medications on File Prior to Visit  Medication Sig Dispense Refill   aspirin 81 MG tablet Take 81 mg by mouth daily.     atenolol-chlorthalidone (TENORETIC) 50-25 MG tablet Take 1 tablet by mouth daily. 90 tablet 1   atorvastatin (LIPITOR) 80 MG tablet TAKE 1 TABLET BY MOUTH DAILY. 90 tablet 1   bacitracin 500 UNIT/GM ointment Apply 1 application topically 2 (two) times daily. 15 g 0   glucose blood (CONTOUR NEXT TEST) test strip 1 each by Other route 2 (two) times daily. And lancets 2/day 200 each 3   Insulin Syringe-Needle U-100 (INSULIN SYRINGE 1CC/30GX5/16") 30G X 5/16" 1 ML MISC 1 Syringe by Does not apply route daily. Use to inject insulin daily 100 each 0   lisinopril (ZESTRIL) 5 MG tablet TAKE 1 TABLET BY MOUTH DAILY. 90 tablet 0   metFORMIN (GLUCOPHAGE)  1000 MG tablet TAKE 1 TABLET BY MOUTH TWICE A DAY 180 tablet 4   tizanidine (ZANAFLEX) 2 MG capsule Take 1 capsule (2 mg total) by mouth 3 (three) times daily. 21 capsule 0   No current facility-administered medications on file prior to visit.    Allergies  Allergen Reactions   Latex     REACTION: Rash    Family History  Problem Relation Age of Onset   Diabetes Mother    Heart attack Father    Diabetes Father    Colon polyps Neg Hx    Colon cancer Neg Hx    Esophageal cancer Neg Hx     BP 140/60 (BP Location: Right Arm, Patient Position: Sitting, Cuff Size: Normal)   Pulse 80   Ht '5\' 7"'$  (1.702 m)   Wt 188 lb 3.2 oz (85.4 kg)   BMI 29.48 kg/m    Review of Systems He denies hypoglycemia.      Objective:   Physical Exam VITAL SIGNS:  See vs  page GENERAL: no distress Pulses: dorsalis pedis intact bilat.   MSK: no deformity of the feet CV: no leg edema Skin:  no ulcer on the feet.  normal color and temp on the feet.   Neuro: sensation is intact to touch on the feet.  Ext: there is bilateral onychomycosis of the toenails.    A1c=8.0%     Assessment & Plan:  Insulin-requiring type 2 DM: uncontrolled Tremor, prob due to insulin.  We'll favor GLP rx over insulin.  Patient Instructions  I have sent a prescription to your pharmacy, to add Rybelsus.  Please reduce the insulin to 55 units each morning.   Please continue the same metformin.   On this type of insulin schedule, you should eat meals on a regular schedule.  If a meal is missed or significantly delayed, your blood sugar could go low.   If it is good, please come back for a follow-up appointment in 2-3 months.   check your blood sugar twice a day.  vary the time of day when you check, between before the 3 meals, and at bedtime.  also check if you have symptoms of your blood sugar being too high or too low.  please keep a record of the readings and bring it to your next appointment here (or you can bring the meter itself).  You can write it on any piece of paper.  please call us sooner if your blood sugar goes below 70, or if you have a lot of readings over 200.

## 2021-06-23 ENCOUNTER — Ambulatory Visit (INDEPENDENT_AMBULATORY_CARE_PROVIDER_SITE_OTHER): Payer: BC Managed Care – PPO | Admitting: Nurse Practitioner

## 2021-06-23 ENCOUNTER — Encounter: Payer: Self-pay | Admitting: Nurse Practitioner

## 2021-06-23 VITALS — BP 138/62 | HR 73 | Temp 98.3°F | Ht 67.0 in | Wt 186.4 lb

## 2021-06-23 DIAGNOSIS — L0211 Cutaneous abscess of neck: Secondary | ICD-10-CM | POA: Diagnosis not present

## 2021-06-23 NOTE — Progress Notes (Signed)
Established Patient Office Visit  Subjective:  Patient ID: Tyler Wolf, male    DOB: September 16, 1956  Age: 65 y.o. MRN: [id number]  CC:  Chief Complaint  Patient presents with   Abscess    HPI Tyler Wolf presents for an acute visit.  He did see dermatology for an abscess on the lower right side of his neck.  They performed a second I&D of the abscess.  They advised him to complete the initial antibiotic that was prescribed by this office which was Bactrim DS.  They wanted him to follow that with doxycycline twice daily.  He states he is currently taking the doxycycline.  He has approximately 10 days left of his antibiotic.  He is concerned that the abscess still has some cloudy, purulent appearing fluid which is draining from the incision area.  The abscess is slightly tender and red.  It is much smaller in size than when he was seen previously.  He denies fever chills body aches or pain.  He denies headaches.  He has successfully returned to work.  He does not have a follow-up scheduled with dermatology.  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   HYPERLIPIDEMIA, MIXED 05/27/2007   HYPERTENSION 05/17/2007   TOBACCO ABUSE 05/27/2007    Past Surgical History:  Procedure Laterality Date   COLONOSCOPY      Family History  Problem Relation Age of Onset   Diabetes Mother    Heart attack Father    Diabetes Father    Colon polyps Neg Hx    Colon cancer Neg Hx    Esophageal cancer Neg Hx     Social History   Socioeconomic History   Marital status: Married    Spouse name: Not on file   Number of children: Not on file   Years of education: Not on file   Highest education level: Not on file  Occupational History   Not on file  Tobacco Use   Smoking status: Every Day    Packs/day: 0.55    Years: 51.00    Pack years: 28.05    Types: Cigarettes   Smokeless  tobacco: Never   Tobacco comments:    electronic cigarettes, 6/1/2020pt states on and off  Vaping Use   Vaping Use: Never used  Substance and Sexual Activity   Alcohol use: No    Alcohol/week: 0.0 standard drinks   Drug use: No   Sexual activity: Not Currently    Birth control/protection: None  Other Topics Concern   Not on file  Social History Narrative   Not on file   Social Determinants of Health   Financial Resource Strain: Not on file  Food Insecurity: Not on file  Transportation Needs: Not on file  Physical Activity: Not on file  Stress: Not on file  Social Connections: Not on file  Intimate Partner Violence: Not on file    Outpatient Medications Prior to Visit  Medication Sig Dispense Refill   aspirin 81 MG tablet Take 81 mg by mouth daily.     atenolol-chlorthalidone (TENORETIC) 50-25 MG tablet Take 1 tablet by mouth daily. 90 tablet 1   atorvastatin (LIPITOR) 80 MG tablet TAKE 1 TABLET BY MOUTH DAILY. 90 tablet 1   bacitracin 500 UNIT/GM ointment Apply 1 application topically 2 (two) times daily. 15 g 0   glucose blood (CONTOUR NEXT TEST) test  strip 1 each by Other route 2 (two) times daily. And lancets 2/day 200 each 3   insulin NPH Human (HUMULIN N) 100 UNIT/ML injection Inject 0.55 mLs (55 Units total) into the skin every morning. 20 mL 11   Insulin Syringe-Needle U-100 (INSULIN SYRINGE 1CC/30GX5/16") 30G X 5/16" 1 ML MISC 1 Syringe by Does not apply route daily. Use to inject insulin daily 100 each 0   lisinopril (ZESTRIL) 5 MG tablet TAKE 1 TABLET BY MOUTH DAILY. 90 tablet 0   metFORMIN (GLUCOPHAGE) 1000 MG tablet TAKE 1 TABLET BY MOUTH TWICE A DAY 180 tablet 4   Semaglutide (RYBELSUS) 3 MG TABS Take 3 mg by mouth daily. 30 tablet 11   tizanidine (ZANAFLEX) 2 MG capsule Take 1 capsule (2 mg total) by mouth 3 (three) times daily. 21 capsule 0   No facility-administered medications prior to visit.    Allergies  Allergen Reactions   Latex     REACTION: Rash     ROS Review of Systems  Constitutional:  Positive for fatigue. Negative for activity change, chills and fever.  HENT:  Negative for congestion, postnasal drip, rhinorrhea, sinus pressure, sinus pain, sneezing and sore throat.   Eyes: Negative.   Respiratory:  Negative for cough, shortness of breath and wheezing.   Cardiovascular:  Negative for chest pain and palpitations.  Gastrointestinal:  Negative for constipation, diarrhea, nausea and vomiting.  Endocrine: Negative for cold intolerance, heat intolerance, polydipsia and polyuria.  Genitourinary:  Negative for dysuria, frequency and urgency.  Musculoskeletal:  Negative for back pain and myalgias.  Skin:  Negative for rash.       I&D incision area at the base of the right side of his neck and is draining small amount of cloudy, purulent fluid.  Is slightly tender and much smaller than previously noted.  Allergic/Immunologic: Negative for environmental allergies.  Neurological:  Negative for dizziness, weakness and headaches.  Psychiatric/Behavioral:  The patient is not nervous/anxious.      Objective:    Physical Exam Vitals and nursing note reviewed.  Constitutional:      Appearance: Normal appearance. He is well-developed.  HENT:     Head: Normocephalic and atraumatic.     Nose: Nose normal.     Mouth/Throat:     Mouth: Mucous membranes are moist.  Eyes:     Extraocular Movements: Extraocular movements intact.     Conjunctiva/sclera: Conjunctivae normal.     Pupils: Pupils are equal, round, and reactive to light.  Cardiovascular:     Rate and Rhythm: Normal rate and regular rhythm.     Pulses: Normal pulses.     Heart sounds: Normal heart sounds.  Pulmonary:     Effort: Pulmonary effort is normal.     Breath sounds: Normal breath sounds.  Abdominal:     Palpations: Abdomen is soft.  Musculoskeletal:        General: Normal range of motion.     Cervical back: Normal range of motion and neck supple.  Lymphadenopathy:      Cervical: No cervical adenopathy.  Skin:    General: Skin is warm and dry.     Capillary Refill: Capillary refill takes less than 2 seconds.     Comments: There is a approximately 1 inch horizontal incision across the base of the base of the neck adjacent with the right shoulder.  It is slightly red, warm, and tender to palpate.  There is a small amount of purulent fluid draining from the incision.  It is covered with a large Band-Aid.  No evidence of cellulitis or worsening inflammation at this time.  Neurological:     General: No focal deficit present.     Mental Status: He is alert and oriented to person, place, and time.  Psychiatric:        Mood and Affect: Mood normal.        Behavior: Behavior normal.        Thought Content: Thought content normal.        Judgment: Judgment normal.   Today's Vitals   06/23/21 1555  BP: 138/62  Pulse: 73  Temp: 98.3 F (36.8 C)  SpO2: 98%  Weight: 186 lb 6.4 oz (84.6 kg)  Height: '5\' 7"'  (1.702 m)   Body mass index is 29.19 kg/m.  Wt Readings from Last 3 Encounters:  06/23/21 186 lb 6.4 oz (84.6 kg)  06/22/21 188 lb 3.2 oz (85.4 kg)  06/08/21 182 lb 4.8 oz (82.7 kg)     Health Maintenance Due  Topic Date Due   HIV Screening  Never done   Hepatitis C Screening  Never done   COVID-19 Vaccine (3 - Booster for Pfizer series) 07/04/2020   OPHTHALMOLOGY EXAM  11/09/2020   INFLUENZA VACCINE  05/16/2021    There are no preventive care reminders to display for this patient.  Lab Results  Component Value Date   TSH 2.110 03/04/2021   Lab Results  Component Value Date   WBC 7.9 03/04/2021   HGB 16.5 03/04/2021   HCT 50.5 03/04/2021   MCV 95 03/04/2021   PLT 244 03/04/2021   Lab Results  Component Value Date   NA 138 03/04/2021   K 4.3 03/04/2021   CO2 25 03/04/2021   GLUCOSE 180 (H) 03/04/2021   BUN 20 03/04/2021   CREATININE 0.85 03/04/2021   BILITOT 0.5 03/04/2021   ALKPHOS 80 03/04/2021   AST 13 03/04/2021   ALT 16  03/04/2021   PROT 7.3 03/04/2021   ALBUMIN 4.6 03/04/2021   CALCIUM 9.6 03/04/2021   EGFR 98 03/04/2021   GFR 103.03 07/01/2018   Lab Results  Component Value Date   CHOL 137 03/04/2021   Lab Results  Component Value Date   HDL 41 03/04/2021   Lab Results  Component Value Date   LDLCALC 75 03/04/2021   Lab Results  Component Value Date   TRIG 115 03/04/2021   Lab Results  Component Value Date   CHOLHDL 3.3 03/04/2021   Lab Results  Component Value Date   HGBA1C 8.0 (A) 06/22/2021      Assessment & Plan:  1. Cutaneous abscess of neck The patient recently had a second I&D.  It appears to be healing well.  Draining small amount purulent fluid.  No further cellulitis or inflammation present.  Abscess itself is smaller in size.  Patient currently on doxycycline 100 mg twice daily.  This was prescribed per dermatology.  Advised him to finish this prescription.  He should noticed that drainage actually decreases and stops.  Inflammation which and tenderness should subside over the next week or so.  If symptoms get worse or do not improve over the next 7 to 10 days, he should contact our office for further evaluation.  Patient voiced understanding and agreement with this plan.  Problem List Items Addressed This Visit       Musculoskeletal and Integument   Cutaneous abscess of neck - Primary   This note was dictated using Dragon Voice Recognition Software.  Rapid proofreading was performed to expedite the delivery of the information. Despite proofreading, phonetic errors will occur which are common with this voice recognition software. Please take this into consideration. If there are any concerns, please contact our office.     Follow-up: Return for as scheduled.    Ronnell Freshwater, NP

## 2021-06-23 NOTE — Patient Instructions (Signed)
Finish current dosing of doxycycline. Contact office if you continue to have cloudy drainage from incision after you finish antibiotics. May need to follow up with another round of Bactrim DS. Let us know if you have questions.

## 2021-07-12 ENCOUNTER — Other Ambulatory Visit: Payer: Self-pay

## 2021-07-12 ENCOUNTER — Ambulatory Visit: Payer: BC Managed Care – PPO | Admitting: Physician Assistant

## 2021-07-12 ENCOUNTER — Encounter: Payer: Self-pay | Admitting: Physician Assistant

## 2021-07-12 VITALS — BP 122/80 | HR 66 | Temp 97.7°F | Ht 67.0 in | Wt 184.4 lb

## 2021-07-12 DIAGNOSIS — E782 Mixed hyperlipidemia: Secondary | ICD-10-CM | POA: Diagnosis not present

## 2021-07-12 DIAGNOSIS — I152 Hypertension secondary to endocrine disorders: Secondary | ICD-10-CM

## 2021-07-12 DIAGNOSIS — Z23 Encounter for immunization: Secondary | ICD-10-CM

## 2021-07-12 DIAGNOSIS — E1159 Type 2 diabetes mellitus with other circulatory complications: Secondary | ICD-10-CM | POA: Diagnosis not present

## 2021-07-12 DIAGNOSIS — E559 Vitamin D deficiency, unspecified: Secondary | ICD-10-CM

## 2021-07-12 DIAGNOSIS — Z794 Long term (current) use of insulin: Secondary | ICD-10-CM

## 2021-07-12 DIAGNOSIS — E1169 Type 2 diabetes mellitus with other specified complication: Secondary | ICD-10-CM | POA: Diagnosis not present

## 2021-07-12 NOTE — Assessment & Plan Note (Signed)
-  Followed by Endocrinology. -Recent A1c mildly improved from 8.2 to 8.0, patient was started on GLP-1 therapy and tolerating medication without issues. Continue current medication regimen. -Recommend to resume ambulatory glucose monitoring. -Continue with low carbohydrate and glucose diet.

## 2021-07-12 NOTE — Progress Notes (Signed)
Established Patient Office Visit  Subjective:  Patient ID: Tyler Wolf, male    DOB: Aug 10, 1956  Age: 65 y.o. MRN: [id number]  CC:  Chief Complaint  Patient presents with   Follow-up   Diabetes   Hypertension   Hyperlipidemia    HPI RICO MASSAR presents for follow up on diabetes mellitus, hypertension and hyperlipidemia.  Diabetes mellitus: Pt denies increased urination or thirst. Pt reports medication compliance. Does report a few episodes where he has felt his sugar is low which improves after having some sweet to eat . Not checking glucose at home. Patient reports has worked on reducing sugar intake (sodas).  HTN: Pt denies chest pain, palpitations, dizziness, shortness of breath or lower extremity swelling. Taking medication as directed without side effects. Not checking BP at home. Pt follows a low salt diet. Drinks unsweet tea and water.  HLD: Pt taking medication as directed without issues. Denies side effects including myalgias and RUQ pain.      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   HYPERLIPIDEMIA, MIXED 05/27/2007   HYPERTENSION 05/17/2007   TOBACCO ABUSE 05/27/2007    Past Surgical History:  Procedure Laterality Date   COLONOSCOPY      Family History  Problem Relation Age of Onset   Diabetes Mother    Heart attack Father    Diabetes Father    Colon polyps Neg Hx    Colon cancer Neg Hx    Esophageal cancer Neg Hx     Social History   Socioeconomic History   Marital status: Married    Spouse name: Not on file   Number of children: Not on file   Years of education: Not on file   Highest education level: Not on file  Occupational History   Not on file  Tobacco Use   Smoking status: Every Day    Packs/day: 0.55    Years: 51.00    Pack years: 28.05    Types: Cigarettes   Smokeless tobacco: Never   Tobacco comments:     electronic cigarettes, 6/1/2020pt states on and off  Vaping Use   Vaping Use: Never used  Substance and Sexual Activity   Alcohol use: No    Alcohol/week: 0.0 standard drinks   Drug use: No   Sexual activity: Not Currently    Birth control/protection: None  Other Topics Concern   Not on file  Social History Narrative   Not on file   Social Determinants of Health   Financial Resource Strain: Not on file  Food Insecurity: Not on file  Transportation Needs: Not on file  Physical Activity: Not on file  Stress: Not on file  Social Connections: Not on file  Intimate Partner Violence: Not on file    Outpatient Medications Prior to Visit  Medication Sig Dispense Refill   aspirin 81 MG tablet Take 81 mg by mouth daily.     atenolol-chlorthalidone (TENORETIC) 50-25 MG tablet Take 1 tablet by mouth daily. 90 tablet 1   atorvastatin (LIPITOR) 80 MG tablet TAKE 1 TABLET BY MOUTH DAILY. 90 tablet 1   bacitracin 500 UNIT/GM ointment Apply 1 application topically 2 (two) times daily. 15 g 0   glucose blood (CONTOUR NEXT TEST) test strip 1 each by Other route 2 (two) times daily. And lancets 2/day 200 each 3   insulin NPH Human (HUMULIN N) 100  UNIT/ML injection Inject 0.55 mLs (55 Units total) into the skin every morning. 20 mL 11   Insulin Syringe-Needle U-100 (INSULIN SYRINGE 1CC/30GX5/16") 30G X 5/16" 1 ML MISC 1 Syringe by Does not apply route daily. Use to inject insulin daily 100 each 0   lisinopril (ZESTRIL) 5 MG tablet TAKE 1 TABLET BY MOUTH DAILY. 90 tablet 0   metFORMIN (GLUCOPHAGE) 1000 MG tablet TAKE 1 TABLET BY MOUTH TWICE A DAY 180 tablet 4   Semaglutide (RYBELSUS) 3 MG TABS Take 3 mg by mouth daily. 30 tablet 11   tizanidine (ZANAFLEX) 2 MG capsule Take 1 capsule (2 mg total) by mouth 3 (three) times daily. 21 capsule 0   No facility-administered medications prior to visit.    Allergies  Allergen Reactions   Latex     REACTION: Rash    ROS Review of Systems A  fourteen system review of systems was performed and found to be positive as per HPI.   Objective:    Physical Exam General:  Well Developed, well nourished, appropriate for stated age.  Neuro:  Alert and oriented,  extra-ocular muscles intact  HEENT:  Normocephalic, atraumatic, neck supple  Skin:  no gross rash, warm, pink. Cardiac:  RRR, S1 S2 Respiratory:  CTA B/L, Not using accessory muscles, speaking in full sentences- unlabored. Vascular:  Ext warm, no cyanosis apprec.; cap RF less 2 sec. No edema  Psych:  No HI/SI, judgement and insight good, Euthymic mood. Full Affect.  BP (!) 161/76   Pulse 66   Temp 97.7 F (36.5 C)   Ht _0  (1.702 m)   Wt 184 lb 6.4 oz (83.6 kg)   SpO2 99%   BMI 28.88 kg/m  Wt Readings from Last 3 Encounters:  07/12/21 184 lb 6.4 oz (83.6 kg)  06/23/21 186 lb 6.4 oz (84.6 kg)  06/22/21 188 lb 3.2 oz (85.4 kg)     Health Maintenance Due  Topic Date Due   HIV Screening  Never done   Hepatitis C Screening  Never done   COVID-19 Vaccine (3 - Booster for Pfizer series) 07/04/2020   OPHTHALMOLOGY EXAM  11/09/2020   INFLUENZA VACCINE  05/16/2021    There are no preventive care reminders to display for this patient.  Lab Results  Component Value Date   TSH 2.110 03/04/2021   Lab Results  Component Value Date   WBC 7.9 03/04/2021   HGB 16.5 03/04/2021   HCT 50.5 03/04/2021   MCV 95 03/04/2021   PLT 244 03/04/2021   Lab Results  Component Value Date   NA 138 03/04/2021   K 4.3 03/04/2021   CO2 25 03/04/2021   GLUCOSE 180 (H) 03/04/2021   BUN 20 03/04/2021   CREATININE 0.85 03/04/2021   BILITOT 0.5 03/04/2021   ALKPHOS 80 03/04/2021   AST 13 03/04/2021   ALT 16 03/04/2021   PROT 7.3 03/04/2021   ALBUMIN 4.6 03/04/2021   CALCIUM 9.6 03/04/2021   EGFR 98 03/04/2021   GFR 103.03 07/01/2018   Lab Results  Component Value Date   CHOL 137 03/04/2021   Lab Results  Component Value Date   HDL 41 03/04/2021   Lab Results   Component Value Date   LDLCALC 75 03/04/2021   Lab Results  Component Value Date   TRIG 115 03/04/2021   Lab Results  Component Value Date   CHOLHDL 3.3 03/04/2021   Lab Results  Component Value Date   HGBA1C 8.0 (A) 06/22/2021  Assessment & Plan:   Problem List Items Addressed This Visit       Cardiovascular and Mediastinum   Hypertension associated with type 2 diabetes mellitus ([place])    -BP initially elevated, BP recheck improved and stable. -Continue current medication regimen. -Will collect CMP for medication monitoring. -Will continue to monitor.      Relevant Orders   Comp Met (CMET)   CBC w/Diff     Endocrine   Diabetes ([place]) - Primary    -Followed by Endocrinology. -Recent A1c mildly improved from 8.2 to 8.0, patient was started on GLP-1 therapy and tolerating medication without issues. Continue current medication regimen. -Recommend to resume ambulatory glucose monitoring. -Continue with low carbohydrate and glucose diet.       Relevant Orders   Comp Met (CMET)   CBC w/Diff   Mixed diabetic hyperlipidemia associated with type 2 diabetes mellitus ([place])    -Last lipid panel: total cholesterol 137, triglycerides 115, HDL 41, LDL 75 (goal <70). -Continue current medication regimen. -Will repeat lipid panel and hepatic function. -Discussed low fat diet.      Relevant Orders   Lipid Profile   Other Visit Diagnoses     Need for influenza vaccination       Relevant Orders   Flu Vaccine QUAD 61moIM (Fluarix, Fluzone & Alfiuria Quad PF)   Vitamin D deficiency       Relevant Orders   Vitamin D (25 hydroxy)      Vitamin D deficiency: -Last Vitamin D 19.9, will repeat Vitamin D.  -Patient reports taking Vitamin D supplement, unsure of dosage.   No orders of the defined types were placed in this encounter.   Follow-up: Return in about 4 months (around 11/11/2021) for DM, HTN, HLD .    MLorrene Reid PA-C

## 2021-07-12 NOTE — Assessment & Plan Note (Signed)
-  Last lipid panel: total cholesterol 137, triglycerides 115, HDL 41, LDL 75 (goal <70). -Continue current medication regimen. -Will repeat lipid panel and hepatic function. -Discussed low fat diet.

## 2021-07-12 NOTE — Patient Instructions (Signed)
Type 2 Diabetes Mellitus, Self-Care, Adult When you have type 2 diabetes (type 2 diabetes mellitus), you must make sure your blood sugar (glucose) stays in a healthy range. You can do this with: Nutrition. Exercise. Lifestyle changes. Medicines or insulin, if needed. Support from your doctors and others. What are the risks? Having type 2 diabetes can raise your risk for other long-term (chronic) health problems. You may get medicines to help prevent these problems. How to stay aware of your blood sugar  Check your blood sugar level every day, as often as told. Have your A1C (hemoglobin A1C) level checked two or more times a year. Have it checked more often if told. Your doctor will set personal treatment goals for you. In general, you should have these blood sugar levels: Before meals: 80-130 mg/dL (4.4-7.2 mmol/L). After meals: below 180 mg/dL (10 mmol/L). A1C: less than 7%. How to manage high and low blood sugar Symptoms of high blood sugar High blood sugar is also called hyperglycemia. Know the symptoms of high blood sugar. These may include: More thirst. Hunger. Feeling very tired. Needing to pee (urinate) more often than normal. Seeing things blurry. Symptoms of low blood sugar Low blood sugar is also called hypoglycemia. This is when blood sugar is at or below 70 mg/dL (3.9 mmol/L). Symptoms may include: Hunger. Feeling worried or nervous (anxious). Feeling sweaty and cold to the touch (clammy). Being dizzy or light-headed. Feeling sleepy. A fast heartbeat. Feeling grouchy (irritable). Tingling or loss of feeling (numbness) around your mouth, lips, or tongue. Restless sleep. Diabetes medicines can cause low blood sugar. You are more at risk: While you exercise. After exercise. During sleep. When you are sick. When you skip meals or do not eat for a long time. Treating low blood sugar If you think you have low blood sugar, eat or drink something sugary right away. Keep  15 grams of a fast-acting carb (carbohydrate) with you all the time. Make sure your family and friends know how to treat you if you cannot treat yourself. Treating very low blood sugar Severe hypoglycemia is when your blood sugar is at or below 54 mg/dL (3 mmol/L). Severe hypoglycemia is an emergency. Get medical help right away. Call your local emergency services (911 in the [place] Do not wait to see if the symptoms will go away. Do not drive yourself to the hospital. You may need a glucagon shot if you have very low blood sugar and you cannot eat or drink. Have a family member or friend learn how to check your blood sugar and how to give you a glucagon shot. Ask your doctor if you should have a kit for glucagon shots. Follow these instructions at home: Medicines Take prescribed insulin or diabetes medicines as told by your health care provider. Do not run out of insulin or other medicines. Plan ahead. If you use insulin, change the amount you take based on how active you are and what foods you eat. Your doctor will tell you how to do this. Take over-the-counter and prescription medicines only as told by your doctor. Eating and drinking  Eat healthy foods. These include: Low-fat (lean) proteins. Complex carbs, such as whole grains. Fresh fruits and vegetables. Low-fat dairy products. Healthy fats. Meet with a food expert (dietitian) to make an eating plan. Follow instructions from your doctor about what you cannot eat or drink. Drink enough fluid to keep your pee (urine) pale yellow. Keep track of carbs that you eat. Read food labels and   learn serving sizes of foods. Follow your sick-day plan when you cannot eat or drink as normal. Make this plan with your doctor so it is ready to use. Activity Exercise as told by your doctor. You may need to: Do stretching and strength exercises two or more times a week. Do 150 minutes or more of exercise each week that makes your heart beat faster and  makes you sweat. Spread out your exercise over 3 or more days a week. Do not go more than 2 days in a row without exercise. Talk with your doctor before you start a new exercise. Your doctor may tell you to change: How much insulin or medicines you take. How much food you eat. Lifestyle Do not smoke or use any products that contain nicotine or tobacco. If you need help quitting, ask your doctor. If you drink alcohol and your doctor says that it is safe for you: Limit how much you have to: 0-1 drink a day for women who are not pregnant. 0-2 drinks a day for men. Know how much alcohol is in your drink. In the [place] one drink equals one 12 oz bottle of beer (355 mL), one 5 oz glass of wine (148 mL), or one 1 oz glass of hard liquor (44 mL). Learn to deal with stress. If you need help, ask your doctor. Body care  Stay up to date with your shots (immunizations). Have your eyes and feet checked by a doctor as often as told. Check your skin and feet every day. Check for cuts, bruises, redness, blisters, or sores. Brush your teeth and gums two times a day. Floss one or more times a day. Go to the dentist one or more times every 6 months. Stay at a healthy weight. General instructions Share your diabetes care plan with: Your work or school. People you live with. Carry a card or wear jewelry that says you have diabetes. Keep all follow-up visits. Questions to ask your doctor Do I need to meet with a certified expert in diabetes education and care? Where can I find a support group? Where to find more information For help and guidance and more information about diabetes, please go to: American Diabetes Association: www.diabetes.org American Association of Diabetes Care and Education Specialists: www.diabeteseducator.org International Diabetes Federation: www.idf.org Summary When you have type 2 diabetes, you must make sure your blood sugar (glucose) stays in a healthy range. You can do this  with nutrition, exercise, medicines and insulin, and support from doctors and others. Check your blood sugar every day, or as often as told. Having diabetes can raise your risk for other long-term health problems. You may get medicines to help prevent these problems. Share your diabetes management plan with people at work, school, and home. Keep all follow-up visits. This information is not intended to replace advice given to you by your health care provider. Make sure you discuss any questions you have with your health care provider. Document Revised: 12/27/2020 Document Reviewed: 12/27/2020 Elsevier Patient Education  2022 Elsevier Inc.  

## 2021-07-12 NOTE — Assessment & Plan Note (Signed)
-  BP initially elevated, BP recheck improved and stable. -Continue current medication regimen. -Will collect CMP for medication monitoring. -Will continue to monitor.

## 2021-07-13 ENCOUNTER — Other Ambulatory Visit: Payer: Self-pay

## 2021-07-13 LAB — CBC WITH DIFFERENTIAL/PLATELET
Basophils Absolute: 0 10*3/uL (ref 0.0–0.2)
Basos: 1 %
EOS (ABSOLUTE): 0.2 10*3/uL (ref 0.0–0.4)
Eos: 3 %
Hematocrit: 45.3 % (ref 37.5–51.0)
Hemoglobin: 15.5 g/dL (ref 13.0–17.7)
Immature Grans (Abs): 0.1 10*3/uL (ref 0.0–0.1)
Immature Granulocytes: 1 %
Lymphocytes Absolute: 1.8 10*3/uL (ref 0.7–3.1)
Lymphs: 25 %
MCH: 31.6 pg (ref 26.6–33.0)
MCHC: 34.2 g/dL (ref 31.5–35.7)
MCV: 92 fL (ref 79–97)
Monocytes Absolute: 0.7 10*3/uL (ref 0.1–0.9)
Monocytes: 9 %
Neutrophils Absolute: 4.6 10*3/uL (ref 1.4–7.0)
Neutrophils: 61 %
Platelets: 268 10*3/uL (ref 150–450)
RBC: 4.9 x10E6/uL (ref 4.14–5.80)
RDW: 13.7 % (ref 11.6–15.4)
WBC: 7.4 10*3/uL (ref 3.4–10.8)

## 2021-07-13 LAB — LIPID PANEL
Chol/HDL Ratio: 5.1 ratio — ABNORMAL HIGH (ref 0.0–5.0)
Cholesterol, Total: 199 mg/dL (ref 100–199)
HDL: 39 mg/dL — ABNORMAL LOW (ref >39)
LDL Chol Calc (NIH): 133 mg/dL — ABNORMAL HIGH (ref 0–99)
Triglycerides: 151 mg/dL — ABNORMAL HIGH (ref 0–149)
VLDL Cholesterol Cal: 27 mg/dL (ref 5–40)

## 2021-07-13 LAB — COMPREHENSIVE METABOLIC PANEL
ALT: 22 IU/L (ref 0–44)
AST: 16 IU/L (ref 0–40)
Albumin/Globulin Ratio: 1.8 (ref 1.2–2.2)
Albumin: 4.8 g/dL (ref 3.8–4.8)
Alkaline Phosphatase: 69 IU/L (ref 44–121)
BUN/Creatinine Ratio: 20 (ref 10–24)
BUN: 16 mg/dL (ref 8–27)
Bilirubin Total: 0.6 mg/dL (ref 0.0–1.2)
CO2: 26 mmol/L (ref 20–29)
Calcium: 9.7 mg/dL (ref 8.6–10.2)
Chloride: 101 mmol/L (ref 96–106)
Creatinine, Ser: 0.82 mg/dL (ref 0.76–1.27)
Globulin, Total: 2.6 g/dL (ref 1.5–4.5)
Glucose: 129 mg/dL — ABNORMAL HIGH (ref 70–99)
Potassium: 4.6 mmol/L (ref 3.5–5.2)
Sodium: 139 mmol/L (ref 134–144)
Total Protein: 7.4 g/dL (ref 6.0–8.5)
eGFR: 99 mL/min/{1.73_m2} (ref >59)

## 2021-07-13 LAB — VITAMIN D 25 HYDROXY (VIT D DEFICIENCY, FRACTURES): Vit D, 25-Hydroxy: 35.1 ng/mL (ref 30.0–100.0)

## 2021-07-28 ENCOUNTER — Encounter: Payer: Self-pay | Admitting: Physician Assistant

## 2021-09-23 ENCOUNTER — Ambulatory Visit: Payer: BC Managed Care – PPO | Admitting: Endocrinology

## 2021-09-23 ENCOUNTER — Other Ambulatory Visit: Payer: Self-pay

## 2021-09-23 VITALS — BP 138/82 | HR 76 | Ht 67.0 in | Wt 182.0 lb

## 2021-09-23 DIAGNOSIS — E119 Type 2 diabetes mellitus without complications: Secondary | ICD-10-CM | POA: Diagnosis not present

## 2021-09-23 DIAGNOSIS — Z794 Long term (current) use of insulin: Secondary | ICD-10-CM | POA: Diagnosis not present

## 2021-09-23 LAB — POCT GLYCOSYLATED HEMOGLOBIN (HGB A1C): Hemoglobin A1C: 7.9 % — AB (ref 4.0–5.6)

## 2021-09-23 MED ORDER — METFORMIN HCL ER 500 MG PO TB24: 2000.0000 mg | ORAL_TABLET | Freq: Every day | ORAL | 3 refills | Status: DC

## 2021-09-23 MED ORDER — RYBELSUS 7 MG PO TABS: 7.0000 mg | ORAL_TABLET | Freq: Every day | ORAL | 3 refills | Status: DC

## 2021-09-23 MED ORDER — INSULIN NPH (HUMAN) (ISOPHANE) 100 UNIT/ML ~~LOC~~ SUSP: 50.0000 [IU] | SUBCUTANEOUS | 11 refills | Status: DC

## 2021-09-23 NOTE — Progress Notes (Signed)
Subjective:    Patient ID: Tyler Wolf, male    DOB: [birth year], 65 y.o.   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   Dx'ed: 4098 Complications: none Therapy: insulin since 2017, and 2 oral meds DKA: never Severe hypoglycemia: never Pancreatitis: never Pancreatic imaging: normal on 2007 CT SDOH: due to missing insulin, he is not a candidate for multiple daily injections.   Other: he changed Lantus to NPH, due to pattern of cbg's; he declines to add more non-insulin rx.  Interval history: pt says he takes insulin as rx'ed.  pt states he feels well in general.  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   HYPERLIPIDEMIA, MIXED 05/27/2007   HYPERTENSION 05/17/2007   TOBACCO ABUSE 05/27/2007    Past Surgical History:  Procedure Laterality Date   COLONOSCOPY      Social History   Socioeconomic History   Marital status: Married    Spouse name: Not on file   Number of children: Not on file   Years of education: Not on file   Highest education level: Not on file  Occupational History   Not on file  Tobacco Use   Smoking status: Every Day    Packs/day: 0.55    Years: 51.00    Pack years: 28.05    Types: Cigarettes   Smokeless tobacco: Never   Tobacco comments:    electronic cigarettes, 6/1/2020pt states on and off  Vaping Use   Vaping Use: Never used  Substance and Sexual Activity   Alcohol use: No    Alcohol/week: 0.0 standard drinks   Drug use: No   Sexual activity: Not Currently    Birth control/protection: None  Other Topics Concern   Not on file  Social History Narrative   Not on file   Social Determinants of Health   Financial Resource Strain: Not on file  Food Insecurity: Not on file   Transportation Needs: Not on file  Physical Activity: Not on file  Stress: Not on file  Social Connections: Not on file  Intimate Partner Violence: Not on file    Current Outpatient Medications on File Prior to Visit  Medication Sig Dispense Refill   aspirin 81 MG tablet Take 81 mg by mouth daily.     atenolol-chlorthalidone (TENORETIC) 50-25 MG tablet Take 1 tablet by mouth daily. 90 tablet 1   atorvastatin (LIPITOR) 80 MG tablet TAKE 1 TABLET BY MOUTH DAILY. 90 tablet 1   bacitracin 500 UNIT/GM ointment Apply 1 application topically 2 (two) times daily. 15 g 0   cholecalciferol (VITAMIN D3) 25 MCG (1000 UNIT) tablet Take 2,000 Units by mouth daily.     glucose blood (CONTOUR NEXT TEST) test strip 1 each by Other route 2 (two) times daily. And lancets 2/day 200 each 3   Insulin Syringe-Needle U-100 (INSULIN SYRINGE 1CC/30GX5/16") 30G X 5/16" 1 ML MISC 1 Syringe by Does not apply route daily. Use to inject insulin daily 100 each 0   lisinopril (ZESTRIL) 5 MG tablet TAKE 1 TABLET BY MOUTH DAILY. 90 tablet 0   tizanidine (ZANAFLEX) 2 MG capsule Take 1 capsule (2 mg total) by mouth 3 (three) times daily. 21 capsule 0   No current facility-administered medications on file  prior to visit.    Allergies  Allergen Reactions   Latex     REACTION: Rash    Family History  Problem Relation Age of Onset   Diabetes Mother    Heart attack Father    Diabetes Father    Colon polyps Neg Hx    Colon cancer Neg Hx    Esophageal cancer Neg Hx     BP 138/82 (BP Location: Right Arm, Patient Position: Sitting, Cuff Size: Normal)   Pulse 76   Ht 5\' 7"  (1.702 m)   Wt 182 lb (82.6 kg)   SpO2 98%   BMI 28.51 kg/m    Review of Systems Denies N/V/HB    Objective:   Physical Exam    Lab Results  Component Value Date   HGBA1C 7.9 (A) 09/23/2021      Assessment & Plan:  Insulin-requiring type 2 DM: uncontrolled.    Patient Instructions  I have sent a prescription to your pharmacy, to  increase the Rybelsus.  Please reduce the insulin to 50 units each morning.   Please continue the same metformin.   On this type of insulin schedule, you should eat meals on a regular schedule.  If a meal is missed or significantly delayed, your blood sugar could go low.   please come back for a follow-up appointment in 3 months.   check your blood sugar twice a day.  vary the time of day when you check, between before the 3 meals, and at bedtime.  also check if you have symptoms of your blood sugar being too high or too low.  please keep a record of the readings and bring it to your next appointment here (or you can bring the meter itself).  You can write it on any piece of paper.  please call us sooner if your blood sugar goes below 70, or if you have a lot of readings over 200.

## 2021-09-23 NOTE — Patient Instructions (Addendum)
I have sent a prescription to your pharmacy, to increase the Rybelsus.  Please reduce the insulin to 50 units each morning.   Please continue the same metformin.   On this type of insulin schedule, you should eat meals on a regular schedule.  If a meal is missed or significantly delayed, your blood sugar could go low.   please come back for a follow-up appointment in 3 months.   check your blood sugar twice a day.  vary the time of day when you check, between before the 3 meals, and at bedtime.  also check if you have symptoms of your blood sugar being too high or too low.  please keep a record of the readings and bring it to your next appointment here (or you can bring the meter itself).  You can write it on any piece of paper.  please call us sooner if your blood sugar goes below 70, or if you have a lot of readings over 200.

## 2021-11-10 ENCOUNTER — Encounter (INDEPENDENT_AMBULATORY_CARE_PROVIDER_SITE_OTHER): Payer: Self-pay | Admitting: Ophthalmology

## 2021-11-10 ENCOUNTER — Ambulatory Visit (INDEPENDENT_AMBULATORY_CARE_PROVIDER_SITE_OTHER): Payer: BC Managed Care – PPO | Admitting: Ophthalmology

## 2021-11-10 ENCOUNTER — Other Ambulatory Visit: Payer: Self-pay

## 2021-11-10 DIAGNOSIS — E1165 Type 2 diabetes mellitus with hyperglycemia: Secondary | ICD-10-CM | POA: Diagnosis not present

## 2021-11-10 DIAGNOSIS — H43823 Vitreomacular adhesion, bilateral: Secondary | ICD-10-CM

## 2021-11-10 NOTE — Progress Notes (Signed)
11/10/2021     CHIEF COMPLAINT Patient presents for  Chief Complaint  Patient presents with   Retina Follow Up      HISTORY OF PRESENT ILLNESS: Tyler Wolf is a 66 y.o. male who presents to the clinic today for:   HPI     Retina Follow Up           Diagnosis: Other   Laterality: both eyes   Onset: 1 year ago   Severity: mild   Duration: 1 year   Course: stable         Comments   1 year fu ou and oct and fp- [facility] OU  Pt states [facility] OU stable since last visit. Pt denies FOL, floaters, or ocular pain OU.  Last A1C: 8.1        Last edited by Kendra Opitz, COA on 11/10/2021  8:00 AM.      Referring physician: Lorrene Reid, PA-C [address],  [place] [id number]  HISTORICAL INFORMATION:   Selected notes from the MEDICAL RECORD NUMBER    Lab Results  Component Value Date   HGBA1C 7.9 (A) 09/23/2021     CURRENT MEDICATIONS: No current outpatient medications on file. (Ophthalmic Drugs)   No current facility-administered medications for this visit. (Ophthalmic Drugs)   Current Outpatient Medications (Other)  Medication Sig   aspirin 81 MG tablet Take 81 mg by mouth daily.   atenolol-chlorthalidone (TENORETIC) 50-25 MG tablet Take 1 tablet by mouth daily.   atorvastatin (LIPITOR) 80 MG tablet TAKE 1 TABLET BY MOUTH DAILY.   bacitracin 500 UNIT/GM ointment Apply 1 application topically 2 (two) times daily.   cholecalciferol (VITAMIN D3) 25 MCG (1000 UNIT) tablet Take 2,000 Units by mouth daily.   glucose blood (CONTOUR NEXT TEST) test strip 1 each by Other route 2 (two) times daily. And lancets 2/day   insulin NPH Human (HUMULIN N) 100 UNIT/ML injection Inject 0.5 mLs (50 Units total) into the skin every morning.   Insulin Syringe-Needle U-100 (INSULIN SYRINGE 1CC/30GX5/16") 30G X 5/16" 1 ML MISC 1 Syringe by Does not apply route daily. Use to inject insulin daily   lisinopril (ZESTRIL) 5 MG tablet TAKE 1 TABLET BY MOUTH DAILY.    metFORMIN (GLUCOPHAGE-XR) 500 MG 24 hr tablet Take 4 tablets (2,000 mg total) by mouth daily.   Semaglutide (RYBELSUS) 7 MG TABS Take 7 mg by mouth daily.   tizanidine (ZANAFLEX) 2 MG capsule Take 1 capsule (2 mg total) by mouth 3 (three) times daily.   No current facility-administered medications for this visit. (Other)      REVIEW OF SYSTEMS: ROS   Negative for: Constitutional, Gastrointestinal, Neurological, Skin, Genitourinary, Musculoskeletal, HENT, Endocrine, Cardiovascular, Eyes, Respiratory, Psychiatric, Allergic/Imm, Heme/Lymph Last edited by Hurman Horn, MD on 11/10/2021  8:32 AM.       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   HYPERLIPIDEMIA, MIXED 05/27/2007   HYPERTENSION 05/17/2007   TOBACCO ABUSE 05/27/2007   Past Surgical History:  Procedure Laterality Date   COLONOSCOPY      FAMILY HISTORY Family History  Problem Relation Age of Onset   Diabetes Mother    Heart attack Father    Diabetes Father    Colon  polyps Neg Hx    Colon cancer Neg Hx    Esophageal cancer Neg Hx     SOCIAL HISTORY Social History   Tobacco Use   Smoking status: Every Day    Packs/day: 0.55    Years: 51.00    Pack years: 28.05    Types: Cigarettes   Smokeless tobacco: Never   Tobacco comments:    electronic cigarettes, 6/1/2020pt states on and off  Vaping Use   Vaping Use: Never used  Substance Use Topics   Alcohol use: No    Alcohol/week: 0.0 standard drinks   Drug use: No         OPHTHALMIC EXAM:  Base Eye Exam     Visual Acuity (ETDRS)       Right Left   Dist cc 20/20 20/20 -1    Correction: Glasses         Tonometry (Tonopen, 8:03 AM)       Right Left   Pressure 17 17         Pupils       Pupils Dark Light Shape React APD    Right PERRL 3 2 Round Brisk None   Left PERRL 3 2 Round Brisk None         Visual Fields (Counting fingers)       Left Right    Full Full         Extraocular Movement       Right Left    Full, Ortho Full, Ortho         Neuro/Psych     Oriented x3: Yes         Dilation     Both eyes: 1.0% Mydriacyl, 2.5% Phenylephrine @ 8:03 AM           Slit Lamp and Fundus Exam     External Exam       Right Left   External Normal Normal         Slit Lamp Exam       Right Left   Lids/Lashes Normal Normal   Conjunctiva/Sclera White and quiet White and quiet   Cornea Clear Clear   Anterior Chamber Deep and quiet Deep and quiet   Iris Round and reactive Round and reactive   Lens 1+ Nuclear sclerosis 1+ Nuclear sclerosis   Anterior Vitreous Normal Normal         Fundus Exam       Right Left   Posterior Vitreous Normal Normal   Disc Normal Normal   C/D Ratio 0.35 0.35   Macula Normal Normal   Vessels no DR no DR   Periphery Normal Normal            IMAGING AND PROCEDURES  Imaging and Procedures for 11/10/21  OCT, Retina - OU - Both Eyes       Right Eye Quality was good. Central Foveal Thickness: 238. Progression has been stable. Findings include normal foveal contour, vitreomacular adhesion .   Left Eye Quality was good. Scan locations included subfoveal. Central Foveal Thickness: 241. Progression has been stable. Findings include normal foveal contour, vitreomacular adhesion .   Notes Normal macular and foveal topography OU, physiologic vitreomacular adhesion noted     Color Fundus Photography Optos - OU - Both Eyes       Right Eye Disc findings include normal observations. Macula : normal observations. Vessels : normal observations. Periphery : normal observations.   Left Eye Progression has no prior data. Disc  findings include normal observations. Macula : normal observations. Vessels : normal observations. Periphery : normal  observations.              ASSESSMENT/PLAN:  Poorly controlled diabetes mellitus ([place])- mgt per Endo; Dr Loanne Drilling Stable OU  Vitreomacular adhesion of both eyes Physiologic OU     ICD-10-CM   1. Vitreomacular adhesion of both eyes  H43.823 OCT, Retina - OU - Both Eyes    Color Fundus Photography Optos - OU - Both Eyes    2. Poorly controlled diabetes mellitus ([place])- mgt per Endo; Dr Loanne Drilling  E11.65       1.  2.  3.  Ophthalmic Meds Ordered this visit:  No orders of the defined types were placed in this encounter.      Return in about 1 year (around 11/10/2022) for DILATE OU, COLOR FP, OCT.  There are no Patient Instructions on file for this visit.   Explained the diagnoses, plan, and follow up with the patient and they expressed understanding.  Patient expressed understanding of the importance of proper follow up care.   Clent Demark [place] M.D. Diseases & Surgery of the Retina and Vitreous [facility]ina [facility] Diabetic [place] 11/10/21     Abbreviations: M myopia (nearsighted); A astigmatism; H hyperopia (farsighted); P presbyopia; Mrx spectacle prescription;  CTL contact lenses; OD right eye; OS left eye; OU both eyes  XT exotropia; ET esotropia; PEK punctate epithelial keratitis; PEE punctate epithelial erosions; DES dry eye syndrome; MGD meibomian gland dysfunction; ATs artificial tears; PFAT's preservative free artificial tears; [place] nuclear sclerotic cataract; PSC posterior subcapsular cataract; ERM epi-retinal membrane; PVD posterior vitreous detachment; RD retinal detachment; DM diabetes mellitus; DR diabetic retinopathy; NPDR non-proliferative diabetic retinopathy; PDR proliferative diabetic retinopathy; CSME clinically significant macular edema; DME diabetic macular edema; dbh dot blot hemorrhages; CWS cotton wool spot; POAG primary open angle glaucoma; C/D cup-to-disc ratio; HVF humphrey visual field; GVF goldmann visual field; OCT optical coherence tomography; IOP  intraocular pressure; BRVO Branch retinal vein occlusion; CRVO central retinal vein occlusion; CRAO central retinal artery occlusion; BRAO branch retinal artery occlusion; RT retinal tear; SB scleral buckle; PPV pars plana vitrectomy; VH Vitreous hemorrhage; PRP panretinal laser photocoagulation; IVK intravitreal kenalog; VMT vitreomacular traction; MH Macular hole;  NVD neovascularization of the disc; NVE neovascularization elsewhere; AREDS age related eye disease study; ARMD age related macular degeneration; POAG primary open angle glaucoma; EBMD epithelial/anterior basement membrane dystrophy; ACIOL anterior chamber intraocular lens; IOL intraocular lens; PCIOL posterior chamber intraocular lens; Phaco/IOL phacoemulsification with intraocular lens placement; [place] photorefractive keratectomy; LASIK laser assisted in situ keratomileusis; HTN hypertension; DM diabetes mellitus; COPD chronic obstructive pulmonary disease

## 2021-11-10 NOTE — Assessment & Plan Note (Signed)
Physiologic OU 

## 2021-11-10 NOTE — Assessment & Plan Note (Signed)
Stable OU 

## 2021-12-23 ENCOUNTER — Ambulatory Visit: Payer: BC Managed Care – PPO | Admitting: Endocrinology

## 2021-12-23 ENCOUNTER — Other Ambulatory Visit: Payer: Self-pay

## 2021-12-23 VITALS — BP 160/80 | HR 71 | Ht 67.0 in | Wt 175.4 lb

## 2021-12-23 DIAGNOSIS — E119 Type 2 diabetes mellitus without complications: Secondary | ICD-10-CM

## 2021-12-23 DIAGNOSIS — Z794 Long term (current) use of insulin: Secondary | ICD-10-CM | POA: Diagnosis not present

## 2021-12-23 LAB — POCT GLYCOSYLATED HEMOGLOBIN (HGB A1C): Hemoglobin A1C: 8.3 % — AB (ref 4.0–5.6)

## 2021-12-23 NOTE — Progress Notes (Signed)
? ?Subjective:  ? ? Patient ID: Tyler Wolf, male    DOB: [birth year], 66 y.o.   MRN: [id number] ? 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   ?Dx'ed: 2009 ?Complications: none ?Therapy: insulin since 2017, and 2 oral meds ?DKA: never ?Severe hypoglycemia: never ?Pancreatitis: never ?Pancreatic imaging: normal on 2007 CT ?SDOH: due to missing insulin, he is not a candidate for multiple daily injections.   ?Other: he changed Lantus to NPH, due to pattern of cbg's; he declines to add more non-insulin rx.  ?Interval history: pt states he feels well in general.  He says cbg's vary from 157-230.  He sometimes misses meds.   ?Past Medical History:  ?Diagnosis Date  ? Allergy   ? DIABETES MELLITUS, TYPE II 05/17/2007  ? DIVERTICULITIS, ACUTE 08/17/2010  ? 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 HYPERLIPIDEMIA, MIXED 05/27/2007  ? HYPERTENSION 05/17/2007  ? TOBACCO ABUSE 05/27/2007  ? ? ?Past Surgical History:  ?Procedure Laterality Date  ? COLONOSCOPY    ? ? ?Social History  ? ?Socioeconomic History  ? Marital status: Married  ?  Spouse name: Not on file  ? Number of children: Not on file  ? Years of education: Not on file  ? Highest education level: Not on file  ?Occupational History  ? Not on file  ?Tobacco Use  ? Smoking status: Every Day  ?  Packs/day: 0.55  ?  Years: 51.00  ?  Pack years: 28.05  ?  Types: Cigarettes  ? Smokeless tobacco: Never  ? Tobacco comments:  ?  electronic cigarettes, 6/1/2020pt states on and off  ?Vaping Use  ? Vaping Use: Never used  ?Substance and Sexual Activity  ? Alcohol use: No  ?  Alcohol/week: 0.0 standard drinks  ? Drug use: No  ? Sexual activity: Not Currently  ?  Birth control/protection: None  ?Other Topics Concern  ? Not on file  ?Social History Narrative  ? Not on file  ? ?Social Determinants of Health  ? ?Financial Resource Strain: Not on file  ?Food Insecurity: Not on file   ?Transportation Needs: Not on file  ?Physical Activity: Not on file  ?Stress: Not on file  ?Social Connections: Not on file  ?Intimate Partner Violence: Not on file  ? ? ?Current Outpatient Medications on File Prior to Visit  ?Medication Sig Dispense Refill  ? aspirin 81 MG tablet Take 81 mg by mouth daily.    ? atenolol-chlorthalidone (TENORETIC) 50-25 MG tablet Take 1 tablet by mouth daily. 90 tablet 1  ? atorvastatin (LIPITOR) 80 MG tablet TAKE 1 TABLET BY MOUTH DAILY. 90 tablet 1  ? bacitracin 500 UNIT/GM ointment Apply 1 application topically 2 (two) times daily. 15 g 0  ? cholecalciferol (VITAMIN D3) 25 MCG (1000 UNIT) tablet Take 2,000 Units by mouth daily.    ? glucose blood (CONTOUR NEXT TEST) test strip 1 each by Other route 2 (two) times daily. And lancets 2/day 200 each 3  ? insulin NPH Human (HUMULIN N) 100 UNIT/ML injection Inject 0.5 mLs (50 Units total) into the skin every morning. 20 mL 11  ? Insulin Syringe-Needle U-100 (INSULIN SYRINGE 1CC/30GX5/16") 30G X 5/16" 1 ML MISC 1 Syringe by Does not apply route daily. Use to inject insulin daily 100 each 0  ? lisinopril (ZESTRIL) 5 MG tablet TAKE 1 TABLET BY MOUTH DAILY. 90 tablet 0  ? metFORMIN (GLUCOPHAGE-XR) 500 MG  24 hr tablet Take 4 tablets (2,000 mg total) by mouth daily. 360 tablet 3  ? Semaglutide (RYBELSUS) 7 MG TABS Take 7 mg by mouth daily. 90 tablet 3  ? tizanidine (ZANAFLEX) 2 MG capsule Take 1 capsule (2 mg total) by mouth 3 (three) times daily. 21 capsule 0  ? ?No current facility-administered medications on file prior to visit.  ? ? ?Allergies  ?Allergen Reactions  ? Latex   ?  REACTION: Rash  ? ? ?Family History  ?Problem Relation Age of Onset  ? Diabetes Mother   ? Heart attack Father   ? Diabetes Father   ? Colon polyps Neg Hx   ? Colon cancer Neg Hx   ? Esophageal cancer Neg Hx   ? ? ?BP (!) 160/80   Pulse 71   Ht '5\' 7"'$  (1.702 m)   Wt 175 lb 6.4 oz (79.6 kg)   SpO2 99%   BMI 27.47 kg/m?  ? ? ?Review of Systems ?Denies N/V/HB ?    ?Objective:  ? Physical Exam ?VITAL SIGNS:  See vs page ?GENERAL: no distress ? ? ? ?Lab Results  ?Component Value Date  ? HGBA1C 8.3 (A) 12/23/2021  ? ?   ?Assessment & Plan:  ?Insulin-requiring type 2 DM: uncontrolled.  We discussed.  Pt declines to increase rx.  He says he will work on taking meds as rx'ed.   ? ?Patient Instructions  ?Please continue the same 2 diabetes medications ?On this type of insulin schedule, you should eat meals on a regular schedule.  If a meal is missed or significantly delayed, your blood sugar could go low.   ?please come back for a follow-up appointment in 3 months.   ?check your blood sugar twice a day.  vary the time of day when you check, between before the 3 meals, and at bedtime.  also check if you have symptoms of your blood sugar being too high or too low.  please keep a record of the readings and bring it to your next appointment here (or you can bring the meter itself).  You can write it on any piece of paper.  please call us sooner if your blood sugar goes below 70, or if you have a lot of readings over 200.   ? ? ?

## 2021-12-23 NOTE — Patient Instructions (Addendum)
Please continue the same 2 diabetes medications ?On this type of insulin schedule, you should eat meals on a regular schedule.  If a meal is missed or significantly delayed, your blood sugar could go low.   ?please come back for a follow-up appointment in 3 months.   ?check your blood sugar twice a day.  vary the time of day when you check, between before the 3 meals, and at bedtime.  also check if you have symptoms of your blood sugar being too high or too low.  please keep a record of the readings and bring it to your next appointment here (or you can bring the meter itself).  You can write it on any piece of paper.  please call us sooner if your blood sugar goes below 70, or if you have a lot of readings over 200.   ?

## 2022-02-21 ENCOUNTER — Ambulatory Visit: Payer: BC Managed Care – PPO | Admitting: Endocrinology

## 2022-03-21 NOTE — Progress Notes (Unsigned)
Patient ID: Tyler Wolf, male   DOB: 1956/09/25, 66 y.o.   MRN: [id number]         DM type: Insulin-requiring type 2 (but lean body habitus suggests he is developing type 1).   Dx'ed: 7867 Complications: none Therapy: insulin since 2017, and 2 oral meds DKA: never Severe hypoglycemia: never Pancreatitis: never Pancreatic imaging: normal on 2007 CT SDOH: due to missing insulin, he is not a candidate for multiple daily injections.   Other: he changed Lantus to NPH, due to pattern of cbg's; he declines to add more non-insulin rx.  Interval history: pt states he feels well in general.  He says cbg's vary from 157-230.  He sometimes misses meds.   Reason for Appointment: Type II Diabetes follow-up   History of Present Illness   Diagnosis date:   Previous history:  Recent history:     Non-insulin hypoglycemic drugs:      Insulin regimen:            Side effects from medications: None  Current self management, blood sugar patterns and problems identified:  A1c is  Exercise: Diet management:      Monitors blood glucose: Once a day.    Glucometer: One Touch.           Blood Glucose readings from meter download:   PRE-MEAL Fasting Lunch Dinner Bedtime Overall  Glucose range:       Mean/median:        POST-MEAL PC Breakfast PC Lunch PC Dinner  Glucose range:     Mean/median:        Hypoglycemia:  none                        Dietician visit: Most recent:      Weight control:  Wt Readings from Last 3 Encounters:  12/23/21 175 lb 6.4 oz (79.6 kg)  09/23/21 182 lb (82.6 kg)  07/12/21 184 lb 6.4 oz (83.6 kg)            Diabetes labs:  Lab Results  Component Value Date   HGBA1C 8.3 (A) 12/23/2021   HGBA1C 7.9 (A) 09/23/2021   HGBA1C 8.0 (A) 06/22/2021   Lab Results  Component Value Date   MICROALBUR 30 11/08/2020   LDLCALC 133 (H) 07/12/2021   CREATININE 0.82 07/12/2021     Allergies as of 03/22/2022       Reactions   Latex    REACTION: Rash         Medication List        Accurate as of March 21, 2022  9:29 PM. If you have any questions, ask your nurse or doctor.          aspirin 81 MG tablet Take 81 mg by mouth daily.   atenolol-chlorthalidone 50-25 MG tablet Commonly known as: TENORETIC Take 1 tablet by mouth daily.   atorvastatin 80 MG tablet Commonly known as: LIPITOR TAKE 1 TABLET BY MOUTH DAILY.   bacitracin 500 UNIT/GM ointment Apply 1 application topically 2 (two) times daily.   cholecalciferol 25 MCG (1000 UNIT) tablet Commonly known as: VITAMIN D3 Take 2,000 Units by mouth daily.   glucose blood test strip Commonly known as: Contour Next Test 1 each by Other route 2 (two) times daily. And lancets 2/day   insulin NPH Human 100 UNIT/ML injection Commonly known as: HumuLIN N Inject 0.5 mLs (50 Units total) into the skin every morning.   INSULIN SYRINGE 1CC/30GX5/16" 30G  X 5/16" 1 ML Misc 1 Syringe by Does not apply route daily. Use to inject insulin daily   lisinopril 5 MG tablet Commonly known as: ZESTRIL TAKE 1 TABLET BY MOUTH DAILY.   metFORMIN 500 MG 24 hr tablet Commonly known as: GLUCOPHAGE-XR Take 4 tablets (2,000 mg total) by mouth daily.   Rybelsus 7 MG Tabs Generic drug: Semaglutide Take 7 mg by mouth daily.   tizanidine 2 MG capsule Commonly known as: Zanaflex Take 1 capsule (2 mg total) by mouth 3 (three) times daily.        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 He has a 28.05 pack-year smoking history. He has never used smokeless tobacco. He reports that he does not drink alcohol and does not use drugs.  Review of Systems:  Last diabetic eye exam date  Last foot exam date:   Hypertension:    BP Readings from Last 3 Encounters:  12/23/21 (!) 160/80  09/23/21 138/82  07/12/21 122/80    Lipids:     Lab Results  Component Value Date   CHOL 199 07/12/2021   CHOL 137 03/04/2021   CHOL 130 06/18/2019   Lab Results  Component Value Date   HDL 39 (L) 07/12/2021   HDL 41 03/04/2021   HDL 36 (L) 06/18/2019   Lab Results  Component Value Date   LDLCALC 133 (H) 07/12/2021   LDLCALC 75 03/04/2021   LDLCALC 73 06/18/2019   Lab Results  Component Value Date   TRIG 151 (H) 07/12/2021   TRIG 115 03/04/2021   TRIG 117 06/18/2019   Lab Results  Component Value Date   CHOLHDL 5.1 (H) 07/12/2021   CHOLHDL 3.3 03/04/2021   CHOLHDL 3.6 06/18/2019   Lab Results  Component Value Date   LDLDIRECT 73 06/18/2019   LDLDIRECT 160.7 11/27/2007   LDLDIRECT 117.9 05/20/2007     Examination:   There were no vitals taken for this visit.  There is no height or weight on file to calculate BMI.    ASSESSMENT/ PLAN:    Diabetes type 2:   Current regimen:  Blood glucose control  There are no Patient Instructions on file for this visit.   Elayne Snare 03/21/2022, 9:29 PM

## 2022-03-22 ENCOUNTER — Encounter: Payer: Self-pay | Admitting: Endocrinology

## 2022-03-22 ENCOUNTER — Telehealth: Payer: Self-pay

## 2022-03-22 ENCOUNTER — Ambulatory Visit (INDEPENDENT_AMBULATORY_CARE_PROVIDER_SITE_OTHER): Payer: BC Managed Care – PPO | Admitting: Endocrinology

## 2022-03-22 VITALS — BP 142/76 | HR 76 | Ht 67.0 in | Wt 170.2 lb

## 2022-03-22 DIAGNOSIS — Z794 Long term (current) use of insulin: Secondary | ICD-10-CM

## 2022-03-22 DIAGNOSIS — E1159 Type 2 diabetes mellitus with other circulatory complications: Secondary | ICD-10-CM | POA: Diagnosis not present

## 2022-03-22 DIAGNOSIS — E1169 Type 2 diabetes mellitus with other specified complication: Secondary | ICD-10-CM

## 2022-03-22 DIAGNOSIS — E782 Mixed hyperlipidemia: Secondary | ICD-10-CM

## 2022-03-22 DIAGNOSIS — E119 Type 2 diabetes mellitus without complications: Secondary | ICD-10-CM | POA: Diagnosis not present

## 2022-03-22 DIAGNOSIS — E1165 Type 2 diabetes mellitus with hyperglycemia: Secondary | ICD-10-CM

## 2022-03-22 DIAGNOSIS — I10 Essential (primary) hypertension: Secondary | ICD-10-CM

## 2022-03-22 LAB — POCT GLYCOSYLATED HEMOGLOBIN (HGB A1C): Hemoglobin A1C: 11.8 % — AB (ref 4.0–5.6)

## 2022-03-22 LAB — POCT GLUCOSE (DEVICE FOR HOME USE): POC Glucose: 229 mg/dl — AB (ref 70–99)

## 2022-03-22 MED ORDER — LOSARTAN POTASSIUM 50 MG PO TABS: 50.0000 mg | ORAL_TABLET | Freq: Every day | ORAL | 2 refills | Status: DC

## 2022-03-22 MED ORDER — ATORVASTATIN CALCIUM 40 MG PO TABS: 40.0000 mg | ORAL_TABLET | Freq: Every day | ORAL | 3 refills | Status: DC

## 2022-03-22 MED ORDER — INSULIN ISOPHANE & REGULAR (HUMAN 70-30)100 UNIT/ML KWIKPEN: PEN_INJECTOR | SUBCUTANEOUS | 1 refills | Status: DC

## 2022-03-22 MED ORDER — CONTOUR NEXT TEST VI STRP: 1.0000 | ORAL_STRIP | Freq: Two times a day (BID) | 3 refills | Status: AC

## 2022-03-22 MED ORDER — INSULIN PEN NEEDLE 31G X 5 MM MISC: 1 refills | Status: AC

## 2022-03-22 NOTE — Telephone Encounter (Signed)
Patient's insurance will cover Novolin but wont cover Humulin 70/30 is it okay to change to the preferred alternative?

## 2022-03-22 NOTE — Patient Instructions (Addendum)
70/30 insulin 35 U at Bfst and 15 at supper  Check blood sugars on waking up 3-4 days a week  Also check blood sugars about 2 hours after meals and do this after different meals by rotation  Recommended blood sugar levels on waking up are 90-130 and about 2 hours after meal is 130-180  Please bring your blood sugar monitor to each visit, thank you  Refill Metformin

## 2022-03-23 NOTE — Telephone Encounter (Signed)
Pharmacy has now been informed that Novolin is fine to replace as the preferred alternative.

## 2022-06-12 ENCOUNTER — Other Ambulatory Visit: Payer: BC Managed Care – PPO

## 2022-06-15 ENCOUNTER — Ambulatory Visit: Payer: BC Managed Care – PPO | Admitting: Endocrinology

## 2022-06-15 ENCOUNTER — Encounter: Payer: Self-pay | Admitting: Endocrinology

## 2022-06-15 VITALS — BP 144/68 | HR 81 | Ht 67.0 in | Wt 171.6 lb

## 2022-06-15 DIAGNOSIS — I10 Essential (primary) hypertension: Secondary | ICD-10-CM | POA: Diagnosis not present

## 2022-06-15 DIAGNOSIS — E1169 Type 2 diabetes mellitus with other specified complication: Secondary | ICD-10-CM | POA: Diagnosis not present

## 2022-06-15 DIAGNOSIS — E1165 Type 2 diabetes mellitus with hyperglycemia: Secondary | ICD-10-CM

## 2022-06-15 DIAGNOSIS — Z794 Long term (current) use of insulin: Secondary | ICD-10-CM | POA: Diagnosis not present

## 2022-06-15 DIAGNOSIS — E782 Mixed hyperlipidemia: Secondary | ICD-10-CM

## 2022-06-15 MED ORDER — FREESTYLE LIBRE 3 SENSOR MISC: 1.0000 | 2 refills | Status: DC

## 2022-06-15 MED ORDER — ATORVASTATIN CALCIUM 40 MG PO TABS: 40.0000 mg | ORAL_TABLET | Freq: Every day | ORAL | 3 refills | Status: DC

## 2022-06-15 NOTE — Patient Instructions (Addendum)
Check blood sugars on waking up 5  days a week  Also check blood sugars about 2 hours after meals and do this after different meals by rotation  Recommended blood sugar levels on waking up are 90-130 and about 2 hours after meal is 130-180  Please bring your blood sugar monitor to each visit, thank you  Restart Losartan

## 2022-06-15 NOTE — Progress Notes (Signed)
Patient ID: Tyler Wolf, male   DOB: 30-Mar-1956, 66 y.o.   MRN: [id number]           Reason for Appointment: Type II Diabetes follow-up   History of Present Illness   Diagnosis date: 2009  Previous history:  Historically his A1c has ranged between 7.6 and 10.5 His treatment in the past has included glipizide, metformin, Rybelsus premixed insulin, Lantus and NPH Rybelsus: Initiated in 9/22  Recent history:     Non-insulin hypoglycemic drugs: , metformin 2 g daily     Insulin regimen: 35 units 70/30 in the morning, 15 at dinner          Side effects from medications: None  Current self management, blood sugar patterns and problems identified:  A1c is 11.8 previously and the highest he has had He was told to restart his insulin and medications on the last visit Also NPH was changed to 70/30 However he has not checked his blood sugars are taking his insulin regularly He thinks he is taking insulin as directed consistently 3 days in the last week  His blood sugar was 401 today in the office in the morning He has again avoided drinking regular sweetened drinks  Also no hypoglycemia when he takes insulin in the evening He is fairly active with construction activities Usually eating 3 meals a day Weight is the same  Exercise: At work with remodeling houses  Diet management: Does not have a meal plan      Glucometer: Contour next   Hypoglycemia:  none recently              Weight control:  Wt Readings from Last 3 Encounters:  06/15/22 171 lb 9.6 oz (77.8 kg)  03/22/22 170 lb 3.2 oz (77.2 kg)  12/23/21 175 lb 6.4 oz (79.6 kg)            Diabetes labs:  Lab Results  Component Value Date   HGBA1C 11.8 (A) 03/22/2022   HGBA1C 8.3 (A) 12/23/2021   HGBA1C 7.9 (A) 09/23/2021   Lab Results  Component Value Date   MICROALBUR 30 11/08/2020   LDLCALC 133 (H) 07/12/2021   CREATININE 0.82 07/12/2021     Allergies as of 06/15/2022       Reactions   Latex     REACTION: Rash        Medication List        Accurate as of June 15, 2022  9:02 AM. If you have any questions, ask your nurse or doctor.          aspirin 81 MG tablet Take 81 mg by mouth daily.   atenolol-chlorthalidone 50-25 MG tablet Commonly known as: TENORETIC Take 1 tablet by mouth daily.   atorvastatin 40 MG tablet Commonly known as: LIPITOR Take 1 tablet (40 mg total) by mouth daily.   bacitracin 500 UNIT/GM ointment Apply 1 application topically 2 (two) times daily.   cholecalciferol 25 MCG (1000 UNIT) tablet Commonly known as: VITAMIN D3 Take 2,000 Units by mouth daily.   Contour Next Test test strip Generic drug: glucose blood 1 each by Other route 2 (two) times daily. And lancets 2/day   insulin isophane & regular human KwikPen (70-30) 100 UNIT/ML KwikPen Commonly known as: HUMULIN 70/30 MIX 35 units before breakfast and 15 before dinner   Insulin Pen Needle 31G X 5 MM Misc Used to inject insulin   INSULIN SYRINGE 1CC/30GX5/16" 30G X 5/16" 1 ML Misc 1 Syringe by Does not  apply route daily. Use to inject insulin daily   losartan 50 MG tablet Commonly known as: COZAAR Take 1 tablet (50 mg total) by mouth daily.   metFORMIN 500 MG 24 hr tablet Commonly known as: GLUCOPHAGE-XR Take 4 tablets (2,000 mg total) by mouth daily.   Rybelsus 7 MG Tabs Generic drug: Semaglutide Take 7 mg by mouth daily.   tizanidine 2 MG capsule Commonly known as: Zanaflex Take 1 capsule (2 mg total) by mouth 3 (three) times daily.        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 He has a 28.05 pack-year smoking history. He has never used smokeless tobacco. He reports that he does not drink alcohol and does not use drugs.  Review of Systems:  Last diabetic eye exam date 1/23 with Dr. Zadie Rhine, reportedly no retinopathy  Last foot exam date:9/22   Hypertension: He was started on losartan but he has not taken this recently  BP Readings from Last 3 Encounters:  06/15/22 (!) 144/68  03/22/22 (!) 142/76  12/23/21 (!) 160/80    Lipids: Previously on atorvastatin, did not come for his lab appointment    Lab Results  Component Value Date   CHOL 199 07/12/2021   CHOL 137 03/04/2021   CHOL 130 06/18/2019   Lab Results  Component Value Date   HDL 39 (L) 07/12/2021   HDL 41 03/04/2021   HDL 36 (L) 06/18/2019   Lab Results  Component Value Date   LDLCALC 133 (H) 07/12/2021   LDLCALC 75 03/04/2021   LDLCALC 73 06/18/2019   Lab Results  Component Value Date   TRIG 151 (H) 07/12/2021   TRIG 115 03/04/2021   TRIG 117 06/18/2019   Lab Results  Component Value Date   CHOLHDL 5.1 (H) 07/12/2021   CHOLHDL 3.3 03/04/2021   CHOLHDL 3.6 06/18/2019   Lab Results  Component Value Date   LDLDIRECT 73 06/18/2019   LDLDIRECT 160.7 11/27/2007   LDLDIRECT 117.9 05/20/2007     Examination:   BP (!) 144/68   Pulse 81   Ht '5\' 7"'$  (1.702 m)   Wt 171 lb 9.6 oz (77.8 kg)   SpO2 98%   BMI 26.88 kg/m   Body mass index is 26.88 kg/m.    ASSESSMENT/ PLAN:    Diabetes type 2 insulin-dependent:   Current regimen: 70/30 insulin twice a day, metformin  A1c is last 11.8  Blood glucose control has been consistently poor and is not improved He has taken insulin only sporadically Without home glucose monitoring difficult to know  what his insulin requirement is and this was discussed  Recommendations:  Start rechecking blood sugars on waking up 3-4 days a week  Also check blood sugars about 2 hours  after meals and do this after different meals by rotation Recommended blood sugar levels on waking up are 90-130 and about 2 hours after meal is 130-180  Also we will look into the coverage for the freestyle libre version 3 and discussed how this would be useful in monitoring his blood sugars, adjusting his diet and insulin and alerting high and low sugars He will let us know if he needs any assistance starting this but likely can do this when he sees the diabetes educator  Also introduced him to the idea of doing the V-go pump for basal bolus insulin regimen which would be more practical for him with his busy schedule and also allow more efficient insulin administration including mealtime boluses We will check on insurance coverage for this and patient information brochure given  For now we will continue his 70/30 insulin 35 U at Bfst and 15 at supper This will be further adjusted based on his blood sugar patterns when he comes back  Continue metformin  Consultation with diabetes educator for day-to-day management, starting sensor and reviewing insulin regimen    HYPERTENSION: He will start back on 50 mg losartan for multiple benefits  LIPIDS: Since he tolerated Lipitor before he will restart this at 40 mg daily, follow-up labs in the next visit  Still needs to follow-up with PCP for general preventive care  There are no Patient Instructions on file for this visit.   Elayne Snare 06/15/2022, 9:02 AM

## 2022-06-16 ENCOUNTER — Other Ambulatory Visit (HOSPITAL_COMMUNITY): Payer: Self-pay

## 2022-06-16 ENCOUNTER — Telehealth: Payer: Self-pay

## 2022-06-16 NOTE — Telephone Encounter (Signed)
Patient Advocate Encounter   Received notification from [facility] that prior authorization is required for [employer]. Key [id number]  This patients insurance prefers Dexcom products. Contacting office to determine if appropriate for change. PA not submitted at this time.  Clista Bernhardt, CPhT Rx Patient Advocate Phone: [phone]

## 2022-06-23 MED ORDER — DEXCOM G7 SENSOR MISC: 1 refills | Status: DC

## 2022-06-23 NOTE — Telephone Encounter (Signed)
Patient scheduled for 10/10

## 2022-06-23 NOTE — Addendum Note (Signed)
Addended by: Cinda Quest on: 06/23/2022 08:47 AM   Modules accepted: Orders

## 2022-07-21 ENCOUNTER — Other Ambulatory Visit: Payer: BC Managed Care – PPO

## 2022-07-24 ENCOUNTER — Ambulatory Visit (INDEPENDENT_AMBULATORY_CARE_PROVIDER_SITE_OTHER): Payer: BC Managed Care – PPO

## 2022-07-24 ENCOUNTER — Other Ambulatory Visit: Payer: BC Managed Care – PPO

## 2022-07-24 ENCOUNTER — Ambulatory Visit
Admission: EM | Admit: 2022-07-24 | Discharge: 2022-07-24 | Disposition: A | Discharge status: Home / Self Care | Payer: BC Managed Care – PPO | Attending: Internal Medicine | Admitting: Internal Medicine

## 2022-07-24 DIAGNOSIS — W19XXXA Unspecified fall, initial encounter: Secondary | ICD-10-CM | POA: Diagnosis not present

## 2022-07-24 DIAGNOSIS — M19012 Primary osteoarthritis, left shoulder: Secondary | ICD-10-CM | POA: Diagnosis not present

## 2022-07-24 DIAGNOSIS — M25512 Pain in left shoulder: Secondary | ICD-10-CM

## 2022-07-24 MED ORDER — PREDNISONE 20 MG PO TABS: 40.0000 mg | ORAL_TABLET | Freq: Every day | ORAL | 0 refills | Status: AC

## 2022-07-24 MED ORDER — CYCLOBENZAPRINE HCL 10 MG PO TABS: 10.0000 mg | ORAL_TABLET | Freq: Two times a day (BID) | ORAL | 0 refills | Status: AC | PRN

## 2022-07-24 NOTE — ED Triage Notes (Signed)
Pt c/o remodeling a bathroom on Friday when he fell and injured his left shoulder.

## 2022-07-24 NOTE — ED Provider Notes (Signed)
[facility]    CSN: [id number] Arrival date & time: 07/24/22  0830      History   Chief Complaint Chief Complaint  Patient presents with   left shoulder injury    HPI Tyler Wolf is a 66 y.o. male.   Patient here today for evaluation of left shoulder injury that occurred 3 days ago.  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  In doing so he pulled something in his left shoulder and has continued to have pain with movement since that time.  He has tried over-the-counter medication without significant relief.  He denies any numbness or tingling.  The history is provided by the patient.    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   Age-related nuclear cataract of both eyes 11/09/2020   Hypertension associated with type 2 diabetes mellitus ([place]) 11/08/2020   Vitamin D insufficiency 02/12/2020   Poorly controlled diabetes mellitus ([place])- mgt per Endo; Dr Loanne Drilling 12/26/2019   Mixed diabetic hyperlipidemia associated with type 2 diabetes mellitus ([place]) 12/26/2019   Healthcare maintenance 08/27/2018   Popliteal cyst, left 07/03/2017   Diabetes ([place]) 06/04/2014   Hyperlipidemia, mixed 05/27/2007   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 Authorizing Provider   cyclobenzaprine (FLEXERIL) 10 MG tablet Take 1 tablet (10 mg total) by mouth 2 (two) times daily as needed for muscle spasms. 07/24/22  Yes Francene Finders, PA-C  predniSONE (DELTASONE) 20 MG tablet Take 2 tablets (40 mg total) by mouth daily with breakfast for 5 days. 07/24/22 07/29/22 Yes Francene Finders, PA-C  aspirin 81 MG tablet Take 81 mg by mouth daily.    [provider]  atorvastatin (LIPITOR) 40 MG tablet Take 1 tablet (40 mg total) by mouth daily. 06/15/22   Elayne Snare, MD  bacitracin 500 UNIT/GM ointment Apply 1 application topically 2 (two) times daily. 06/08/21   Lorrene Reid, PA-C  cholecalciferol (VITAMIN D3) 25 MCG (1000 UNIT) tablet Take 2,000 Units by mouth daily.    [provider]  Continuous Blood Gluc Sensor (DEXCOM G7 SENSOR) MISC Change every 10 days 06/23/22   Elayne Snare, MD  Continuous Blood Gluc Sensor (FREESTYLE LIBRE 3 SENSOR) MISC 1 Device by Does not apply route every 14 (fourteen) days. Apply 1 sensor on upper arm every 14 days for continuous glucose monitoring 06/15/22   Elayne Snare, MD  glucose blood (CONTOUR NEXT TEST) test strip 1 each by Other route 2 (two) times daily. And lancets 2/day 03/22/22   Elayne Snare, MD  insulin isophane & regular human KwikPen (HUMULIN 70/30 MIX) (70-30) 100 UNIT/ML KwikPen 35 units before breakfast and 15 before dinner 03/22/22   Elayne Snare, MD  Insulin Pen Needle 31G X 5 MM MISC Used to  inject insulin 03/22/22   Elayne Snare, MD  Insulin Syringe-Needle U-100 (INSULIN SYRINGE 1CC/30GX5/16") 30G X 5/16" 1 ML MISC 1 Syringe by Does not apply route daily. Use to inject insulin daily 04/03/19   Renato Shin, MD  losartan (COZAAR) 50 MG tablet Take 1 tablet (50 mg total) by mouth daily. 03/22/22   Elayne Snare, MD  metFORMIN (GLUCOPHAGE-XR) 500 MG 24 hr tablet Take 4 tablets (2,000 mg total) by mouth daily. 09/23/21   Renato Shin, MD  tizanidine (ZANAFLEX) 2 MG capsule Take 1 capsule (2 mg total) by mouth 3 (three) times  daily. Patient not taking: Reported on 06/15/2022 04/01/21   Hazel Sams, PA-C    Family History Family History  Problem Relation Age of Onset   Diabetes Mother    Heart attack Father    Diabetes Father    Colon polyps Neg Hx    Colon cancer Neg Hx    Esophageal cancer Neg Hx     Social History Social History   Tobacco Use   Smoking status: Every Day    Packs/day: 0.55    Years: 51.00    Total pack years: 28.05    Types: Cigarettes   Smokeless tobacco: Never   Tobacco comments:    electronic cigarettes, 6/1/2020pt states on and off  Vaping Use   Vaping Use: Never used  Substance Use Topics   Alcohol use: No    Alcohol/week: 0.0 standard drinks of alcohol   Drug use: No     Allergies   Latex   Review of Systems Review of Systems  Constitutional:  Negative for chills and fever.  Eyes:  Negative for discharge and redness.  Respiratory:  Negative for shortness of breath.   Musculoskeletal:  Positive for arthralgias and myalgias.  Neurological:  Negative for numbness.     Physical Exam Triage Vital Signs ED Triage Vitals [07/24/22 0906]  Enc Vitals Group     BP (!) 159/96     Pulse Rate 89     Resp 16     Temp 98 F (36.7 C)     Temp Source Oral     SpO2 98 %     Weight      Height      Head Circumference      Peak Flow      Pain Score 1     Pain Loc      Pain Edu?      Excl. in [place]?    No data found.  Updated Vital Signs BP (!) 159/96 (BP Location: Left Arm)   Pulse 89   Temp 98 F (36.7 C) (Oral)   Resp 16   SpO2 98%      Physical Exam Vitals and nursing note reviewed.  Constitutional:      General: He is not in acute distress.    Appearance: Normal appearance. He is not ill-appearing.  HENT:     Head: Normocephalic and atraumatic.  Eyes:     Conjunctiva/sclera: Conjunctivae normal.  Cardiovascular:     Rate and Rhythm: Normal rate.  Pulmonary:     Effort: Pulmonary effort is normal.  Musculoskeletal:     Comments: Mild TTP  to left anterior and posterior lateral shoulder. Decreased ROM of left shoulder due to pain.   Neurological:     Mental Status: He is alert.  Psychiatric:        Mood and Affect: Mood normal.        Behavior: Behavior  normal.        Thought Content: Thought content normal.      UC Treatments / Results  Labs (all labs ordered are listed, but only abnormal results are displayed) Labs Reviewed - No data to display  EKG   Radiology DG Shoulder Left  Result Date: 07/24/2022 CLINICAL DATA:  Fall onto left shoulder with pain. EXAM: LEFT SHOULDER - 2+ VIEW COMPARISON:  None Available. FINDINGS: Minimal degenerate change of the [facility] joint and glenohumeral joints. No evidence of acute fracture or dislocation. IMPRESSION: 1. No acute findings. 2. Mild degenerative changes. Electronically Signed   By: Marin Olp M.D.   On: 07/24/2022 09:32    Procedures Procedures (including critical care time)  Medications Ordered in UC Medications - No data to display  Initial Impression / Assessment and Plan / UC Course  I have reviewed the triage vital signs and the nursing notes.  Pertinent labs & imaging results that were available during my care of the patient were reviewed by me and considered in my medical decision making (see chart for details).    No fracture noted on xray. Will treat to cover muscular strain with steroid burst and muscle relaxer. Encouraged follow up with ortho if no improvement or any further concerns.   Final Clinical Impressions(s) / UC Diagnoses   Final diagnoses:  Acute pain of left shoulder     Discharge Instructions       Good News!!  No fracture on xray.   I have sent in prednisone burst and muscle relaxer. The prednisone may make your glucose levels go up-- please monitor this. The muscle relaxer may make you drowsy.   Please follow up with ortho if symptoms persist.       ED Prescriptions     Medication Sig Dispense Auth. Provider   predniSONE  (DELTASONE) 20 MG tablet Take 2 tablets (40 mg total) by mouth daily with breakfast for 5 days. 10 tablet Ewell Poe F, PA-C   cyclobenzaprine (FLEXERIL) 10 MG tablet Take 1 tablet (10 mg total) by mouth 2 (two) times daily as needed for muscle spasms. 20 tablet Francene Finders, PA-C      PDMP not reviewed this encounter.   Francene Finders, PA-C 07/24/22 1054

## 2022-07-24 NOTE — Discharge Instructions (Addendum)
  Good News!!  No fracture on xray.   I have sent in prednisone burst and muscle relaxer. The prednisone may make your glucose levels go up-- please monitor this. The muscle relaxer may make you drowsy.   Please follow up with ortho if symptoms persist.

## 2022-07-25 ENCOUNTER — Encounter: Payer: Self-pay | Admitting: Endocrinology

## 2022-07-25 ENCOUNTER — Ambulatory Visit: Payer: BC Managed Care – PPO | Admitting: Endocrinology

## 2022-07-25 VITALS — BP 124/78 | HR 68 | Ht 67.0 in | Wt 165.0 lb

## 2022-07-25 DIAGNOSIS — I10 Essential (primary) hypertension: Secondary | ICD-10-CM | POA: Diagnosis not present

## 2022-07-25 DIAGNOSIS — E1165 Type 2 diabetes mellitus with hyperglycemia: Secondary | ICD-10-CM | POA: Diagnosis not present

## 2022-07-25 DIAGNOSIS — Z794 Long term (current) use of insulin: Secondary | ICD-10-CM

## 2022-07-25 LAB — POCT GLUCOSE (DEVICE FOR HOME USE): Glucose Fasting, POC: 248 mg/dL — AB (ref 70–99)

## 2022-07-25 LAB — POCT GLYCOSYLATED HEMOGLOBIN (HGB A1C): Hemoglobin A1C: 11.3 % — AB (ref 4.0–5.6)

## 2022-07-25 MED ORDER — METFORMIN HCL ER 500 MG PO TB24: 2000.0000 mg | ORAL_TABLET | Freq: Every day | ORAL | 3 refills | Status: DC

## 2022-07-25 NOTE — Patient Instructions (Addendum)
Take insulin before meals  Take 50 units in am and 25 at supper with prednisone   After prednisone 40 in am and 25 at supper  Start Dexcom sensor with Clarity app

## 2022-07-25 NOTE — Progress Notes (Signed)
Patient ID: Tyler Wolf, male   DOB: April 23, 1956, 66 y.o.   MRN: [id number]           Reason for Appointment: Type II Diabetes follow-up   History of Present Illness   Diagnosis date: 2009  Previous history:  Historically his A1c has ranged between 7.6 and 10.5 His treatment in the past has included glipizide, metformin, Rybelsus,  premixed insulin, Lantus and NPH Rybelsus: Initiated in 9/22  Recent history:     Non-insulin hypoglycemic drugs: none     Insulin regimen: 35 units 70/30 in the morning, 15 at dinner          Side effects from medications: None  Current self management, blood sugar patterns and problems identified:  A1c is 11.3, 11.8 previously  He was not taking insulin on his last visit and was advised to take this regularly twice a day He says that he is however only taking insulin 3 to 4 days a week He tends to forget the insulin because of his busy schedule  Previously had been taking metformin but does not appear to have had a prescription since a year ago  He still does not check his blood sugars regularly and he thinks they are in the low 200 range before breakfast and dinner usually Lab glucose was 248 fasting today He is now taking 70/30 insulin, previously on NPH only Forgets to check readings after meals 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 3 meals a day Weight is decreased 6 pounds  Exercise: At work with remodeling houses  Diet management: Does not have a meal plan      Glucometer: Contour next  Glucose readings by recall:  PRE-MEAL Fasting Lunch Dinner Bedtime Overall  Glucose range: 220  230    Mean/median:        POST-MEAL PC Breakfast PC Lunch PC Dinner  Glucose range:     Mean/median:         Hypoglycemia:  none recently              Weight control:  Wt Readings from Last 3 Encounters:  07/25/22 165 lb (74.8 kg)  06/15/22 171 lb 9.6 oz (77.8 kg)  03/22/22 170 lb 3.2 oz (77.2 kg)             Diabetes labs:  Lab Results  Component Value Date   HGBA1C 11.3 (A) 07/25/2022   HGBA1C 11.8 (A) 03/22/2022   HGBA1C 8.3 (A) 12/23/2021   Lab Results  Component Value Date   MICROALBUR 30 11/08/2020   LDLCALC 133 (H) 07/12/2021   CREATININE 0.82 07/12/2021     Allergies as of 07/25/2022       Reactions   Latex    REACTION: Rash        Medication List        Accurate as of July 25, 2022 12:59 PM. If you have any questions, ask your nurse or doctor.          aspirin 81 MG tablet Take 81 mg by mouth daily.   atorvastatin 40 MG tablet Commonly known as: LIPITOR Take 1 tablet (40 mg total) by mouth daily.   bacitracin 500 UNIT/GM ointment Apply 1 application topically 2 (two) times daily.   cholecalciferol 25 MCG (1000 UNIT) tablet Commonly known as: VITAMIN D3 Take 2,000 Units by mouth daily.   Contour Next Test test strip Generic drug: glucose blood 1 each by Other route 2 (two) times daily. And lancets  2/day   cyclobenzaprine 10 MG tablet Commonly known as: FLEXERIL Take 1 tablet (10 mg total) by mouth 2 (two) times daily as needed for muscle spasms.   FreeStyle Libre 3 Sensor Misc 1 Device by Does not apply route every 14 (fourteen) days. Apply 1 sensor on upper arm every 14 days for continuous glucose monitoring   Dexcom G7 Sensor Misc Change every 10 days   insulin isophane & regular human KwikPen (70-30) 100 UNIT/ML KwikPen Commonly known as: HUMULIN 70/30 MIX 35 units before breakfast and 15 before dinner   Insulin Pen Needle 31G X 5 MM Misc Used to inject insulin   INSULIN SYRINGE 1CC/30GX5/16" 30G X 5/16" 1 ML Misc 1 Syringe by Does not apply route daily. Use to inject insulin daily   losartan 50 MG tablet Commonly known as: COZAAR Take 1 tablet (50 mg total) by mouth daily.   metFORMIN 500 MG 24 hr tablet Commonly known as: GLUCOPHAGE-XR Take 4 tablets (2,000 mg total) by mouth daily.   predniSONE 20 MG tablet Commonly  known as: DELTASONE Take 2 tablets (40 mg total) by mouth daily with breakfast for 5 days.   tizanidine 2 MG capsule Commonly known as: Zanaflex Take 1 capsule (2 mg total) by mouth 3 (three) times daily.        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 He has a 28.05 pack-year smoking history. He has never used smokeless tobacco. He reports that he does not drink alcohol and does not use drugs.  Review of Systems:  Last diabetic eye exam date 1/23 with Dr. Zadie Rhine, reportedly no retinopathy  Last foot exam date:9/22   Hypertension: He was started on losartan but he has not taken this recently  BP Readings from Last 3 Encounters:  07/25/22 124/78  07/24/22 (!) 159/96  06/15/22 (!) 144/68    Lipids: He was given a prescription for atorvastatin and he thinks he is taking this, last refill was in August    Lab Results  Component Value Date   CHOL 199 07/12/2021   CHOL 137 03/04/2021   CHOL 130 06/18/2019   Lab Results  Component Value Date   HDL 39 (L) 07/12/2021   HDL 41 03/04/2021   HDL 36 (L) 06/18/2019   Lab Results  Component Value Date   LDLCALC 133 (H) 07/12/2021   LDLCALC 75 03/04/2021   LDLCALC 73 06/18/2019   Lab Results  Component Value Date   TRIG 151 (H) 07/12/2021   TRIG 115 03/04/2021   TRIG 117 06/18/2019   Lab Results  Component  Value Date   CHOLHDL 5.1 (H) 07/12/2021   CHOLHDL 3.3 03/04/2021   CHOLHDL 3.6 06/18/2019   Lab Results  Component Value Date   LDLDIRECT  73 06/18/2019   LDLDIRECT 160.7 11/27/2007   LDLDIRECT 117.9 05/20/2007     Examination:   BP 124/78   Pulse 68   Ht '5\' 7"'$  (1.702 m)   Wt 165 lb (74.8 kg)   SpO2 98%   BMI 25.84 kg/m   Body mass index is 25.84 kg/m.    ASSESSMENT/ PLAN:    Diabetes type 2 insulin-dependent:   Current regimen: 70/30 insulin twice a day, metformin  A1c is 11.3 compared to 11.8  Blood glucose control has been consistently poor  Again has taken his insulin only about half the time and not clear why he is not compliant with this Also has difficulty affording a lot of medications and insulin Still not checking his blood sugars much and is to start CGM He says he has a Dexcom supplies at home and feels that he can start using his on his own  Previously on metformin but has not taken this in a while  Recommendations:  Start rechecking blood sugars on waking up 3-4 days a week and after meals at least every other day Start taking insulin consistently twice a day For now since she just started prednisone yesterday will go up to 50 units in the morning and 25 at dinnertime Once he is off the prednisone I will reduce the morning dose to 40 units  He will start the Dexcom sensor and if he needs any help starting this he will let us know, explained that he needs to download the clarity app Also start back on metformin since this may help overnight readings  LIPIDS: Has started back on Lipitor and will need follow-up labs on the next visit  Blood pressure appears to be improved possibly on weight loss Unclear whether he is taking losartan  Patient Instructions  Take insulin before meals  Take 50 units in am and 25 at supper with prednisone   After prednisone 40 in am and 25 at supper  Start Dexcom sensor with Clarity app   Elayne Snare 07/25/2022,  12:59 PM

## 2022-11-13 ENCOUNTER — Encounter (INDEPENDENT_AMBULATORY_CARE_PROVIDER_SITE_OTHER): Payer: BC Managed Care – PPO | Admitting: Ophthalmology

## 2022-11-13 ENCOUNTER — Other Ambulatory Visit (INDEPENDENT_AMBULATORY_CARE_PROVIDER_SITE_OTHER): Payer: Medicare HMO

## 2022-11-13 DIAGNOSIS — E1165 Type 2 diabetes mellitus with hyperglycemia: Secondary | ICD-10-CM | POA: Diagnosis not present

## 2022-11-13 DIAGNOSIS — Z794 Long term (current) use of insulin: Secondary | ICD-10-CM

## 2022-11-13 LAB — COMPREHENSIVE METABOLIC PANEL
ALT: 22 U/L (ref 0–53)
AST: 16 U/L (ref 0–37)
Albumin: 4.3 g/dL (ref 3.5–5.2)
Alkaline Phosphatase: 85 U/L (ref 39–117)
BUN: 17 mg/dL (ref 6–23)
CO2: 28 mEq/L (ref 19–32)
Calcium: 9.1 mg/dL (ref 8.4–10.5)
Chloride: 98 mEq/L (ref 96–112)
Creatinine, Ser: 1.02 mg/dL (ref 0.40–1.50)
GFR: 77.3 mL/min (ref >60.00)
Glucose, Bld: 444 mg/dL — ABNORMAL HIGH (ref 70–99)
Potassium: 4.6 mEq/L (ref 3.5–5.1)
Sodium: 134 mEq/L — ABNORMAL LOW (ref 135–145)
Total Bilirubin: 0.6 mg/dL (ref 0.2–1.2)
Total Protein: 7.1 g/dL (ref 6.0–8.3)

## 2022-11-13 LAB — LIPID PANEL
Cholesterol: 230 mg/dL — ABNORMAL HIGH (ref 0–200)
HDL: 42.2 mg/dL (ref >39.00)
NonHDL: 187.72
Total CHOL/HDL Ratio: 5
Triglycerides: 237 mg/dL — ABNORMAL HIGH (ref 0.0–149.0)
VLDL: 47.4 mg/dL — ABNORMAL HIGH (ref 0.0–40.0)

## 2022-11-13 LAB — MICROALBUMIN / CREATININE URINE RATIO
Creatinine,U: 71.7 mg/dL
Microalb Creat Ratio: 3 mg/g (ref 0.0–30.0)
Microalb, Ur: 2.2 mg/dL — ABNORMAL HIGH (ref 0.0–1.9)

## 2022-11-13 LAB — HEMOGLOBIN A1C: Hgb A1c MFr Bld: 12.8 % — ABNORMAL HIGH (ref 4.6–6.5)

## 2022-11-13 LAB — LDL CHOLESTEROL, DIRECT: Direct LDL: 162 mg/dL

## 2022-11-15 ENCOUNTER — Ambulatory Visit: Payer: Medicare HMO | Admitting: Endocrinology

## 2022-11-15 ENCOUNTER — Encounter: Payer: Self-pay | Admitting: Endocrinology

## 2022-11-15 VITALS — BP 138/86 | HR 80 | Ht 67.0 in | Wt 162.0 lb

## 2022-11-15 DIAGNOSIS — Z794 Long term (current) use of insulin: Secondary | ICD-10-CM

## 2022-11-15 DIAGNOSIS — E1165 Type 2 diabetes mellitus with hyperglycemia: Secondary | ICD-10-CM | POA: Diagnosis not present

## 2022-11-15 DIAGNOSIS — E782 Mixed hyperlipidemia: Secondary | ICD-10-CM | POA: Diagnosis not present

## 2022-11-15 MED ORDER — INSULIN ISOPHANE & REGULAR (HUMAN 70-30)100 UNIT/ML KWIKPEN: PEN_INJECTOR | SUBCUTANEOUS | 1 refills | Status: DC

## 2022-11-15 NOTE — Progress Notes (Signed)
Patient ID: Tyler Wolf, male   DOB: 30-Mar-1956, 67 y.o.   MRN: [id number]           Reason for Appointment: Type II Diabetes follow-up   History of Present Illness   Diagnosis date: 2009  Previous history:  Historically his A1c has ranged between 7.6 and 10.5 His treatment in the past has included glipizide, metformin, Rybelsus,  premixed insulin, Lantus and NPH Rybelsus: Initiated in 9/22  Recent history:     Non-insulin hypoglycemic drugs: none     Insulin regimen: 35 units 70/30 in the morning, 15 at dinner          Side effects from medications: None  Current self management, blood sugar patterns and problems identified:  A1c is now up at 12.8 He is again missing his insulin regimen and is not motivated to do this or check his blood sugars Lab glucose was 444  He complains of increased thirst but not any fatigue Has a gradual weight loss continuing Does not think he has changed his diet 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 3 meals a day  Weight is decreased 6 pounds  Exercise: At work with remodeling houses  Diet management: Does not have a meal plan      Glucometer: Contour next  Glucose readings not done   Hypoglycemia:  none               Weight control:  Wt Readings from Last 3 Encounters:  11/15/22 162 lb (73.5 kg)  07/25/22 165 lb (74.8 kg)  06/15/22 171 lb 9.6 oz (77.8 kg)            Diabetes labs:  Lab Results  Component Value Date   HGBA1C 12.8 (H) 11/13/2022   HGBA1C 11.3 (A) 07/25/2022   HGBA1C 11.8 (A) 03/22/2022   Lab Results  Component Value Date   MICROALBUR 2.2 (H) 11/13/2022   LDLCALC 133 (H) 07/12/2021   CREATININE 1.02 11/13/2022     Allergies as of 11/15/2022       Reactions   Latex    REACTION: Rash        Medication List        Accurate as of November 15, 2022 11:25 AM. If you have any questions, ask your nurse or doctor.          STOP taking these medications    Dexcom G7  Sensor Misc Stopped by: Elayne Snare, MD   FreeStyle Libre 3 Sensor Misc Stopped by: Elayne Snare, MD       TAKE these medications    aspirin 81 MG tablet Take 81 mg by mouth daily.   atorvastatin 40 MG tablet Commonly known as: LIPITOR Take 1 tablet (40 mg total) by mouth daily.   bacitracin 500 UNIT/GM ointment Apply 1 application topically 2 (two) times daily.   cholecalciferol 25 MCG (1000 UNIT) tablet Commonly known as: VITAMIN D3 Take 2,000 Units by mouth daily.   Contour Next Test test strip Generic drug: glucose blood 1 each by Other route 2 (two) times daily. And lancets 2/day   cyclobenzaprine 10 MG tablet Commonly known as: FLEXERIL Take 1 tablet (10 mg total) by mouth 2 (two) times daily as needed for muscle spasms.   insulin isophane & regular human KwikPen (70-30) 100 UNIT/ML KwikPen Commonly known as: HUMULIN 70/30 MIX 40 units before breakfast and 20 before dinner What changed: additional instructions Changed by: Elayne Snare, MD   Insulin Pen Needle 31G  X 5 MM Misc Used to inject insulin   INSULIN SYRINGE 1CC/30GX5/16" 30G X 5/16" 1 ML Misc 1 Syringe by Does not apply route daily. Use to inject insulin daily   losartan 50 MG tablet Commonly known as: COZAAR Take 1 tablet (50 mg total) by mouth daily.   metFORMIN 500 MG 24 hr tablet Commonly known as: GLUCOPHAGE-XR Take 4 tablets (2,000 mg total) by mouth daily.   tizanidine 2 MG capsule Commonly known as: Zanaflex Take 1 capsule (2 mg total) by mouth 3 (three) times daily.        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 He has a 28.05 pack-year smoking history. He has never used smokeless tobacco. He reports that he does not drink alcohol and does not use drugs.  Review of Systems:  Last diabetic eye exam date 1/23 with Dr. Zadie Rhine, reportedly no retinopathy  Last foot exam date:9/22   Hypertension: He was started on losartan but he has not taken this recently  BP Readings from Last 3 Encounters:  11/15/22 138/86  07/25/22 124/78  07/24/22 (!) 159/96    Lipids: He was given a prescription for atorvastatin but has not been taking this with increasing LDL as expected along with high triglycerides    Lab Results  Component Value Date   CHOL 230 (H) 11/13/2022   CHOL 199 07/12/2021   CHOL 137 03/04/2021   Lab Results  Component Value Date   HDL 42.20 11/13/2022   HDL 39 (L) 07/12/2021   HDL 41 03/04/2021   Lab Results  Component Value Date   LDLCALC 133 (H) 07/12/2021   LDLCALC 75 03/04/2021   LDLCALC 73 06/18/2019   Lab Results  Component Value Date   TRIG 237.0 (H) 11/13/2022   TRIG 151 (H) 07/12/2021   TRIG 115 03/04/2021   Lab Results  Component Value Date   CHOLHDL 5 11/13/2022   CHOLHDL 5.1 (H) 07/12/2021   CHOLHDL 3.3 03/04/2021   Lab Results  Component Value Date   LDLDIRECT 162.0 11/13/2022   LDLDIRECT 73 06/18/2019   LDLDIRECT 160.7 11/27/2007     Examination:   BP 138/86 (BP Location: Left Arm, Patient Position: Sitting, Cuff Size: Small)   Pulse 80   Ht '5\' 7"'$  (1.702 m)   Wt 162 lb (73.5 kg)   SpO2 97%   BMI 25.37 kg/m   Body mass index is 25.37 kg/m.    ASSESSMENT/ PLAN:    Diabetes type 2 insulin-dependent:   Current regimen: 70/30 insulin twice a day, metformin  A1c is 12.8  Blood glucose control has  been consistently poor  He has not taken insulin for the last couple of months and does not appear motivated to treat his diabetes Has  gradual weight loss related to hyperglycemia Also has difficulty affording a lot of medications and insulin Still not checking his blood sugars as directed Was unaware of his significant hyperglycemia He says he has a Dexcom supplies at home and still has not started using this  Hypercholesterolemia: Well-controlled on his level of his atorvastatin with LDL 162 now  Recommendations:   Start taking insulin again as directed Although he may do better with separate basal bolus insulin regimen he will likely not be as compliant with multiple injections  For now since he is unclear what his blood sugar patterns will have him take 40 units before breakfast and 20 before dinner Written instructions given Also discussed that we may need to adjust this based on her blood sugars which he needs to start monitoring regularly Discussed blood sugar targets with fasting and after meals  He will start the Dexcom sensor with the help of the diabetes educator Take metformin regularly  LIPIDS: Again needs to start back on Lipitor and will request a refill    Patient Instructions  Insulin  Take 40 units 70/30 in the morning, 20 at dinner     Refill ATORVASTATIN   Elayne Snare 11/15/2022, 11:25 AM

## 2022-11-15 NOTE — Patient Instructions (Addendum)
Insulin  Take 40 units 70/30 in the morning, 20 at dinner     Refill ATORVASTATIN

## 2022-11-27 DIAGNOSIS — H43823 Vitreomacular adhesion, bilateral: Secondary | ICD-10-CM | POA: Diagnosis not present

## 2022-11-27 DIAGNOSIS — E119 Type 2 diabetes mellitus without complications: Secondary | ICD-10-CM | POA: Diagnosis not present

## 2022-11-27 DIAGNOSIS — H2513 Age-related nuclear cataract, bilateral: Secondary | ICD-10-CM | POA: Diagnosis not present

## 2022-11-27 LAB — HM DIABETES EYE EXAM

## 2022-12-12 ENCOUNTER — Ambulatory Visit (INDEPENDENT_AMBULATORY_CARE_PROVIDER_SITE_OTHER): Payer: Medicare HMO | Admitting: Nurse Practitioner

## 2022-12-12 DIAGNOSIS — Z91199 Patient's noncompliance with other medical treatment and regimen due to unspecified reason: Secondary | ICD-10-CM

## 2022-12-12 NOTE — Progress Notes (Deleted)
  Established patient visit   Patient: Tyler Wolf   DOB: [birth year]   67 y.o. Male  MRN: [id number] Visit Date: 12/12/2022   No chief complaint on file.  Subjective    HPI  Follow up -htn -DM 2 - sees endocrinology  -overdue for medicare wellness visit  -due for diabetic eye exam   Medications: Outpatient Medications Prior to Visit  Medication Sig   aspirin 81 MG tablet Take 81 mg by mouth daily. (Patient not taking: Reported on 11/15/2022)   atorvastatin (LIPITOR) 40 MG tablet Take 1 tablet (40 mg total) by mouth daily. (Patient not taking: Reported on 11/15/2022)   bacitracin 500 UNIT/GM ointment Apply 1 application topically 2 (two) times daily. (Patient not taking: Reported on 11/15/2022)   cholecalciferol (VITAMIN D3) 25 MCG (1000 UNIT) tablet Take 2,000 Units by mouth daily. (Patient not taking: Reported on 11/15/2022)   cyclobenzaprine (FLEXERIL) 10 MG tablet Take 1 tablet (10 mg total) by mouth 2 (two) times daily as needed for muscle spasms. (Patient not taking: Reported on 11/15/2022)   glucose blood (CONTOUR NEXT TEST) test strip 1 each by Other route 2 (two) times daily. And lancets 2/day (Patient not taking: Reported on 11/15/2022)   insulin isophane & regular human KwikPen (HUMULIN 70/30 MIX) (70-30) 100 UNIT/ML KwikPen 40 units before breakfast and 20 before dinner   Insulin Pen Needle 31G X 5 MM MISC Used to inject insulin (Patient not taking: Reported on 11/15/2022)   Insulin Syringe-Needle U-100 (INSULIN SYRINGE 1CC/30GX5/16") 30G X 5/16" 1 ML MISC 1 Syringe by Does not apply route daily. Use to inject insulin daily (Patient not taking: Reported on 11/15/2022)   losartan (COZAAR) 50 MG tablet Take 1 tablet (50 mg total) by mouth daily.   metFORMIN (GLUCOPHAGE-XR) 500 MG 24 hr tablet Take 4 tablets (2,000 mg total) by mouth daily.   tizanidine (ZANAFLEX) 2 MG capsule Take 1 capsule (2 mg total) by mouth 3 (three) times daily.   No facility-administered medications prior  to visit.    Review of Systems  {Labs (Optional):23779}   Objective    There were no vitals filed for this visit. There is no height or weight on file to calculate BMI.  BP Readings from Last 3 Encounters:  11/15/22 138/86  07/25/22 124/78  07/24/22 (Abnormal) 159/96    Wt Readings from Last 3 Encounters:  11/15/22 162 lb (73.5 kg)  07/25/22 165 lb (74.8 kg)  06/15/22 171 lb 9.6 oz (77.8 kg)    Physical Exam  ***  No results found for any visits on 12/12/22.  Assessment & Plan     Problem List Items Addressed This Visit   None    No follow-ups on file.         Ronnell Freshwater, NP  [facility] at [facility] [phone] (phone) [phone] (fax)  [place]

## 2022-12-18 ENCOUNTER — Encounter: Payer: Self-pay | Admitting: Nurse Practitioner

## 2022-12-27 ENCOUNTER — Ambulatory Visit (INDEPENDENT_AMBULATORY_CARE_PROVIDER_SITE_OTHER): Payer: Medicare HMO | Admitting: Nurse Practitioner

## 2022-12-27 ENCOUNTER — Encounter: Payer: Self-pay | Admitting: Nurse Practitioner

## 2022-12-27 VITALS — BP 154/88 | HR 76 | Ht 67.0 in | Wt 159.8 lb

## 2022-12-27 DIAGNOSIS — E1159 Type 2 diabetes mellitus with other circulatory complications: Secondary | ICD-10-CM

## 2022-12-27 DIAGNOSIS — Z794 Long term (current) use of insulin: Secondary | ICD-10-CM

## 2022-12-27 DIAGNOSIS — E1165 Type 2 diabetes mellitus with hyperglycemia: Secondary | ICD-10-CM | POA: Diagnosis not present

## 2022-12-27 DIAGNOSIS — E782 Mixed hyperlipidemia: Secondary | ICD-10-CM

## 2022-12-27 DIAGNOSIS — E1169 Type 2 diabetes mellitus with other specified complication: Secondary | ICD-10-CM

## 2022-12-27 DIAGNOSIS — I152 Hypertension secondary to endocrine disorders: Secondary | ICD-10-CM | POA: Diagnosis not present

## 2022-12-27 LAB — POCT GLYCOSYLATED HEMOGLOBIN (HGB A1C): HbA1c POC (<> result, manual entry): 11.9 % (ref 4.0–5.6)

## 2022-12-27 MED ORDER — ATORVASTATIN CALCIUM 40 MG PO TABS: 40.0000 mg | ORAL_TABLET | Freq: Every day | ORAL | 1 refills | Status: DC

## 2022-12-27 MED ORDER — LOSARTAN POTASSIUM 50 MG PO TABS: 50.0000 mg | ORAL_TABLET | Freq: Every day | ORAL | 1 refills | Status: DC

## 2022-12-27 NOTE — Progress Notes (Signed)
Established patient visit   Patient: Tyler Wolf   DOB: [birth year]   67 y.o. Male  MRN: [id number] Visit Date: 12/27/2022   Chief Complaint  Patient presents with   Diabetes   Subjective    HPI  Follow up  -dm 2 - insulin dependant  -sees endocrinologist  -last month, HgbA1c was 12.8.  -today, Hgba1c is 11.9.  Patient is a current smoker.  -smoked since he was 67 years old.  -smokes about a half pack of cigarettes per day.  -would like to have lung cancer screening but would like to wait for now as he has mother in law who is ill and he and his wife are currently taking care of the mother.   Blood pressure elevated  -has been off blood pressure medication for some time.   He has no new concerns or complaints.  -He denies chest pain, chest pressure, or shortness of breath. He denies headaches or visual disturbances. He denies abdominal pain, nausea, vomiting, or changes in bowel or bladder habits.     Medications: Outpatient Medications Prior to Visit  Medication Sig   insulin isophane & regular human KwikPen (HUMULIN 70/30 MIX) (70-30) 100 UNIT/ML KwikPen 40 units before breakfast and 20 before dinner   metFORMIN (GLUCOPHAGE-XR) 500 MG 24 hr tablet Take 4 tablets (2,000 mg total) by mouth daily.   tizanidine (ZANAFLEX) 2 MG capsule Take 1 capsule (2 mg total) by mouth 3 (three) times daily.   [DISCONTINUED] losartan (COZAAR) 50 MG tablet Take 1 tablet (50 mg total) by mouth daily.   aspirin 81 MG tablet Take 81 mg by mouth daily.   bacitracin 500 UNIT/GM ointment Apply 1 application topically 2 (two) times daily.   cholecalciferol (VITAMIN D3) 25 MCG (1000 UNIT) tablet Take 2,000 Units by mouth daily.   cyclobenzaprine (FLEXERIL) 10 MG tablet Take 1 tablet (10 mg total) by mouth 2 (two) times daily as needed for muscle spasms.   glucose blood (CONTOUR NEXT TEST) test strip 1 each by Other route 2 (two) times daily. And lancets 2/day   Insulin Pen Needle 31G X 5 MM MISC  Used to inject insulin   Insulin Syringe-Needle U-100 (INSULIN SYRINGE 1CC/30GX5/16") 30G X 5/16" 1 ML MISC 1 Syringe by Does not apply route daily. Use to inject insulin daily   [DISCONTINUED] atorvastatin (LIPITOR) 40 MG tablet Take 1 tablet (40 mg total) by mouth daily. (Patient not taking: Reported on 11/15/2022)   No facility-administered medications prior to visit.    Review of Systems See HPI     Objective     Today's Vitals   12/27/22 1423  BP: (Abnormal) 154/88  Pulse: 76  SpO2: 98%  Weight: 159 lb 12.8 oz (72.5 kg)  Height:  (1.702 m)   Body mass index is 25.03 kg/m.  BP Readings from Last 3 Encounters:  01/18/23 110/80  12/27/22 (Abnormal) 154/88  11/15/22 138/86    Wt Readings from Last 3 Encounters:  01/18/23 166 lb (75.3 kg)  12/27/22 159 lb 12.8 oz (72.5 kg)  11/15/22 162 lb (73.5 kg)    Physical Exam Vitals and nursing note reviewed.  Constitutional:      Appearance: Normal appearance. He is well-developed.  HENT:     Head: Normocephalic and atraumatic.     Nose: Nose normal.     Mouth/Throat:     Mouth: Mucous membranes are moist.     Pharynx: Oropharynx is clear.  Eyes:     Extraocular Movements:  Extraocular movements intact.     Conjunctiva/sclera: Conjunctivae normal.     Pupils: Pupils are equal, round, and reactive to light.  Neck:     Vascular: No carotid bruit.  Cardiovascular:     Rate and Rhythm: Normal rate and regular rhythm.     Pulses: Normal pulses.     Heart sounds: Normal heart sounds.  Pulmonary:     Effort: Pulmonary effort is normal.     Breath sounds: Normal breath sounds.  Abdominal:     Palpations: Abdomen is soft.  Musculoskeletal:        General: Normal range of motion.     Cervical back: Normal range of motion and neck supple.  Lymphadenopathy:     Cervical: No cervical adenopathy.  Skin:    General: Skin is warm and dry.     Capillary Refill: Capillary refill takes less than 2 seconds.  Neurological:      General: No focal deficit present.     Mental Status: He is alert and oriented to person, place, and time.  Psychiatric:        Mood and Affect: Mood normal.        Behavior: Behavior normal.        Thought Content: Thought content normal.        Judgment: Judgment normal.      Results for orders placed or performed in visit on 12/27/22  POCT glycosylated hemoglobin (Hb A1C)  Result Value Ref Range   Hemoglobin A1C     HbA1c POC (<> result, manual entry) 11.9 4.0 - 5.6 %   HbA1c, POC (prediabetic range)     HbA1c, POC (controlled diabetic range)      Assessment & Plan    Type 2 diabetes mellitus with other specified complication, with long-term current use of insulin (HCC) -     POCT glycosylated hemoglobin (Hb A1C)  Mixed diabetic hyperlipidemia associated with type 2 diabetes mellitus (HCC) Assessment & Plan: -Continue current medication regimen. -Will repeat lipid panel and hepatic function. -Recommend patient limit intake of fried and fatty foods. He should increase intake of lean proteins and green leafy vegetables. Adding exercise into daily routine will also be beneficial.    Orders: -     POCT glycosylated hemoglobin (Hb A1C) -     Atorvastatin Calcium; Take 1 tablet (40 mg total) by mouth daily.  Dispense: 90 tablet; Refill: 1  Hypertension associated with type 2 diabetes mellitus (HCC) Assessment & Plan: -BP initially elevated, BP recheck improved and stable. -Continue current medication regimen.   Orders: -     POCT glycosylated hemoglobin (Hb A1C) -     Losartan Potassium; Take 1 tablet (50 mg total) by mouth daily.  Dispense: 90 tablet; Refill: 1  Poorly controlled diabetes mellitus (HCC)- mgt per Endo; Dr Everardo All Assessment & Plan: HgbA1c slightly improved at 11.9 today, down from 12.3 at last check. Continue regullar visits with endocrinology and medication regimen as prescribed.       Return in about 6 weeks (around 02/07/2023) for blood pressure, FBW  a week prior to visit. will need urine microalbumin. needs MWV when possible.         Carlean Jews, NP  [facility] at [facility] [phone] (phone) [phone] (fax)  [facility]

## 2023-01-15 ENCOUNTER — Other Ambulatory Visit (INDEPENDENT_AMBULATORY_CARE_PROVIDER_SITE_OTHER): Payer: Medicare HMO

## 2023-01-15 DIAGNOSIS — Z794 Long term (current) use of insulin: Secondary | ICD-10-CM | POA: Diagnosis not present

## 2023-01-15 DIAGNOSIS — E1165 Type 2 diabetes mellitus with hyperglycemia: Secondary | ICD-10-CM

## 2023-01-15 LAB — BASIC METABOLIC PANEL
BUN: 20 mg/dL (ref 6–23)
CO2: 29 mEq/L (ref 19–32)
Calcium: 9.7 mg/dL (ref 8.4–10.5)
Chloride: 98 mEq/L (ref 96–112)
Creatinine, Ser: 0.86 mg/dL (ref 0.40–1.50)
GFR: 90.96 mL/min (ref >60.00)
Glucose, Bld: 313 mg/dL — ABNORMAL HIGH (ref 70–99)
Potassium: 4 mEq/L (ref 3.5–5.1)
Sodium: 132 mEq/L — ABNORMAL LOW (ref 135–145)

## 2023-01-16 LAB — FRUCTOSAMINE: Fructosamine: 521 umol/L — ABNORMAL HIGH (ref 0–285)

## 2023-01-18 ENCOUNTER — Encounter: Payer: Self-pay | Admitting: Endocrinology

## 2023-01-18 ENCOUNTER — Ambulatory Visit: Payer: Medicare HMO | Admitting: Endocrinology

## 2023-01-18 VITALS — BP 110/80 | HR 102 | Ht 67.0 in | Wt 166.0 lb

## 2023-01-18 DIAGNOSIS — Z794 Long term (current) use of insulin: Secondary | ICD-10-CM | POA: Diagnosis not present

## 2023-01-18 DIAGNOSIS — I10 Essential (primary) hypertension: Secondary | ICD-10-CM

## 2023-01-18 DIAGNOSIS — E1165 Type 2 diabetes mellitus with hyperglycemia: Secondary | ICD-10-CM

## 2023-01-18 DIAGNOSIS — E782 Mixed hyperlipidemia: Secondary | ICD-10-CM

## 2023-01-18 LAB — GLUCOSE, POCT (MANUAL RESULT ENTRY): POC Glucose: 311 mg/dl — AB (ref 70–99)

## 2023-01-18 NOTE — Progress Notes (Signed)
Patient ID: Tyler Wolf, male   DOB: 12/23/1955, 67 y.o.   MRN: [id number]           Reason for Appointment: Type II Diabetes follow-up   History of Present Illness   Diagnosis date: 2009  Previous history:  Historically his A1c has ranged between 7.6 and 10.5 His treatment in the past has included glipizide, metformin, Rybelsus,  premixed insulin, Lantus and NPH Rybelsus: Initiated in 9/22  Recent history:     Non-insulin hypoglycemic drugs: none     Insulin regimen: 40 units of 70/30 in the morning, 20 at dinner          Side effects from medications: None  Current self management, blood sugar patterns and problems identified:  A1c is last 11.9, previously was at 12.8  Now fructosamine is 521  He is again getting poor control from lack of regular insulin regimen His likely not taking his insulin in the mornings as he forgets and not clear how often he takes it in the evening  His fasting lab glucose was 313 and today also is about the same at 311 He was supposed to start using the Dexcom sensor but he says he cannot find at home and did not let us know Also appears to be gaining some weight despite his high sugars Insulin was increased by 5 units on the last visit twice a day He has been maintained on premixed insulin since unlikely he will take separate basal and bolus insulins  Occasionally if he is late for lunch he may feel hypoglycemic, this happened 2 to 3 weeks ago  He just started Medicare last November and not clear what his coverage is for [facility] He says he is avoiding sweet drinks and only drinking water and unsweetened tea  Weight is decreased 6 pounds  Exercise: At work with remodeling houses  Diet management: Does not have a meal plan      Glucometer: Contour next  Glucose readings not done at home               Weight control:  Wt Readings from Last 3 Encounters:  01/18/23 166 lb (75.3 kg)  12/27/22 159 lb 12.8 oz (72.5 kg)  11/15/22 162 lb  (73.5 kg)            Diabetes labs:  Lab Results  Component Value Date   HGBA1C 11.9 12/27/2022   HGBA1C 12.8 (H) 11/13/2022   HGBA1C 11.3 (A) 07/25/2022   Lab Results  Component Value Date   MICROALBUR 2.2 (H) 11/13/2022   LDLCALC 133 (H) 07/12/2021   CREATININE 0.86 01/15/2023   Lab Results  Component Value Date   FRUCTOSAMINE 521 (H) 01/15/2023     Allergies as of 01/18/2023       Reactions   Latex    REACTION: Rash        Medication List        Accurate as of January 18, 2023  4:25 PM. If you have any questions, ask your nurse or doctor.          aspirin 81 MG tablet Take 81 mg by mouth daily.   atorvastatin 40 MG tablet Commonly known as: LIPITOR Take 1 tablet (40 mg total) by mouth daily.   bacitracin 500 UNIT/GM ointment Apply 1 application topically 2 (two) times daily.   cholecalciferol 25 MCG (1000 UNIT) tablet Commonly known as: VITAMIN D3 Take 2,000 Units by mouth daily.   Contour Next Test test strip  Generic drug: glucose blood 1 each by Other route 2 (two) times daily. And lancets 2/day   cyclobenzaprine 10 MG tablet Commonly known as: FLEXERIL Take 1 tablet (10 mg total) by mouth 2 (two) times daily as needed for muscle spasms.   insulin isophane & regular human KwikPen (70-30) 100 UNIT/ML KwikPen Commonly known as: HUMULIN 70/30 MIX 40 units before breakfast and 20 before dinner   Insulin Pen Needle 31G X 5 MM Misc Used to inject insulin   INSULIN SYRINGE 1CC/30GX5/16" 30G X 5/16" 1 ML Misc 1 Syringe by Does not apply route daily. Use to inject insulin daily   losartan 50 MG tablet Commonly known as: COZAAR Take 1 tablet (50 mg total) by mouth daily.   metFORMIN 500 MG 24 hr tablet Commonly known as: GLUCOPHAGE-XR Take 4 tablets (2,000 mg total) by mouth daily.   tizanidine 2 MG capsule Commonly known as: Zanaflex Take 1 capsule (2 mg total) by mouth 3 (three) times daily.        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 He has a 28.05 pack-year smoking history. He has never used smokeless tobacco. He reports that he does not drink alcohol and does not use drugs.  Review of Systems:  Last diabetic eye exam date 1/23 with Dr. Zadie Rhine, reportedly no retinopathy  Last foot exam date:9/22   Hypertension: He was started on losartan with better blood pressure readings  BP Readings from Last 3 Encounters:  01/18/23 110/80  12/27/22 (!) 154/88  11/15/22 138/86    Lipids: He was given a prescription for atorvastatin previously by PCP but he has not taken this     Lab Results  Component Value Date   CHOL 230 (H) 11/13/2022   CHOL 199 07/12/2021   CHOL 137 03/04/2021   Lab Results  Component Value Date   HDL 42.20 11/13/2022   HDL 39 (L) 07/12/2021   HDL 41 03/04/2021   Lab Results  Component Value Date   LDLCALC 133 (H) 07/12/2021   LDLCALC 75 03/04/2021   LDLCALC 73 06/18/2019   Lab Results  Component Value Date   TRIG 237.0 (H) 11/13/2022   TRIG 151 (H) 07/12/2021   TRIG 115 03/04/2021   Lab Results  Component Value Date   CHOLHDL 5 11/13/2022   CHOLHDL 5.1 (H) 07/12/2021   CHOLHDL 3.3 03/04/2021   Lab Results  Component Value Date   LDLDIRECT 162.0 11/13/2022   LDLDIRECT 73 06/18/2019   LDLDIRECT 160.7 11/27/2007     Examination:   BP 110/80   Pulse (!) 102    Ht 5\' 7"  (1.702 m)   Wt 166 lb (75.3 kg)   SpO2 96%   BMI 26.00 kg/m   Body mass index  is 26 kg/m.    ASSESSMENT/ PLAN:    Diabetes type 2 insulin-dependent:   Current regimen: 70/30 insulin twice a day, metformin  A1c is last 11.9 Recently fructosamine is 521  Blood glucose control has been consistently poor from lack of compliance with insulin Because of lack of glucose monitoring at home difficult to know what his true insulin dose will be Today discussed the possibility of using the V-go or the OmniPod pumps for better compliance However he wants to wait till the next visit Blood sugar on V-go pump was given  Discussed need to take insulin consistently and he will go up to 25 units at dinnertime since likely has higher fasting readings Also make sure he does not miss lunch or at least take snacks when he is late for lunch  Hypercholesterolemia: Well-controlled on his level of his atorvastatin with LDL 162 now  Recommendations:   As above  He will start the Dexcom sensor with the help of the diabetes educator Prescription will need to be sent through the [facility] website  Take metformin regularly  LIPIDS: To follow-up on the next visit, make sure he is continuing to take his atorvastatin as reported    Patient Instructions  Go to 25 at supper  Snack if late for lunch   Elayne Snare 01/18/2023, 4:25 PM

## 2023-01-18 NOTE — Patient Instructions (Signed)
Go to 25 at supper  Snack if late for lunch

## 2023-02-01 ENCOUNTER — Other Ambulatory Visit: Payer: Medicare HMO

## 2023-02-03 NOTE — Assessment & Plan Note (Signed)
HgbA1c slightly improved at 11.9 today, down from 12.3 at last check. Continue regullar visits with endocrinology and medication regimen as prescribed.

## 2023-02-03 NOTE — Assessment & Plan Note (Signed)
-  BP initially elevated, BP recheck improved and stable. -Continue current medication regimen.

## 2023-02-03 NOTE — Assessment & Plan Note (Signed)
-  Continue current medication regimen. -Will repeat lipid panel and hepatic function. -Recommend patient limit intake of fried and fatty foods. He should increase intake of lean proteins and green leafy vegetables. Adding exercise into daily routine will also be beneficial.

## 2023-02-07 ENCOUNTER — Ambulatory Visit (INDEPENDENT_AMBULATORY_CARE_PROVIDER_SITE_OTHER): Payer: Medicare HMO | Admitting: Nurse Practitioner

## 2023-02-07 DIAGNOSIS — Z91199 Patient's noncompliance with other medical treatment and regimen due to unspecified reason: Secondary | ICD-10-CM

## 2023-02-07 NOTE — Progress Notes (Signed)
Established patient visit   Patient: Tyler Wolf   DOB: [birth year]   67 y.o. Male  MRN: [id number] Visit Date: 02/07/2023   No chief complaint on file.  Subjective    HPI  Follow up  -blood pressure -needs urine microalbumin  -sees endocrinology for insulin dependant type 2 diabetes    Medications: Outpatient Medications Prior to Visit  Medication Sig   aspirin 81 MG tablet Take 81 mg by mouth Wolf.   atorvastatin (LIPITOR) 40 MG tablet Take 1 tablet (40 mg total) by mouth Wolf.   bacitracin 500 UNIT/GM ointment Apply 1 application topically 2 (two) times Wolf.   cholecalciferol (VITAMIN D3) 25 MCG (1000 UNIT) tablet Take 2,000 Units by mouth Wolf.   cyclobenzaprine (FLEXERIL) 10 MG tablet Take 1 tablet (10 mg total) by mouth 2 (two) times Wolf as needed for muscle spasms.   glucose blood (CONTOUR NEXT TEST) test strip 1 each by Other route 2 (two) times Wolf. And lancets 2/day   insulin isophane & regular human KwikPen (HUMULIN 70/30 MIX) (70-30) 100 UNIT/ML KwikPen 40 units before breakfast and 20 before dinner   Insulin Pen Needle 31G X 5 MM MISC Used to inject insulin   Insulin Syringe-Needle U-100 (INSULIN SYRINGE 1CC/30GX5/16") 30G X 5/16" 1 ML MISC 1 Syringe by Does not apply route Wolf. Use to inject insulin Wolf   losartan (COZAAR) 50 MG tablet Take 1 tablet (50 mg total) by mouth Wolf.   metFORMIN (GLUCOPHAGE-XR) 500 MG 24 hr tablet Take 4 tablets (2,000 mg total) by mouth Wolf.   tizanidine (ZANAFLEX) 2 MG capsule Take 1 capsule (2 mg total) by mouth 3 (three) times Wolf.   No facility-administered medications prior to visit.    Review of Systems     Objective    There were no vitals filed for this visit. There is no height or weight on file to calculate BMI.  BP Readings from Last 3 Encounters:  01/18/23 110/80  12/27/22 (Abnormal) 154/88  11/15/22 138/86    Wt Readings from Last 3 Encounters:  01/18/23 166 lb (75.3 kg)  12/27/22 159 lb  12.8 oz (72.5 kg)  11/15/22 162 lb (73.5 kg)    Physical Exam    No results found for any visits on 02/07/23.  Assessment & Plan    There are no diagnoses linked to this encounter.   No follow-ups on file.         Carlean Jews, NP  [facility] at [facility] [phone] (phone) [phone] (fax)  [facility]

## 2023-02-14 NOTE — Progress Notes (Signed)
Patient was no show for appointment.  

## 2023-02-20 ENCOUNTER — Ambulatory Visit: Payer: Medicare HMO | Admitting: "Endocrinology

## 2023-02-26 ENCOUNTER — Other Ambulatory Visit: Payer: Medicare HMO

## 2023-02-26 DIAGNOSIS — E782 Mixed hyperlipidemia: Secondary | ICD-10-CM | POA: Diagnosis not present

## 2023-02-26 DIAGNOSIS — E1159 Type 2 diabetes mellitus with other circulatory complications: Secondary | ICD-10-CM

## 2023-02-26 DIAGNOSIS — E1169 Type 2 diabetes mellitus with other specified complication: Secondary | ICD-10-CM | POA: Diagnosis not present

## 2023-02-26 DIAGNOSIS — Z0000 Encounter for general adult medical examination without abnormal findings: Secondary | ICD-10-CM

## 2023-02-26 DIAGNOSIS — Z794 Long term (current) use of insulin: Secondary | ICD-10-CM | POA: Diagnosis not present

## 2023-02-26 DIAGNOSIS — I152 Hypertension secondary to endocrine disorders: Secondary | ICD-10-CM | POA: Diagnosis not present

## 2023-02-27 LAB — CBC WITH DIFFERENTIAL/PLATELET
Basophils Absolute: 0 10*3/uL (ref 0.0–0.2)
Basos: 0 %
EOS (ABSOLUTE): 0.2 10*3/uL (ref 0.0–0.4)
Eos: 2 %
Hematocrit: 47.4 % (ref 37.5–51.0)
Hemoglobin: 16.4 g/dL (ref 13.0–17.7)
Immature Grans (Abs): 0.1 10*3/uL (ref 0.0–0.1)
Immature Granulocytes: 1 %
Lymphocytes Absolute: 1.4 10*3/uL (ref 0.7–3.1)
Lymphs: 15 %
MCH: 30.8 pg (ref 26.6–33.0)
MCHC: 34.6 g/dL (ref 31.5–35.7)
MCV: 89 fL (ref 79–97)
Monocytes Absolute: 0.7 10*3/uL (ref 0.1–0.9)
Monocytes: 8 %
Neutrophils Absolute: 6.8 10*3/uL (ref 1.4–7.0)
Neutrophils: 74 %
Platelets: 256 10*3/uL (ref 150–450)
RBC: 5.32 x10E6/uL (ref 4.14–5.80)
RDW: 12.9 % (ref 11.6–15.4)
WBC: 9.2 10*3/uL (ref 3.4–10.8)

## 2023-02-27 LAB — COMPREHENSIVE METABOLIC PANEL
ALT: 27 IU/L (ref 0–44)
AST: 20 IU/L (ref 0–40)
Albumin/Globulin Ratio: 1.6 (ref 1.2–2.2)
Albumin: 4.6 g/dL (ref 3.9–4.9)
Alkaline Phosphatase: 90 IU/L (ref 44–121)
BUN/Creatinine Ratio: 21 (ref 10–24)
BUN: 15 mg/dL (ref 8–27)
Bilirubin Total: 0.5 mg/dL (ref 0.0–1.2)
CO2: 23 mmol/L (ref 20–29)
Calcium: 9.8 mg/dL (ref 8.6–10.2)
Chloride: 101 mmol/L (ref 96–106)
Creatinine, Ser: 0.73 mg/dL — ABNORMAL LOW (ref 0.76–1.27)
Globulin, Total: 2.8 g/dL (ref 1.5–4.5)
Glucose: 158 mg/dL — ABNORMAL HIGH (ref 70–99)
Potassium: 4.6 mmol/L (ref 3.5–5.2)
Sodium: 142 mmol/L (ref 134–144)
Total Protein: 7.4 g/dL (ref 6.0–8.5)
eGFR: 101 mL/min/{1.73_m2} (ref >59)

## 2023-02-27 LAB — LIPID PANEL
Chol/HDL Ratio: 4.2 ratio (ref 0.0–5.0)
Cholesterol, Total: 191 mg/dL (ref 100–199)
HDL: 46 mg/dL (ref >39)
LDL Chol Calc (NIH): 128 mg/dL — ABNORMAL HIGH (ref 0–99)
Triglycerides: 93 mg/dL (ref 0–149)
VLDL Cholesterol Cal: 17 mg/dL (ref 5–40)

## 2023-02-27 LAB — HEMOGLOBIN A1C
Est. average glucose Bld gHb Est-mCnc: 286 mg/dL
Hgb A1c MFr Bld: 11.6 % — ABNORMAL HIGH (ref 4.8–5.6)

## 2023-02-27 LAB — MICROALBUMIN / CREATININE URINE RATIO
Creatinine, Urine: 89.3 mg/dL
Microalb/Creat Ratio: 13 mg/g creat (ref 0–29)
Microalbumin, Urine: 11.9 ug/mL

## 2023-02-27 LAB — TSH: TSH: 2.49 u[IU]/mL (ref 0.450–4.500)

## 2023-03-04 NOTE — Progress Notes (Signed)
Established patient visit   Patient: Tyler Wolf   DOB: 02/04/1956   67 y.o. Male  MRN: [id number] Visit Date: 03/05/2023   Chief Complaint  Patient presents with   Medical Management of Chronic Issues   Subjective    HPI  Follow up  -hypertension  --moderate elevation at last visit  -Blood pressure still elevated today. -routine, fasting labs done since last visit -mild elevation of LDL  --other labs stable or within normal limits. -Patient reports increased personal stress.  Mother-in-law passed away beginning of Feb 25, 2024.  Having to manage his wife's grief while working. --Sometimes forgetting to take medication due to increased stress.  Bilateral shoulder pain.  Worse on left than right. -Previous injury a few years back.  Did not go through [employer].  No longer pursuing this option. -Limited range of motion on the left and right.  This is worse on the left. - He denies chest pain, chest pressure, or shortness of breath. He denies headaches or visual disturbances. He denies abdominal pain, nausea, vomiting, or changes in bowel or bladder habits.     Medications: Outpatient Medications Prior to Visit  Medication Sig   aspirin 81 MG tablet Take 81 mg by mouth daily.   atorvastatin (LIPITOR) 40 MG tablet Take 1 tablet (40 mg total) by mouth daily.   bacitracin 500 UNIT/GM ointment Apply 1 application topically 2 (two) times daily.   cholecalciferol (VITAMIN D3) 25 MCG (1000 UNIT) tablet Take 2,000 Units by mouth daily.   cyclobenzaprine (FLEXERIL) 10 MG tablet Take 1 tablet (10 mg total) by mouth 2 (two) times daily as needed for muscle spasms.   glucose blood (CONTOUR NEXT TEST) test strip 1 each by Other route 2 (two) times daily. And lancets 2/day   insulin isophane & regular human KwikPen (HUMULIN 70/30 MIX) (70-30) 100 UNIT/ML KwikPen 40 units before breakfast and 20 before dinner   Insulin Pen Needle 31G X 5 MM MISC Used to inject insulin   Insulin Syringe-Needle  U-100 (INSULIN SYRINGE 1CC/30GX5/16") 30G X 5/16" 1 ML MISC 1 Syringe by Does not apply route daily. Use to inject insulin daily   metFORMIN (GLUCOPHAGE-XR) 500 MG 24 hr tablet Take 4 tablets (2,000 mg total) by mouth daily.   tizanidine (ZANAFLEX) 2 MG capsule Take 1 capsule (2 mg total) by mouth 3 (three) times daily.   [DISCONTINUED] losartan (COZAAR) 50 MG tablet Take 1 tablet (50 mg total) by mouth daily.   No facility-administered medications prior to visit.    Review of Systems See HPI    Last CBC Lab Results  Component Value Date   WBC 9.2 02/26/2023   HGB 16.4 02/26/2023   HCT 47.4 02/26/2023   MCV 89 02/26/2023   MCH 30.8 02/26/2023   RDW 12.9 02/26/2023   PLT 256 02/26/2023   Last metabolic panel Lab Results  Component Value Date   GLUCOSE 158 (H) 02/26/2023   NA 142 02/26/2023   K 4.6 02/26/2023   CL 101 02/26/2023   CO2 23 02/26/2023   BUN 15 02/26/2023   CREATININE 0.73 (L) 02/26/2023   EGFR 101 02/26/2023   CALCIUM 9.8 02/26/2023   PROT 7.4 02/26/2023   ALBUMIN 4.6 02/26/2023   LABGLOB 2.8 02/26/2023   AGRATIO 1.6 02/26/2023   BILITOT 0.5 02/26/2023   ALKPHOS 90 02/26/2023   AST 20 02/26/2023   ALT 27 02/26/2023   Last lipids Lab Results  Component Value Date   CHOL 191 02/26/2023   HDL  46 02/26/2023   LDLCALC 128 (H) 02/26/2023   LDLDIRECT 162.0 11/13/2022   TRIG 93 02/26/2023   CHOLHDL 4.2 02/26/2023   Last hemoglobin A1c Lab Results  Component Value Date   HGBA1C 11.6 (H) 02/26/2023   Last thyroid functions Lab Results  Component Value Date   TSH 2.490 02/26/2023       Objective     Today's Vitals   03/05/23 1116  BP: (Abnormal) 167/98  Pulse: 100  SpO2: 97%  Weight: 171 lb 12.8 oz (77.9 kg)  Height: 5\' 7"  (1.702 m)   Body mass index is 26.91 kg/m.  BP Readings from Last 3 Encounters:  03/05/23 (Abnormal) 167/98  01/18/23 110/80  12/27/22 (Abnormal) 154/88    Wt Readings from Last 3 Encounters:  03/05/23 171 lb  12.8 oz (77.9 kg)  01/18/23 166 lb (75.3 kg)  12/27/22 159 lb 12.8 oz (72.5 kg)    Physical Exam Vitals and nursing note reviewed.  Constitutional:      Appearance: Normal appearance. He is well-developed.  HENT:     Head: Normocephalic and atraumatic.     Nose: Nose normal.     Mouth/Throat:     Mouth: Mucous membranes are moist.     Pharynx: Oropharynx is clear.  Eyes:     Extraocular Movements: Extraocular movements intact.     Conjunctiva/sclera: Conjunctivae normal.     Pupils: Pupils are equal, round, and reactive to light.  Neck:     Vascular: No carotid bruit.  Cardiovascular:     Rate and Rhythm: Normal rate and regular rhythm.     Pulses: Normal pulses.     Heart sounds: Normal heart sounds.  Pulmonary:     Effort: Pulmonary effort is normal.     Breath sounds: Normal breath sounds.  Abdominal:     Palpations: Abdomen is soft.  Musculoskeletal:     Right shoulder: Tenderness present. Decreased range of motion. Decreased strength.     Left shoulder: Tenderness present. Decreased range of motion. Decreased strength.     Cervical back: Normal range of motion and neck supple.     Comments: More severe on left side than right.  Lymphadenopathy:     Cervical: No cervical adenopathy.  Skin:    General: Skin is warm and dry.     Capillary Refill: Capillary refill takes less than 2 seconds.  Neurological:     General: No focal deficit present.     Mental Status: He is alert and oriented to person, place, and time.  Psychiatric:        Mood and Affect: Mood normal.        Behavior: Behavior normal.        Thought Content: Thought content normal.        Judgment: Judgment normal.      Assessment & Plan    Hypertension associated with type 2 diabetes mellitus (HCC) Assessment & Plan: Blood pressure persistently elevated -Increase losartan to 100 mg daily. -Recommend DASH diet water. -Reassess in 3 weeks.  Orders: -     Losartan Potassium; Take 1 tablet (100 mg  total) by mouth daily.  Dispense: 90 tablet; Refill: 1  Combined hyperlipidemia associated with type 2 diabetes mellitus [facility]) Assessment & Plan: Recent lipid panel -   Lipid Panel     Component Value Date/Time   CHOL 191 02/26/2023 0935   TRIG 93 02/26/2023 0935   TRIG 189 (H) 09/10/2006 1325   HDL 46 02/26/2023 0935  CHOLHDL 4.2 02/26/2023 0935   CHOLHDL 5 11/13/2022 0914   VLDL 47.4 (H) 11/13/2022 0914   LDLCALC 128 (H) 02/26/2023 0935   LDLDIRECT 162.0 11/13/2022 0914   LABVLDL 17 02/26/2023 0935    Continue statin as previously prescribed. -Continue with low-fat and heart healthy diet.   Chronic pain of both shoulders Assessment & Plan: This is now chronic after injury from work a few years back. -Refer to orthopedics for further evaluation and treatment.  Orders: -     Ambulatory referral to Orthopedic Surgery     Return in 3 weeks (on 03/26/2023) for blood pressure - increase losartan dose.         Carlean Jews, NP  [facility] at [facility] [phone] (phone) [phone] (fax)  [facility]

## 2023-03-05 ENCOUNTER — Ambulatory Visit (INDEPENDENT_AMBULATORY_CARE_PROVIDER_SITE_OTHER): Payer: Medicare HMO | Admitting: Nurse Practitioner

## 2023-03-05 ENCOUNTER — Encounter: Payer: Self-pay | Admitting: Nurse Practitioner

## 2023-03-05 VITALS — BP 167/98 | HR 100 | Ht 67.0 in | Wt 171.8 lb

## 2023-03-05 DIAGNOSIS — E1159 Type 2 diabetes mellitus with other circulatory complications: Secondary | ICD-10-CM

## 2023-03-05 DIAGNOSIS — Z7984 Long term (current) use of oral hypoglycemic drugs: Secondary | ICD-10-CM | POA: Diagnosis not present

## 2023-03-05 DIAGNOSIS — M25511 Pain in right shoulder: Secondary | ICD-10-CM

## 2023-03-05 DIAGNOSIS — I152 Hypertension secondary to endocrine disorders: Secondary | ICD-10-CM

## 2023-03-05 DIAGNOSIS — E782 Mixed hyperlipidemia: Secondary | ICD-10-CM | POA: Diagnosis not present

## 2023-03-05 DIAGNOSIS — E1169 Type 2 diabetes mellitus with other specified complication: Secondary | ICD-10-CM

## 2023-03-05 DIAGNOSIS — M25512 Pain in left shoulder: Secondary | ICD-10-CM | POA: Diagnosis not present

## 2023-03-05 DIAGNOSIS — G8929 Other chronic pain: Secondary | ICD-10-CM

## 2023-03-05 MED ORDER — LOSARTAN POTASSIUM 100 MG PO TABS
100.0000 mg | ORAL_TABLET | Freq: Every day | ORAL | 1 refills | Status: DC
Start: 2023-03-05 — End: 2023-07-24

## 2023-03-05 NOTE — Assessment & Plan Note (Signed)
This is now chronic after injury from work a few years back. -Refer to orthopedics for further evaluation and treatment.

## 2023-03-05 NOTE — Assessment & Plan Note (Signed)
>>  ASSESSMENT AND PLAN FOR HYPERLIPIDEMIA, MIXED WRITTEN ON 03/05/2023 12:17 PM BY Hermenia Fiscal, HEATHER E, NP  Recent lipid panel -   Lipid Panel     Component Value Date/Time   CHOL 191 02/26/2023 0935   TRIG 93 02/26/2023 0935   TRIG 189 (H) 09/10/2006 1325   HDL 46 02/26/2023 0935   CHOLHDL 4.2 02/26/2023 0935   CHOLHDL 5 11/13/2022 0914   VLDL 47.4 (H) 11/13/2022 0914   LDLCALC 128 (H) 02/26/2023 0935   LDLDIRECT 162.0 11/13/2022 0914   LABVLDL 17 02/26/2023 0935    Continue statin as previously prescribed. -Continue with low-fat and heart healthy diet.

## 2023-03-05 NOTE — Assessment & Plan Note (Signed)
Recent lipid panel -   Lipid Panel     Component Value Date/Time   CHOL 191 02/26/2023 0935   TRIG 93 02/26/2023 0935   TRIG 189 (H) 09/10/2006 1325   HDL 46 02/26/2023 0935   CHOLHDL 4.2 02/26/2023 0935   CHOLHDL 5 11/13/2022 0914   VLDL 47.4 (H) 11/13/2022 0914   LDLCALC 128 (H) 02/26/2023 0935   LDLDIRECT 162.0 11/13/2022 0914   LABVLDL 17 02/26/2023 0935    Continue statin as previously prescribed. -Continue with low-fat and heart healthy diet.

## 2023-03-05 NOTE — Assessment & Plan Note (Signed)
Blood pressure persistently elevated -Increase losartan to 100 mg daily. -Recommend DASH diet water. -Reassess in 3 weeks.

## 2023-03-15 NOTE — Addendum Note (Signed)
Addended by: Vincent Gros on: 03/15/2023 06:07 AM   Modules accepted: Level of Service

## 2023-03-16 NOTE — Addendum Note (Signed)
Addended by: Vincent Gros on: 03/16/2023 06:27 AM   Modules accepted: Level of Service

## 2023-03-19 ENCOUNTER — Encounter: Payer: Medicare HMO | Admitting: Nurse Practitioner

## 2023-03-22 DIAGNOSIS — M25512 Pain in left shoulder: Secondary | ICD-10-CM | POA: Diagnosis not present

## 2023-03-22 DIAGNOSIS — M25511 Pain in right shoulder: Secondary | ICD-10-CM | POA: Diagnosis not present

## 2023-03-26 ENCOUNTER — Encounter: Payer: Self-pay | Admitting: Nurse Practitioner

## 2023-03-26 ENCOUNTER — Ambulatory Visit (INDEPENDENT_AMBULATORY_CARE_PROVIDER_SITE_OTHER): Payer: Medicare HMO | Admitting: Nurse Practitioner

## 2023-03-26 VITALS — BP 126/73 | HR 62 | Ht 67.0 in | Wt 163.4 lb

## 2023-03-26 DIAGNOSIS — E1165 Type 2 diabetes mellitus with hyperglycemia: Secondary | ICD-10-CM

## 2023-03-26 DIAGNOSIS — I152 Hypertension secondary to endocrine disorders: Secondary | ICD-10-CM

## 2023-03-26 DIAGNOSIS — E1169 Type 2 diabetes mellitus with other specified complication: Secondary | ICD-10-CM | POA: Diagnosis not present

## 2023-03-26 DIAGNOSIS — E1159 Type 2 diabetes mellitus with other circulatory complications: Secondary | ICD-10-CM

## 2023-03-26 DIAGNOSIS — Z7984 Long term (current) use of oral hypoglycemic drugs: Secondary | ICD-10-CM

## 2023-03-26 DIAGNOSIS — E782 Mixed hyperlipidemia: Secondary | ICD-10-CM | POA: Diagnosis not present

## 2023-03-26 NOTE — Assessment & Plan Note (Addendum)
HgbA1c slightly improved at 11.6, down from 12.3 at last check. Continue regullar visits with endocrinology and medication regimen as prescribed.

## 2023-03-26 NOTE — Assessment & Plan Note (Signed)
Improved and stable.  -continue higher dose losartan as prescribed

## 2023-03-26 NOTE — Progress Notes (Signed)
Established patient visit   Patient: Tyler Wolf   DOB: 20-Apr-1956   67 y.o. Male  MRN: [id number] Visit Date: 03/26/2023   Chief Complaint  Patient presents with   Medical Management of Chronic Issues   Subjective    HPI  Follow up  -hypertension --increased losartan to 100 mg daily  -blood pressure has improved. Slightly elevated at start of visit.  -has tolerated the medication change well -no new concerns or complaints   -He denies chest pain, chest pressure, or shortness of breath. He denies headaches or visual disturbances. He denies abdominal pain, nausea, vomiting, or changes in bowel or bladder habits.      Medications: Outpatient Medications Prior to Visit  Medication Sig   aspirin 81 MG tablet Take 81 mg by mouth daily.   atorvastatin (LIPITOR) 40 MG tablet Take 1 tablet (40 mg total) by mouth daily.   bacitracin 500 UNIT/GM ointment Apply 1 application topically 2 (two) times daily.   cholecalciferol (VITAMIN D3) 25 MCG (1000 UNIT) tablet Take 2,000 Units by mouth daily.   cyclobenzaprine (FLEXERIL) 10 MG tablet Take 1 tablet (10 mg total) by mouth 2 (two) times daily as needed for muscle spasms.   glucose blood (CONTOUR NEXT TEST) test strip 1 each by Other route 2 (two) times daily. And lancets 2/day   insulin isophane & regular human KwikPen (HUMULIN 70/30 MIX) (70-30) 100 UNIT/ML KwikPen 40 units before breakfast and 20 before dinner   Insulin Pen Needle 31G X 5 MM MISC Used to inject insulin   Insulin Syringe-Needle U-100 (INSULIN SYRINGE 1CC/30GX5/16") 30G X 5/16" 1 ML MISC 1 Syringe by Does not apply route daily. Use to inject insulin daily   losartan (COZAAR) 100 MG tablet Take 1 tablet (100 mg total) by mouth daily.   metFORMIN (GLUCOPHAGE-XR) 500 MG 24 hr tablet Take 4 tablets (2,000 mg total) by mouth daily.   tizanidine (ZANAFLEX) 2 MG capsule Take 1 capsule (2 mg total) by mouth 3 (three) times daily.   No facility-administered medications prior to  visit.    Review of Systems See HPI    Last CBC Lab Results  Component Value Date   WBC 9.2 02/26/2023   HGB 16.4 02/26/2023   HCT 47.4 02/26/2023   MCV 89 02/26/2023   MCH 30.8 02/26/2023   RDW 12.9 02/26/2023   PLT 256 02/26/2023   Last metabolic panel Lab Results  Component Value Date   GLUCOSE 158 (H) 02/26/2023   NA 142 02/26/2023   K 4.6 02/26/2023   CL 101 02/26/2023   CO2 23 02/26/2023   BUN 15 02/26/2023   CREATININE 0.73 (L) 02/26/2023   EGFR 101 02/26/2023   CALCIUM 9.8 02/26/2023   PROT 7.4 02/26/2023   ALBUMIN 4.6 02/26/2023   LABGLOB 2.8 02/26/2023   AGRATIO 1.6 02/26/2023   BILITOT 0.5 02/26/2023   ALKPHOS 90 02/26/2023   AST 20 02/26/2023   ALT 27 02/26/2023   Last lipids Lab Results  Component Value Date   CHOL 191 02/26/2023   HDL 46 02/26/2023   LDLCALC 128 (H) 02/26/2023   LDLDIRECT 162.0 11/13/2022   TRIG 93 02/26/2023   CHOLHDL 4.2 02/26/2023   Last hemoglobin A1c Lab Results  Component Value Date   HGBA1C 11.6 (H) 02/26/2023   Last thyroid functions Lab Results  Component Value Date   TSH 2.490 02/26/2023       Objective     Today's Vitals   03/26/23 1003 03/26/23 1021  BP: (Abnormal) 145/78 126/73  Pulse: 62   SpO2: 99%   Weight: 163 lb 6.4 oz (74.1 kg)   Height: 5\' 7"  (1.702 m)    Body mass index is 25.59 kg/m.  BP Readings from Last 3 Encounters:  03/26/23 126/73  03/05/23 (Abnormal) 167/98  01/18/23 110/80    Wt Readings from Last 3 Encounters:  03/26/23 163 lb 6.4 oz (74.1 kg)  03/05/23 171 lb 12.8 oz (77.9 kg)  01/18/23 166 lb (75.3 kg)    Physical Exam Vitals and nursing note reviewed.  Constitutional:      Appearance: Normal appearance. He is well-developed.  HENT:     Head: Normocephalic and atraumatic.     Nose: Nose normal.     Mouth/Throat:     Mouth: Mucous membranes are moist.     Pharynx: Oropharynx is clear.  Eyes:     Extraocular Movements: Extraocular movements intact.      Conjunctiva/sclera: Conjunctivae normal.     Pupils: Pupils are equal, round, and reactive to light.  Neck:     Vascular: No carotid bruit.  Cardiovascular:     Rate and Rhythm: Normal rate and regular rhythm.     Pulses: Normal pulses.     Heart sounds: Normal heart sounds.  Pulmonary:     Effort: Pulmonary effort is normal.     Breath sounds: Normal breath sounds.  Abdominal:     Palpations: Abdomen is soft.  Musculoskeletal:        General: Normal range of motion.     Cervical back: Normal range of motion and neck supple.  Lymphadenopathy:     Cervical: No cervical adenopathy.  Skin:    General: Skin is warm and dry.     Capillary Refill: Capillary refill takes less than 2 seconds.  Neurological:     General: No focal deficit present.     Mental Status: He is alert and oriented to person, place, and time.  Psychiatric:        Mood and Affect: Mood normal.        Behavior: Behavior normal.        Thought Content: Thought content normal.        Judgment: Judgment normal.      Assessment & Plan    Hypertension associated with type 2 diabetes mellitus (HCC) Assessment & Plan: Improved and stable.  -continue higher dose losartan as prescribed    Mixed diabetic hyperlipidemia associated with type 2 diabetes mellitus (HCC) Assessment & Plan: Continue with lipid lowering medications as prescribed.    Poorly controlled diabetes mellitus (HCC)- mgt per Endo; Dr Everardo All Assessment & Plan: HgbA1c slightly improved at 11.6, down from 12.3 at last check. Continue regullar visits with endocrinology and medication regimen as prescribed.       Return in about 3 months (around 06/26/2023) for blood pressure, overdue for MWV .         Carlean Jews, NP  [facility] [phone] (phone) [phone] (fax)  [facility]

## 2023-03-26 NOTE — Assessment & Plan Note (Signed)
Continue with lipid lowering medications as prescribed.

## 2023-03-28 DIAGNOSIS — S46012D Strain of muscle(s) and tendon(s) of the rotator cuff of left shoulder, subsequent encounter: Secondary | ICD-10-CM | POA: Diagnosis not present

## 2023-04-03 ENCOUNTER — Ambulatory Visit (INDEPENDENT_AMBULATORY_CARE_PROVIDER_SITE_OTHER): Payer: Medicare HMO | Admitting: Nurse Practitioner

## 2023-04-03 ENCOUNTER — Other Ambulatory Visit: Payer: Self-pay | Admitting: Endocrinology

## 2023-04-03 ENCOUNTER — Encounter: Payer: Self-pay | Admitting: Nurse Practitioner

## 2023-04-03 VITALS — BP 136/73 | HR 66 | Ht 67.0 in | Wt 162.4 lb

## 2023-04-03 DIAGNOSIS — R9431 Abnormal electrocardiogram [ECG] [EKG]: Secondary | ICD-10-CM

## 2023-04-03 DIAGNOSIS — Z0000 Encounter for general adult medical examination without abnormal findings: Secondary | ICD-10-CM | POA: Diagnosis not present

## 2023-04-03 DIAGNOSIS — E1165 Type 2 diabetes mellitus with hyperglycemia: Secondary | ICD-10-CM

## 2023-04-03 DIAGNOSIS — I152 Hypertension secondary to endocrine disorders: Secondary | ICD-10-CM | POA: Diagnosis not present

## 2023-04-03 DIAGNOSIS — E1169 Type 2 diabetes mellitus with other specified complication: Secondary | ICD-10-CM | POA: Diagnosis not present

## 2023-04-03 DIAGNOSIS — E782 Mixed hyperlipidemia: Secondary | ICD-10-CM | POA: Diagnosis not present

## 2023-04-03 DIAGNOSIS — Z794 Long term (current) use of insulin: Secondary | ICD-10-CM | POA: Diagnosis not present

## 2023-04-03 DIAGNOSIS — E1159 Type 2 diabetes mellitus with other circulatory complications: Secondary | ICD-10-CM

## 2023-04-03 NOTE — Progress Notes (Signed)
Subjective:   Tyler Wolf is a 67 y.o. male who presents for a Welcome to Medicare exam.   Patient Medicare AWV questionnaire was completed by the patient on 04/03/2023; I have confirmed that all information answered by patient is correct and no changes since this date.  Review of Systems: -hypertension  -uncontrolled tye 2 diabetes  --most recent HgbA1c done 1 month ago 11.6  ---sees endocrinology  Cardiac Risk Factors include: diabetes mellitus     Objective:    Today's Vitals   04/03/23 1337 04/03/23 1346  BP: (Abnormal) 145/77 136/73  Pulse: 66   SpO2: 99%   Weight: 162 lb 6.4 oz (73.7 kg)   Height: 5\' 7"  (1.702 m)   PainSc: 0-No pain 0-No pain   Body mass index is 25.44 kg/m.  Medications Outpatient Encounter Medications as of 04/03/2023  Medication Sig   aspirin 81 MG tablet Take 81 mg by mouth daily.   atorvastatin (LIPITOR) 40 MG tablet Take 1 tablet (40 mg total) by mouth daily.   bacitracin 500 UNIT/GM ointment Apply 1 application topically 2 (two) times daily.   cholecalciferol (VITAMIN D3) 25 MCG (1000 UNIT) tablet Take 2,000 Units by mouth daily.   cyclobenzaprine (FLEXERIL) 10 MG tablet Take 1 tablet (10 mg total) by mouth 2 (two) times daily as needed for muscle spasms.   glucose blood (CONTOUR NEXT TEST) test strip 1 each by Other route 2 (two) times daily. And lancets 2/day   insulin isophane & regular human KwikPen (HUMULIN 70/30 KWIKPEN) (70-30) 100 UNIT/ML KwikPen INJECT 40 UNITS BEFORE BREAKFAST AND 20 UNITS BEFORE DINNER   Insulin Pen Needle 31G X 5 MM MISC Used to inject insulin   Insulin Syringe-Needle U-100 (INSULIN SYRINGE 1CC/30GX5/16") 30G X 5/16" 1 ML MISC 1 Syringe by Does not apply route daily. Use to inject insulin daily   losartan (COZAAR) 100 MG tablet Take 1 tablet (100 mg total) by mouth daily.   metFORMIN (GLUCOPHAGE-XR) 500 MG 24 hr tablet Take 4 tablets (2,000 mg total) by mouth daily.   tizanidine (ZANAFLEX) 2 MG capsule Take 1  capsule (2 mg total) by mouth 3 (three) times daily.   No facility-administered encounter medications on file as of 04/03/2023.     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   HYPERLIPIDEMIA, MIXED 05/27/2007   HYPERTENSION 05/17/2007   TOBACCO ABUSE 05/27/2007   Past Surgical History:  Procedure Laterality Date   COLONOSCOPY      Family History  Problem Relation Age of Onset   Diabetes Mother    Heart attack Father    Diabetes Father    Colon polyps Neg Hx    Colon cancer Neg Hx    Esophageal cancer Neg Hx    Social History   Occupational History   Not on file  Tobacco Use   Smoking status: Every Day    Packs/day: 0.55    Years: 51.00    Additional pack years: 0.00    Total pack years: 28.05    Types: Cigarettes   Smokeless tobacco: Never   Tobacco comments:    electronic cigarettes, 6/1/2020pt states on and off  Vaping Use   Vaping Use: Never used  Substance and Sexual Activity   Alcohol use: No    Alcohol/week: 0.0 standard drinks of alcohol   Drug use: No   Sexual activity: Not  Currently    Birth control/protection: None    Tobacco Counseling Ready to quit: Yes Counseling given: Yes Tobacco comments: electronic cigarettes, 6/1/2020pt states on and off   Immunizations and Health Maintenance Immunization History  Administered Date(s) Administered   Influenza Split 08/10/2011, 08/12/2012, 08/13/2013, 06/11/2014, 06/26/2016, 07/03/2017, 07/09/2018   Influenza Whole 10/16/2001, 07/23/2008, 07/30/2009, 08/02/2010   Influenza,inj,Quad PF,6+ Mos 08/13/2013, 06/11/2014, 06/26/2016, 07/03/2017, 07/09/2018, 06/26/2019, 07/12/2021   Influenza-Unspecified 08/13/2013, 06/11/2014, 06/26/2016, 07/03/2017, 07/09/2018   PFIZER(Purple Top)SARS-COV-2 Vaccination 01/08/2020, 02/02/2020   Pneumococcal Polysaccharide-23 10/18/2012   Td  10/16/1998, 07/30/2009   Td (Adult),5 Lf Tetanus Toxid, Preservative Free 10/16/1998, 07/30/2009   Tdap 10/16/1998, 07/30/2009, 02/12/2020   Zoster Recombinant(Shingrix) 08/27/2018, 11/27/2018   Health Maintenance Due  Topic Date Due   HIV Screening  Never done   Hepatitis C Screening  Never done   Pneumonia Vaccine 4+ Years old (2 of 2 - PCV) 10/18/2013   OPHTHALMOLOGY EXAM  11/09/2021   COVID-19 Vaccine (3 - 2023-24 season) 06/16/2022    Activities of Daily Living   Row Labels 04/03/2023    1:48 PM  In your present state of health, do you have any difficulty performing the following activities:   Section Header. No data exists in this row.   Hearing?   1  Vision?   0  Difficulty concentrating or making decisions?   0  Walking or climbing stairs?   0  Dressing or bathing?   0  Doing errands, shopping?   0  Preparing Food and eating ?   N  Using the Toilet?   N  In the past six months, have you accidently leaked urine?   N  Do you have problems with loss of bowel control?   N  Managing your Medications?   N  Managing your Finances?   N  Housekeeping or managing your Housekeeping?   N    Physical Exam   Physical Exam Vitals and nursing note reviewed.  Constitutional:      Appearance: Normal appearance. He is well-developed.  HENT:     Head: Normocephalic and atraumatic.     Nose: Nose normal.     Mouth/Throat:     Mouth: Mucous membranes are moist.     Pharynx: Oropharynx is clear.  Eyes:     Extraocular Movements: Extraocular movements intact.     Conjunctiva/sclera: Conjunctivae normal.     Pupils: Pupils are equal, round, and reactive to light.  Cardiovascular:     Rate and Rhythm: Normal rate and regular rhythm.     Pulses: Normal pulses.     Heart sounds: Normal heart sounds.     Comments: EKG showing normal sinus rhythm with left anterior fascicular block.  Pulmonary:     Effort: Pulmonary effort is normal.     Breath sounds: Normal breath sounds.   Abdominal:     General: Bowel sounds are normal. There is no distension.     Palpations: Abdomen is soft. There is no mass.     Tenderness: There is no abdominal tenderness. There is no right CVA tenderness, left CVA tenderness, guarding or rebound.     Hernia: No hernia is present.  Musculoskeletal:        General: Normal range of motion.     Cervical back: Normal range of motion and neck supple.  Lymphadenopathy:     Cervical: No cervical adenopathy.  Skin:    General: Skin is warm and dry.     Capillary Refill: Capillary refill  takes less than 2 seconds.  Neurological:     General: No focal deficit present.     Mental Status: He is alert and oriented to person, place, and time.  Psychiatric:        Mood and Affect: Mood normal.        Behavior: Behavior normal.        Thought Content: Thought content normal.        Judgment: Judgment normal.     (optional), or other factors deemed appropriate based on the beneficiary's medical and social history and current clinical standards.  Advanced Directives: Does Patient Have a Medical Advance Directive?: Yes Type of Advance Directive: Living will Does patient want to make changes to medical advance directive?: No - Patient declined    Assessment:   Encounter for Medicare annual wellness exam -     EKG 12-Lead  Type 2 diabetes mellitus with other specified complication, with long-term current use of insulin (HCC)  Hypertension associated with type 2 diabetes mellitus (HCC) Assessment & Plan: Improved and stable.  -continue higher dose losartan as prescribed    Mixed diabetic hyperlipidemia associated with type 2 diabetes mellitus (HCC) Assessment & Plan: Continue with lipid lowering medications as prescribed.    Abnormal EKG Assessment & Plan: EKG done during today's visit  - showing normal sinus rhythm with left anterior fascicular block.  -not causing symptoms at this time.  -will monitor.   Poorly controlled  diabetes mellitus (HCC)- mgt per Endo; Dr Everardo All Assessment & Plan: HgbA1c slightly improved at 11.6, down from 12.3 at last check. Continue regullar visits with endocrinology and medication regimen as prescribed.       Vision/Hearing screen Vision Screening   Right eye Left eye Both eyes  Without correction 20/20 20/20 20/20   With correction         Depression Screen   Row Labels 04/03/2023    1:55 PM 03/26/2023   10:04 AM 03/05/2023   11:18 AM 12/27/2022    2:57 PM  PHQ 2/9 Scores   Section Header. No data exists in this row.      PHQ - 2 Score   0 0 0 0  PHQ- 9 Score   0 0 0 1     Fall Risk   Row Labels 04/03/2023    1:59 PM  Fall Risk    Section Header. No data exists in this row.   Falls in the past year?   0  Number falls in past yr:   0  Injury with Fall?   0  Risk for fall due to :   No Fall Risks  Follow up   Falls evaluation completed    MEDICARE RISK AT HOME:   TIMED UP AND GO: Was the test performed? Yes  Length of time to ambulate 10 feet: 10 sec Gait steady and fast without use of assistive device    Cognitive Function       Row Labels 04/03/2023    1:59 PM  6CIT Screen   Section Header. No data exists in this row.   What Year?   0 points  What month?   0 points  What time?   0 points  Count back from 20   0 points  Months in reverse   0 points  Repeat phrase   0 points  Total Score   0 points    Patient Care Team: Carlean Jews, NP as PCP - General (Family Medicine)  Diabetic  Foot Exam: Diabetic Foot Exam: Completed 2024  Community Resource Referral / Chronic Care Management: CRR required this visit?  No   CCM needed this visit?  No     Plan:     I have personally reviewed and noted the following in the patient's chart:   Medical and social history Use of alcohol, tobacco or illicit drugs  Current medications and supplements Functional ability and status Nutritional status Physical activity Advanced directives List of  other physicians Hospitalizations, surgeries, and ER visits in previous 12 months Vitals Screenings to include cognitive, depression, and falls Referrals and appointments  In addition, I have reviewed and discussed with patient certain preventive protocols, quality metrics, and best practice recommendations. A written personalized care plan for preventive services as well as general preventive health recommendations were provided to patient.     04/15/2023  After Visit Summary: given to the patient.

## 2023-04-04 DIAGNOSIS — S46012D Strain of muscle(s) and tendon(s) of the rotator cuff of left shoulder, subsequent encounter: Secondary | ICD-10-CM | POA: Diagnosis not present

## 2023-04-05 DIAGNOSIS — S46012D Strain of muscle(s) and tendon(s) of the rotator cuff of left shoulder, subsequent encounter: Secondary | ICD-10-CM | POA: Diagnosis not present

## 2023-04-06 DIAGNOSIS — S46012D Strain of muscle(s) and tendon(s) of the rotator cuff of left shoulder, subsequent encounter: Secondary | ICD-10-CM | POA: Diagnosis not present

## 2023-04-12 DIAGNOSIS — S46012D Strain of muscle(s) and tendon(s) of the rotator cuff of left shoulder, subsequent encounter: Secondary | ICD-10-CM | POA: Diagnosis not present

## 2023-04-14 DIAGNOSIS — M25512 Pain in left shoulder: Secondary | ICD-10-CM | POA: Diagnosis not present

## 2023-04-15 DIAGNOSIS — R9431 Abnormal electrocardiogram [ECG] [EKG]: Secondary | ICD-10-CM | POA: Insufficient documentation

## 2023-04-15 NOTE — Assessment & Plan Note (Signed)
EKG done during today's visit  - showing normal sinus rhythm with left anterior fascicular block.  -not causing symptoms at this time.  -will monitor.

## 2023-04-15 NOTE — Assessment & Plan Note (Signed)
Improved and stable.  -continue higher dose losartan as prescribed  

## 2023-04-15 NOTE — Assessment & Plan Note (Signed)
Continue with lipid lowering medications as prescribed.  

## 2023-04-15 NOTE — Assessment & Plan Note (Signed)
HgbA1c slightly improved at 11.6, down from 12.3 at last check. Continue regullar visits with endocrinology and medication regimen as prescribed.  

## 2023-04-17 DIAGNOSIS — S46012D Strain of muscle(s) and tendon(s) of the rotator cuff of left shoulder, subsequent encounter: Secondary | ICD-10-CM | POA: Diagnosis not present

## 2023-04-24 DIAGNOSIS — S46012D Strain of muscle(s) and tendon(s) of the rotator cuff of left shoulder, subsequent encounter: Secondary | ICD-10-CM | POA: Diagnosis not present

## 2023-04-27 ENCOUNTER — Other Ambulatory Visit: Payer: Self-pay | Admitting: Orthopaedic Surgery

## 2023-04-27 DIAGNOSIS — S46012D Strain of muscle(s) and tendon(s) of the rotator cuff of left shoulder, subsequent encounter: Secondary | ICD-10-CM | POA: Diagnosis not present

## 2023-04-27 DIAGNOSIS — Z01818 Encounter for other preprocedural examination: Secondary | ICD-10-CM

## 2023-05-01 ENCOUNTER — Telehealth: Payer: Self-pay

## 2023-05-01 NOTE — Telephone Encounter (Signed)
Patient dropped off document Surgical Clearance, to be filled out by provider. Patient requested to send it back via Fax within 7-days. Document is located in providers tray at front office.Please advise at Mobile [phone] (mobile)  Pt was last seen 03/26/23 OV with ROV 06/26/23 would he need another appt for you to complete clearance?

## 2023-05-07 ENCOUNTER — Ambulatory Visit: Payer: Medicare HMO | Admitting: "Endocrinology

## 2023-05-16 ENCOUNTER — Encounter: Payer: Self-pay | Admitting: "Endocrinology

## 2023-05-16 ENCOUNTER — Ambulatory Visit: Payer: Medicare HMO | Admitting: "Endocrinology

## 2023-05-16 VITALS — BP 130/80 | HR 101 | Ht 67.0 in | Wt 165.4 lb

## 2023-05-16 DIAGNOSIS — Z794 Long term (current) use of insulin: Secondary | ICD-10-CM | POA: Diagnosis not present

## 2023-05-16 DIAGNOSIS — Z7984 Long term (current) use of oral hypoglycemic drugs: Secondary | ICD-10-CM | POA: Diagnosis not present

## 2023-05-16 DIAGNOSIS — E7800 Pure hypercholesterolemia, unspecified: Secondary | ICD-10-CM | POA: Diagnosis not present

## 2023-05-16 DIAGNOSIS — E1165 Type 2 diabetes mellitus with hyperglycemia: Secondary | ICD-10-CM

## 2023-05-16 MED ORDER — ATORVASTATIN CALCIUM 40 MG PO TABS
40.0000 mg | ORAL_TABLET | Freq: Every day | ORAL | 1 refills | Status: DC
Start: 2023-05-16 — End: 2023-06-26

## 2023-05-16 MED ORDER — DEXCOM G7 SENSOR MISC
1.0000 | 0 refills | Status: DC
Start: 2023-05-16 — End: 2023-08-31

## 2023-05-16 MED ORDER — DEXCOM G7 RECEIVER DEVI
1.0000 | 0 refills | Status: DC
Start: 2023-05-16 — End: 2024-01-23

## 2023-05-16 NOTE — Progress Notes (Signed)
67-year-old with grossly right now okay.  At home, will leave soon this Friday, August 2   Outpatient Endocrinology Note Tyler Valmy, MD  05/16/23   Tyler Wolf 06/23/1956 [id number]  Referring Provider: Carlean Jews, NP Primary Care Provider: Sandre Kitty, MD Reason for consultation: Subjective   Assessment & Plan  Diagnoses and all orders for this visit:  Uncontrolled type 2 diabetes mellitus with hyperglycemia, with long-term current use of insulin (HCC) -     atorvastatin (LIPITOR) 40 MG tablet; Take 1 tablet (40 mg total) by mouth daily. -     Continuous Glucose Receiver (DEXCOM G7 RECEIVER) DEVI; 1 Device by Does not apply route continuous. -     Continuous Glucose Sensor (DEXCOM G7 SENSOR) MISC; 1 Device by Does not apply route continuous.  Long term (current) use of oral hypoglycemic drugs  Pure hypercholesterolemia    Diabetes Type II complicated by hyperglycemia Lab Results  Component Value Date   GFR 90.96 01/15/2023   Hba1c goal less than 7, current Hba1c is  Lab Results  Component Value Date   HGBA1C 11.6 (H) 02/26/2023   Will recommend the following: Humulin 70/30 40 units before breakfast and 25 minutes before supper, encouraged compliance and taking 15 to 30 minutes before meals Continue metformin XR 500 mg 4 tablets a day Encourage compliance with Dexcom, reordered all the supplies as patient seemingly lost them  No known contraindications to any of above medications  -Last LD and Tg are as follows: Lab Results  Component Value Date   LDLCALC 128 (H) 02/26/2023    Lab Results  Component Value Date   TRIG 93 02/26/2023   -ran out of atorvastatin 40 mg every day, ordered refill and encouraged compliance -Follow low fat diet and exercise   -Blood pressure goal <140/90 - Microalbumin/creatinine goal is < 30 -Last MA/Cr is as follows: Lab Results  Component Value Date   MICROALBUR 2.2 (H) 11/13/2022   - on ACE/ARB losartan 100  mg qd -diet changes including salt restriction -limit eating outside -counseled BP targets per standards of diabetes care -uncontrolled blood pressure can lead to retinopathy, nephropathy and cardiovascular and atherosclerotic heart disease  Reviewed and counseled on: -A1C target -Blood sugar targets -Complications of uncontrolled diabetes  -Checking blood sugar before meals and bedtime and bring log next visit -All medications with mechanism of action and side effects -Hypoglycemia management: rule of 15's, Glucagon Emergency Kit and medical alert ID -low-carb low-fat plate-method diet -At least 20 minutes of physical activity per day -Annual dilated retinal eye exam and foot exam -compliance and follow up needs -follow up as scheduled or earlier if problem gets worse  Call if blood sugar is less than 70 or consistently above 250    Take a 15 gm snack of carbohydrate at bedtime before you go to sleep if your blood sugar is less than 100.    If you are going to fast after midnight for a test or procedure, ask your physician for instructions on how to reduce/decrease your insulin dose.    Call if blood sugar is less than 70 or consistently above 250  -Treating a low sugar by rule of 15  (15 gms of sugar every 15 min until sugar is more than 70) If you feel your sugar is low, test your sugar to be sure If your sugar is low (less than 70), then take 15 grams of a fast acting Carbohydrate (3-4 glucose tablets or glucose gel or  4 ounces of juice or regular soda) Recheck your sugar 15 min after treating low to make sure it is more than 70 If sugar is still less than 70, treat again with 15 grams of carbohydrate          Don't drive the hour of hypoglycemia  If unconscious/unable to eat or drink by mouth, use glucagon injection or nasal spray baqsimi and call 911. Can repeat again in 15 min if still unconscious.  Return in about 2 months (around 07/30/2023).   I have reviewed current  medications, nurse's notes, allergies, vital signs, past medical and surgical history, family medical history, and social history for this encounter. Counseled patient on symptoms, examination findings, lab findings, imaging results, treatment decisions and monitoring and prognosis. The patient understood the recommendations and agrees with the treatment plan. All questions regarding treatment plan were fully answered.  Tyler [place], MD  05/16/23    History of Present Illness Tyler Wolf is a 67 y.o. year old male who presents for evaluation of Type II diabetes mellitus.  Tyler Wolf was first diagnosed in 2009. Diabetes education +  Home diabetes regimen: Humulin 70/30 40 units before breakfast and 25 minutes before supper-poor compliance Metformin XR 500 mg 4 tablets a day  BLOOD SUGAR DATA Did not bring meter, checks some days Usually runs in 200s Encouraged compliance  Physical Exam  BP 130/80   Pulse (!) 101   Ht 5\' 7"  (1.702 m)   Wt 165 lb 6.4 oz (75 kg)   SpO2 99%   BMI 25.91 kg/m    Constitutional: well developed, well nourished Head: normocephalic, atraumatic Eyes: sclera anicteric, no redness Neck: supple Lungs: normal respiratory effort Neurology: alert and oriented Skin: dry, no appreciable rashes Musculoskeletal: no appreciable defects Psychiatric: normal mood and affect Diabetic Foot Exam - Simple   No data filed      Current Medications Patient's Medications  New Prescriptions   CONTINUOUS GLUCOSE RECEIVER (DEXCOM G7 RECEIVER) DEVI    1 Device by Does not apply route continuous.   CONTINUOUS GLUCOSE SENSOR (DEXCOM G7 SENSOR) MISC    1 Device by Does not apply route continuous.  Previous Medications   ASPIRIN 81 MG TABLET    Take 81 mg by mouth daily.   BACITRACIN 500 UNIT/GM OINTMENT    Apply 1 application topically 2 (two) times daily.   CHOLECALCIFEROL (VITAMIN D3) 25 MCG (1000 UNIT) TABLET    Take 2,000 Units by mouth daily.    CYCLOBENZAPRINE (FLEXERIL) 10 MG TABLET    Take 1 tablet (10 mg total) by mouth 2 (two) times daily as needed for muscle spasms.   GLUCOSE BLOOD (CONTOUR NEXT TEST) TEST STRIP    1 each by Other route 2 (two) times daily. And lancets 2/day   INSULIN ISOPHANE & REGULAR HUMAN KWIKPEN (HUMULIN 70/30 KWIKPEN) (70-30) 100 UNIT/ML KWIKPEN    INJECT 40 UNITS BEFORE BREAKFAST AND 20 UNITS BEFORE DINNER   INSULIN PEN NEEDLE 31G X 5 MM MISC    Used to inject insulin   INSULIN SYRINGE-NEEDLE U-100 (INSULIN SYRINGE 1CC/30GX5/16") 30G X 5/16" 1 ML MISC    1 Syringe by Does not apply route daily. Use to inject insulin daily   LOSARTAN (COZAAR) 100 MG TABLET    Take 1 tablet (100 mg total) by mouth daily.   METFORMIN (GLUCOPHAGE-XR) 500 MG 24 HR TABLET    Take 4 tablets (2,000 mg total) by mouth daily.   TIZANIDINE (ZANAFLEX) 2 MG  CAPSULE    Take 1 capsule (2 mg total) by mouth 3 (three) times daily.  Modified Medications   Modified Medication Previous Medication   ATORVASTATIN (LIPITOR) 40 MG TABLET atorvastatin (LIPITOR) 40 MG tablet      Take 1 tablet (40 mg total) by mouth daily.    Take 1 tablet (40 mg total) by mouth daily.  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  Social History Social History   Socioeconomic History   Marital status: Married    Spouse name: Not on file   Number of children: Not on file   Years of education: Not on file    Highest education level: Not on file  Occupational History   Not on file  Tobacco Use   Smoking status: Every Day    Current packs/day: 0.55    Average packs/day: 0.6 packs/day for 51.0 years (28.1 ttl pk-yrs)    Types: Cigarettes   Smokeless tobacco: Never   Tobacco comments:    electronic cigarettes, 6/1/2020pt states on and off  Vaping Use   Vaping status: Never Used  Substance and Sexual Activity   Alcohol use: No    Alcohol/week: 0.0 standard drinks of alcohol   Drug use: No   Sexual activity: Not Currently    Birth control/protection: None  Other Topics Concern   Not on file  Social History Narrative   Not on file   Social Determinants of Health   Financial Resource Strain: Low Risk  (04/03/2023)   Overall Financial Resource Strain (CARDIA)    Difficulty of Paying Living Expenses: Not hard at all  Food Insecurity: No Food Insecurity (04/03/2023)   Hunger Vital Sign    Worried About Running Out of Food in the Last Year: Never true    Ran Out of Food in the Last Year: Never true  Transportation Needs: No Transportation Needs (04/03/2023)   PRAPARE - Administrator, Civil Service (Medical): No    Lack of Transportation (Non-Medical): No  Physical Activity: Sufficiently Active (04/03/2023)   Exercise Vital Sign    Days of Exercise per Week: 2 days    Minutes of Exercise per Session: 110 min  Stress: No Stress Concern Present (04/03/2023)   [employer] of Occupational Health - Occupational Stress Questionnaire    Feeling of Stress : Not at all  Social Connections: Moderately Isolated (04/03/2023)   Social Connection and Isolation Panel [NHANES]    Frequency of Communication with Friends and Family: Once a week    Frequency of Social Gatherings with Friends and Family: Twice a week    Attends Religious Services: Never    Database administrator or Organizations: No    Attends Banker Meetings: Never    Marital Status: Married  Careers information officer Violence: Not At Risk (04/03/2023)   Humiliation, Afraid, Rape, and Kick questionnaire    Fear of  Current or Ex-Partner: No    Emotionally Abused: No    Physically Abused: No    Sexually Abused: No    Lab Results  Component Value Date   HGBA1C 11.6 (H) 02/26/2023   HGBA1C 11.9 12/27/2022   HGBA1C 12.8 (H) 11/13/2022   Lab Results  Component Value Date   CHOL 191 02/26/2023   Lab Results  Component Value Date   HDL 46 02/26/2023   Lab Results  Component Value Date   LDLCALC 128 (H) 02/26/2023   Lab Results  Component Value Date   TRIG 93 02/26/2023   Lab Results  Component Value Date   CHOLHDL 4.2 02/26/2023   Lab Results  Component Value Date   CREATININE 0.73 (L) 02/26/2023   Lab Results  Component Value Date   GFR 90.96 01/15/2023   Lab Results  Component Value Date   MICROALBUR 2.2 (H) 11/13/2022      Component Value Date/Time   NA 142 02/26/2023 0935   K 4.6 02/26/2023 0935   CL 101 02/26/2023 0935   CO2 23 02/26/2023 0935   GLUCOSE 158 (H) 02/26/2023 0935   GLUCOSE 313 (H) 01/15/2023 0826   GLUCOSE 94 09/10/2006 1325   BUN 15 02/26/2023 0935   CREATININE 0.73 (L) 02/26/2023 0935   CALCIUM 9.8 02/26/2023 0935   PROT 7.4 02/26/2023 0935   ALBUMIN 4.6 02/26/2023 0935   AST 20 02/26/2023 0935   ALT 27 02/26/2023 0935   ALKPHOS 90 02/26/2023 0935   BILITOT 0.5 02/26/2023 0935   GFRNONAA 93 02/12/2020 0939   GFRAA 107 02/12/2020 0939      Latest Ref Rng & Units 02/26/2023    9:35 AM 01/15/2023    8:26 AM 11/13/2022    9:14 AM  BMP  Glucose 70 - 99 mg/dL 960  454  098   BUN 8 - 27 mg/dL 15  20  17    Creatinine 0.76 - 1.27 mg/dL 1.19  1.47  8.29   BUN/Creat Ratio 10 - 24 21     Sodium 134 - 144 mmol/L 142  132  134   Potassium 3.5 - 5.2 mmol/L 4.6  4.0  4.6   Chloride 96 - 106 mmol/L 101  98  98   CO2 20 - 29 mmol/L 23  29  28    Calcium 8.6 - 10.2 mg/dL 9.8  9.7  9.1        Component Value Date/Time   WBC 9.2 02/26/2023 0935   WBC  6.8 07/01/2018 0906   RBC 5.32 02/26/2023 0935   RBC 4.99 07/01/2018 0906   HGB 16.4 02/26/2023 0935   HCT 47.4 02/26/2023 0935   PLT 256 02/26/2023 0935   MCV 89 02/26/2023 0935   MCH 30.8 02/26/2023 0935   MCHC 34.6 02/26/2023 0935   MCHC 34.7 07/01/2018 0906   RDW 12.9 02/26/2023 0935   LYMPHSABS 1.4 02/26/2023 0935   MONOABS 0.6 07/01/2018 0906   EOSABS 0.2 02/26/2023 0935   BASOSABS 0.0 02/26/2023 0935     Parts of this note may have been dictated using voice recognition software. There may be variances in spelling and vocabulary which are unintentional. Not all errors are proofread. Please notify the Thereasa Parkin if any discrepancies are noted or if the meaning of any statement is not clear.

## 2023-05-16 NOTE — Patient Instructions (Signed)

## 2023-05-17 ENCOUNTER — Ambulatory Visit
Admission: RE | Admit: 2023-05-17 | Discharge: 2023-05-17 | Disposition: A | Discharge status: Home / Self Care | Payer: Medicare HMO | Source: Ambulatory Visit | Attending: Orthopaedic Surgery | Admitting: Orthopaedic Surgery

## 2023-05-17 DIAGNOSIS — Z01818 Encounter for other preprocedural examination: Secondary | ICD-10-CM

## 2023-05-17 DIAGNOSIS — M25512 Pain in left shoulder: Secondary | ICD-10-CM | POA: Diagnosis not present

## 2023-05-17 DIAGNOSIS — M19012 Primary osteoarthritis, left shoulder: Secondary | ICD-10-CM | POA: Diagnosis not present

## 2023-05-17 DIAGNOSIS — G8929 Other chronic pain: Secondary | ICD-10-CM | POA: Diagnosis not present

## 2023-05-21 ENCOUNTER — Other Ambulatory Visit: Payer: Self-pay | Admitting: Family Medicine

## 2023-05-21 DIAGNOSIS — I444 Left anterior fascicular block: Secondary | ICD-10-CM

## 2023-05-21 DIAGNOSIS — E1165 Type 2 diabetes mellitus with hyperglycemia: Secondary | ICD-10-CM

## 2023-05-21 NOTE — Progress Notes (Signed)
Labs for surgical clearance

## 2023-05-22 ENCOUNTER — Ambulatory Visit: Payer: Medicare HMO | Admitting: Family Medicine

## 2023-05-22 VITALS — BP 136/85 | HR 64 | Resp 18 | Ht 67.0 in | Wt 165.0 lb

## 2023-05-22 DIAGNOSIS — Z794 Long term (current) use of insulin: Secondary | ICD-10-CM | POA: Diagnosis not present

## 2023-05-22 DIAGNOSIS — E1169 Type 2 diabetes mellitus with other specified complication: Secondary | ICD-10-CM

## 2023-05-22 DIAGNOSIS — I444 Left anterior fascicular block: Secondary | ICD-10-CM

## 2023-05-22 LAB — POCT GLYCOSYLATED HEMOGLOBIN (HGB A1C): HbA1c POC (<> result, manual entry): 13.4 % (ref 4.0–5.6)

## 2023-05-22 NOTE — Progress Notes (Signed)
Patient presented for EKG and A1C for surgical clearance. Both have been resulted and provider notified.

## 2023-06-08 ENCOUNTER — Encounter: Payer: Self-pay | Admitting: "Endocrinology

## 2023-06-08 ENCOUNTER — Ambulatory Visit: Payer: Medicare HMO | Admitting: "Endocrinology

## 2023-06-08 VITALS — BP 150/90 | HR 100 | Ht 67.0 in | Wt 174.4 lb

## 2023-06-08 DIAGNOSIS — E7800 Pure hypercholesterolemia, unspecified: Secondary | ICD-10-CM

## 2023-06-08 DIAGNOSIS — Z794 Long term (current) use of insulin: Secondary | ICD-10-CM | POA: Diagnosis not present

## 2023-06-08 DIAGNOSIS — Z7984 Long term (current) use of oral hypoglycemic drugs: Secondary | ICD-10-CM

## 2023-06-08 DIAGNOSIS — E1165 Type 2 diabetes mellitus with hyperglycemia: Secondary | ICD-10-CM

## 2023-06-08 NOTE — Progress Notes (Signed)
Outpatient Endocrinology Note Altamese Wymore, MD  06/08/23   Tyler Wolf Aug 17, 1956 [id number]  Referring Provider: Sandre Kitty, MD Primary Care Provider: Sandre Kitty, MD Reason for consultation: Subjective   Assessment & Plan  Diagnoses and all orders for this visit:  Uncontrolled type 2 diabetes mellitus with hyperglycemia, with long-term current use of insulin (HCC)  Long term (current) use of oral hypoglycemic drugs  Pure hypercholesterolemia    Diabetes Type II complicated by hyperglycemia Lab Results  Component Value Date   GFR 90.96 01/15/2023   Hba1c goal less than 7, current Hba1c is  Lab Results  Component Value Date   HGBA1C 13.4 05/22/2023   Will recommend the following: Humulin 70/30 46 units before breakfast and 26 units before supper, encouraged compliance and taking 15 to 30 minutes before meals Continue metformin XR 500 mg 2 tablet twice a day Encourage compliance with Dexcom, reordered all the supplies as patient seemingly lost them. This time, did not follow with pharmacy  Asked pt to bring all meds next time to appointment to confirm compliance and involve wife and daughter in care for extra support with compliance   No known contraindications to any of above medications  -Last LD and Tg are as follows: Lab Results  Component Value Date   LDLCALC 128 (H) 02/26/2023    Lab Results  Component Value Date   TRIG 93 02/26/2023   -atorvastatin 40 mg every day -Follow low fat diet and exercise   -Blood pressure goal <140/90 - Microalbumin/creatinine goal is < 30 -Last MA/Cr is as follows: Lab Results  Component Value Date   MICROALBUR 2.2 (H) 11/13/2022   - on ACE/ARB losartan 100 mg qd -diet changes including salt restriction -limit eating outside -counseled BP targets per standards of diabetes care -uncontrolled blood pressure can lead to retinopathy, nephropathy and cardiovascular and atherosclerotic heart  disease  Reviewed and counseled on: -A1C target -Blood sugar targets -Complications of uncontrolled diabetes  -Checking blood sugar before meals and bedtime and bring log next visit -All medications with mechanism of action and side effects -Hypoglycemia management: rule of 15's, Glucagon Emergency Kit and medical alert ID -low-carb low-fat plate-method diet -At least 20 minutes of physical activity per day -Annual dilated retinal eye exam and foot exam -compliance and follow up needs -follow up as scheduled or earlier if problem gets worse  Call if blood sugar is less than 70 or consistently above 250    Take a 15 gm snack of carbohydrate at bedtime before you go to sleep if your blood sugar is less than 100.    If you are going to fast after midnight for a test or procedure, ask your physician for instructions on how to reduce/decrease your insulin dose.    Call if blood sugar is less than 70 or consistently above 250  -Treating a low sugar by rule of 15  (15 gms of sugar every 15 min until sugar is more than 70) If you feel your sugar is low, test your sugar to be sure If your sugar is low (less than 70), then take 15 grams of a fast acting Carbohydrate (3-4 glucose tablets or glucose gel or 4 ounces of juice or regular soda) Recheck your sugar 15 min after treating low to make sure it is more than 70 If sugar is still less than 70, treat again with 15 grams of carbohydrate          Don't drive the  hour of hypoglycemia  If unconscious/unable to eat or drink by mouth, use glucagon injection or nasal spray baqsimi and call 911. Can repeat again in 15 min if still unconscious.  Return in about 8 weeks (around 07/31/2023).   I have reviewed current medications, nurse's notes, allergies, vital signs, past medical and surgical history, family medical history, and social history for this encounter. Counseled patient on symptoms, examination findings, lab findings, imaging results,  treatment decisions and monitoring and prognosis. The patient understood the recommendations and agrees with the treatment plan. All questions regarding treatment plan were fully answered.  Altamese Cotesfield, MD  06/08/23    History of Present Illness Tyler Wolf is a 67 y.o. year old male who presents for evaluation of Type II diabetes mellitus.  Tyler Wolf was first diagnosed in 2009. Diabetes education +  Home diabetes regimen: Humulin 70/30 40 units before breakfast and 20 units before supper Metformin XR 500 mg 2 tablets bid  BLOOD SUGAR DATA Checks before meals/1 hr after 1-2 times a day 115-360  Physical Exam  BP (!) 150/90   Pulse 100   Ht 5\' 7"  (1.702 m)   Wt 174 lb 6.4 oz (79.1 kg)   SpO2 98%   BMI 27.31 kg/m    Constitutional: well developed, well nourished Head: normocephalic, atraumatic Eyes: sclera anicteric, no redness Neck: supple Lungs: normal respiratory effort Neurology: alert and oriented Skin: dry, no appreciable rashes Musculoskeletal: no appreciable defects Psychiatric: normal mood and affect Diabetic Foot Exam - Simple   No data filed      Current Medications Patient's Medications  New Prescriptions   No medications on file  Previous Medications   ASPIRIN 81 MG TABLET    Take 81 mg by mouth daily.   ATORVASTATIN (LIPITOR) 40 MG TABLET    Take 1 tablet (40 mg total) by mouth daily.   BACITRACIN 500 UNIT/GM OINTMENT    Apply 1 application topically 2 (two) times daily.   CHOLECALCIFEROL (VITAMIN D3) 25 MCG (1000 UNIT) TABLET    Take 2,000 Units by mouth daily.   CONTINUOUS GLUCOSE RECEIVER (DEXCOM G7 RECEIVER) DEVI    1 Device by Does not apply route continuous.   CONTINUOUS GLUCOSE SENSOR (DEXCOM G7 SENSOR) MISC    1 Device by Does not apply route continuous.   CYCLOBENZAPRINE (FLEXERIL) 10 MG TABLET    Take 1 tablet (10 mg total) by mouth 2 (two) times daily as needed for muscle spasms.   GLUCOSE BLOOD (CONTOUR NEXT TEST) TEST STRIP     1 each by Other route 2 (two) times daily. And lancets 2/day   INSULIN ISOPHANE & REGULAR HUMAN KWIKPEN (HUMULIN 70/30 KWIKPEN) (70-30) 100 UNIT/ML KWIKPEN    INJECT 40 UNITS BEFORE BREAKFAST AND 20 UNITS BEFORE DINNER   INSULIN PEN NEEDLE 31G X 5 MM MISC    Used to inject insulin   INSULIN SYRINGE-NEEDLE U-100 (INSULIN SYRINGE 1CC/30GX5/16") 30G X 5/16" 1 ML MISC    1 Syringe by Does not apply route daily. Use to inject insulin daily   LOSARTAN (COZAAR) 100 MG TABLET    Take 1 tablet (100 mg total) by mouth daily.   METFORMIN (GLUCOPHAGE-XR) 500 MG 24 HR TABLET    Take 4 tablets (2,000 mg total) by mouth daily.   TIZANIDINE (ZANAFLEX) 2 MG CAPSULE    Take 1 capsule (2 mg total) by mouth 3 (three) times daily.  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  Social History Social History   Socioeconomic History   Marital status: Married    Spouse name: Not on file   Number of children: Not on file   Years of education: Not on file   Highest education level: Not on file  Occupational History   Not on file  Tobacco Use   Smoking status: Every Day    Current packs/day: 0.55    Average packs/day: 0.6 packs/day for 51.0 years (28.1 ttl pk-yrs)    Types: Cigarettes    Passive exposure: Never   Smokeless tobacco: Never   Tobacco comments:    electronic cigarettes, 6/1/2020pt states on and  off  Vaping Use   Vaping status: Never Used  Substance and Sexual Activity   Alcohol use: No    Alcohol/week: 0.0 standard drinks of alcohol   Drug use: No   Sexual activity: Not Currently    Birth control/protection: None  Other Topics Concern   Not on file  Social History Narrative   Not on file   Social Determinants of Health   Financial Resource Strain: Low Risk  (04/03/2023)   Overall Financial Resource Strain (CARDIA)    Difficulty of Paying Living Expenses: Not hard at all  Food Insecurity: No Food Insecurity (04/03/2023)   Hunger Vital Sign    Worried About Running Out of Food in the Last Year: Never true    Ran Out of Food in the Last Year: Never true  Transportation Needs: No Transportation Needs (04/03/2023)   PRAPARE - Administrator, Civil Service (Medical): No    Lack of Transportation (Non-Medical): No  Physical Activity: Sufficiently Active (04/03/2023)   Exercise Vital Sign    Days of Exercise per Week: 2 days    Minutes of Exercise per Session: 110 min  Stress: No Stress Concern Present (04/03/2023)   [employer] of Occupational Health - Occupational Stress Questionnaire    Feeling of Stress : Not at all  Social Connections: Moderately Isolated (04/03/2023)   Social Connection and Isolation Panel [NHANES]    Frequency of Communication with Friends and Family: Once a week    Frequency of Social Gatherings with Friends and Family: Twice a week    Attends Religious Services: Never    Database administrator or Organizations: No    Attends Banker Meetings: Never    Marital Status: Married  Catering manager Violence: Not At Risk (04/03/2023)   Humiliation, Afraid, Rape, and Kick questionnaire    Fear of Current or Ex-Partner: No    Emotionally Abused: No    Physically Abused: No    Sexually Abused: No    Lab Results  Component Value Date   HGBA1C 13.4 05/22/2023   HGBA1C 11.6 (H) 02/26/2023   HGBA1C 11.9 12/27/2022   Lab  Results  Component Value Date   CHOL 191 02/26/2023  Lab Results  Component Value Date   HDL 46 02/26/2023   Lab Results  Component Value Date   LDLCALC 128 (H) 02/26/2023   Lab Results  Component Value Date   TRIG 93 02/26/2023   Lab Results  Component Value Date   CHOLHDL 4.2 02/26/2023   Lab Results  Component Value Date   CREATININE 0.73 (L) 02/26/2023   Lab Results  Component Value Date   GFR 90.96 01/15/2023   Lab Results  Component Value Date   MICROALBUR 2.2 (H) 11/13/2022      Component Value Date/Time   NA 142 02/26/2023 0935   K 4.6 02/26/2023 0935   CL 101 02/26/2023 0935   CO2 23 02/26/2023 0935   GLUCOSE 158 (H) 02/26/2023 0935   GLUCOSE 313 (H) 01/15/2023 0826   GLUCOSE 94 09/10/2006 1325   BUN 15 02/26/2023 0935   CREATININE 0.73 (L) 02/26/2023 0935   CALCIUM 9.8 02/26/2023 0935   PROT 7.4 02/26/2023 0935   ALBUMIN 4.6 02/26/2023 0935   AST 20 02/26/2023 0935   ALT 27 02/26/2023 0935   ALKPHOS 90 02/26/2023 0935   BILITOT 0.5 02/26/2023 0935   GFRNONAA 93 02/12/2020 0939   GFRAA 107 02/12/2020 0939      Latest Ref Rng & Units 02/26/2023    9:35 AM 01/15/2023    8:26 AM 11/13/2022    9:14 AM  BMP  Glucose 70 - 99 mg/dL 191  478  295   BUN 8 - 27 mg/dL 15  20  17    Creatinine 0.76 - 1.27 mg/dL 6.21  3.08  6.57   BUN/Creat Ratio 10 - 24 21     Sodium 134 - 144 mmol/L 142  132  134   Potassium 3.5 - 5.2 mmol/L 4.6  4.0  4.6   Chloride 96 - 106 mmol/L 101  98  98   CO2 20 - 29 mmol/L 23  29  28    Calcium 8.6 - 10.2 mg/dL 9.8  9.7  9.1        Component Value Date/Time   WBC 9.2 02/26/2023 0935   WBC 6.8 07/01/2018 0906   RBC 5.32 02/26/2023 0935   RBC 4.99 07/01/2018 0906   HGB 16.4 02/26/2023 0935   HCT 47.4 02/26/2023 0935   PLT 256 02/26/2023 0935   MCV 89 02/26/2023 0935   MCH 30.8 02/26/2023 0935   MCHC 34.6 02/26/2023 0935   MCHC 34.7 07/01/2018 0906   RDW 12.9 02/26/2023 0935   LYMPHSABS 1.4 02/26/2023 0935   MONOABS  0.6 07/01/2018 0906   EOSABS 0.2 02/26/2023 0935   BASOSABS 0.0 02/26/2023 0935     Parts of this note may have been dictated using voice recognition software. There may be variances in spelling and vocabulary which are unintentional. Not all errors are proofread. Please notify the Thereasa Parkin if any discrepancies are noted or if the meaning of any statement is not clear.

## 2023-06-08 NOTE — Patient Instructions (Signed)
Humulin 70/30 46 units before breakfast and 26 units before supper, take 15 to 30 minutes before meals

## 2023-06-26 ENCOUNTER — Encounter: Payer: Self-pay | Admitting: Family Medicine

## 2023-06-26 ENCOUNTER — Ambulatory Visit (INDEPENDENT_AMBULATORY_CARE_PROVIDER_SITE_OTHER): Payer: Medicare HMO | Admitting: Family Medicine

## 2023-06-26 VITALS — BP 139/80 | HR 74 | Ht 67.0 in | Wt 168.1 lb

## 2023-06-26 DIAGNOSIS — E782 Mixed hyperlipidemia: Secondary | ICD-10-CM | POA: Diagnosis not present

## 2023-06-26 DIAGNOSIS — E1169 Type 2 diabetes mellitus with other specified complication: Secondary | ICD-10-CM

## 2023-06-26 DIAGNOSIS — E1165 Type 2 diabetes mellitus with hyperglycemia: Secondary | ICD-10-CM

## 2023-06-26 DIAGNOSIS — Z794 Long term (current) use of insulin: Secondary | ICD-10-CM | POA: Diagnosis not present

## 2023-06-26 DIAGNOSIS — Z23 Encounter for immunization: Secondary | ICD-10-CM | POA: Diagnosis not present

## 2023-06-26 MED ORDER — ATORVASTATIN CALCIUM 40 MG PO TABS
40.0000 mg | ORAL_TABLET | Freq: Every day | ORAL | 3 refills | Status: DC
Start: 2023-06-26 — End: 2023-07-24

## 2023-06-26 NOTE — Patient Instructions (Addendum)
It was nice to see you today,  We addressed the following topics today: -I will reach out to your endocrinologist to see if there are any additional medications to try for your diabetes and also talk to them about a continuous glucose monitor - I have ordered your atorvastatin again.  You should be on this medication because of your diabetes.  I would like you to take one half of a tablet for the first 2 weeks and then increase it to the full tablet. - In order to get surgical clearance you will need to have your blood sugar in much better control. - Avoid any food that is high in carbohydrates such as breads, pastas, starchy vegetables like potatoes or corn, drinks with sugar in them, etc. - I would like to see you back in 1 month to make sure we do not wait too long when attempting to get your blood sugar under control.  Have a great day,  Frederic Jericho, MD  -

## 2023-06-26 NOTE — Progress Notes (Unsigned)
   Established Patient Office Visit  Subjective   Patient ID: Tyler Wolf, male    DOB: 03/14/1956  Age: 67 y.o. MRN: [id number]  No chief complaint on file.   HPI  DM2-patient states he takes his insulin 46 units at breakfast and 26 units before supper.  He is more compliant with his insulin than he has with his metformin.  Sometimes misses the metformin dose to 3 times a week.  Patient says he is not eating bread, tries to avoid sweets, drinks mostly water.  He is uncertain if he has ever tried any other diabetes medications.  Does not think he is intolerant of any diabetes medications.  Has never tried a continuous glucose monitor.  Patient states he was told he wants his blood sugar to be under 7.8 for his A1c before they will do his shoulder surgery.  Patient's eye doctor is Dr. Luciana Axe.  Sees him yearly every January.  HLD-patient is not currently taking his statin.  Thinks it has been at least a year since he last took it.  States he stopped taking it because it "ran out".  We discussed why he is a diabetic he should be on a cholesterol medication continuously.   The 10-year ASCVD risk score (Arnett DK, et al., 2019) is: 44.5%  Health Maintenance Due  Topic Date Due   HIV Screening  Never done   Hepatitis C Screening  Never done   Pneumonia Vaccine 73+ Years old (2 of 2 - PCV) 10/18/2013   OPHTHALMOLOGY EXAM  11/09/2021   INFLUENZA VACCINE  05/17/2023   COVID-19 Vaccine (3 - 2023-24 season) 06/17/2023      Objective:     There were no vitals taken for this visit. {Vitals History (Optional):23777}  Physical Exam General: Alert, oriented CV: Regular rate rhythm Pulmonary: Lungs.  Bilaterally Psych: Pleasant affect.   No results found for any visits on 06/26/23.      Assessment & Plan:   There are no diagnoses linked to this encounter.   No follow-ups on file.    Sandre Kitty, MD

## 2023-06-28 ENCOUNTER — Encounter: Payer: Self-pay | Admitting: Family Medicine

## 2023-06-28 NOTE — Assessment & Plan Note (Signed)
Patient has not been taking his statin since the initial prescription ended.  Discussed with him however this needs to be taken long-term to reduce his heart attack and stroke risk.  Patient agreeable to restarting.  Will send in new prescription.

## 2023-06-28 NOTE — Assessment & Plan Note (Signed)
Patient's A1c is 13.4 1-month ago.  Has a long history of poorly controlled diabetes, appears to be due to noncompliance with medications.  His diabetes is managed by endocrinology.  He just had an appointment with them last month.  He was able to tell me the dose of his insulin correctly and his metformin dose.  Does admit to being less compliant with metformin and his insulin.  Chart review did not show any results regarding SGLT2 inhibitors having been tried in the past and the only result for GLP-1's was a mention of sending in a prescription 2 years ago by Dr. Everardo All.  Will reach out to his endocrinologist to see if either of those are an option or if he has been tried on them in the past.  Patient mentioned to me that he had never known about the existence of continuous glucose monitors and would be willing to try 1.  This contradicts his endocrinologist note from a month ago in which they documented telling him about CGM's and prescribing him a Dexcom.  Uncertain why patient does not seem to remember this but may want to consider further evaluation of cognitive function or reach out to family for more history regarding any concerns with his memory.  Patient was alone in his visit today.  Discussed if his wife helps him with his medications and he states no because she "has her own issues she is dealing with".

## 2023-07-24 ENCOUNTER — Ambulatory Visit (INDEPENDENT_AMBULATORY_CARE_PROVIDER_SITE_OTHER): Payer: Medicare HMO | Admitting: Family Medicine

## 2023-07-24 ENCOUNTER — Telehealth: Payer: Self-pay

## 2023-07-24 ENCOUNTER — Encounter: Payer: Self-pay | Admitting: Family Medicine

## 2023-07-24 VITALS — BP 139/84 | HR 73 | Ht 67.0 in | Wt 166.1 lb

## 2023-07-24 DIAGNOSIS — E782 Mixed hyperlipidemia: Secondary | ICD-10-CM

## 2023-07-24 DIAGNOSIS — I152 Hypertension secondary to endocrine disorders: Secondary | ICD-10-CM | POA: Diagnosis not present

## 2023-07-24 DIAGNOSIS — E1165 Type 2 diabetes mellitus with hyperglycemia: Secondary | ICD-10-CM | POA: Diagnosis not present

## 2023-07-24 DIAGNOSIS — E1159 Type 2 diabetes mellitus with other circulatory complications: Secondary | ICD-10-CM | POA: Diagnosis not present

## 2023-07-24 DIAGNOSIS — E1169 Type 2 diabetes mellitus with other specified complication: Secondary | ICD-10-CM

## 2023-07-24 MED ORDER — LOSARTAN POTASSIUM 100 MG PO TABS: 100.0000 mg | ORAL_TABLET | Freq: Every day | ORAL | 1 refills | Status: DC

## 2023-07-24 MED ORDER — EMPAGLIFLOZIN 10 MG PO TABS
10.0000 mg | ORAL_TABLET | Freq: Every day | ORAL | 3 refills | Status: DC
Start: 2023-07-24 — End: 2023-08-02

## 2023-07-24 MED ORDER — ATORVASTATIN CALCIUM 40 MG PO TABS: 40.0000 mg | ORAL_TABLET | Freq: Every day | ORAL | 3 refills | Status: DC

## 2023-07-24 NOTE — Progress Notes (Signed)
   Established Patient Office Visit  Subjective   Patient ID: Tyler Wolf, male    DOB: 08-16-1956  Age: 67 y.o. MRN: [id number]  Chief Complaint  Patient presents with   Medical Management of Chronic Issues    HPI  HLD-patient has not been taking his atorvastatin since our last visit.  Initially seemed to not remember that we had prescribed it, but then recalled our last conversation but admits he never picked up the medication from [facility].  DM2-patient endorses missing several doses.  Only taking metformin once a day if he takes it.  Seems to be out of most of his other medications except for metformin at this time.  He has a continuous glucose monitor but has not used it yet.  I advised him we can help him set it up if he would bring it to the office.  Discussed with patient sending in a referral to the pharmacist who can help with his compliance hopefully.   The 10-year ASCVD risk score (Arnett DK, et al., 2019) is: 40.1%  Health Maintenance Due  Topic Date Due   HIV Screening  Never done   Hepatitis C Screening  Never done   Pneumonia Vaccine 41+ Years old (2 of 2 - PCV) 10/18/2013   COVID-19 Vaccine (3 - 2023-24 season) 06/17/2023      Objective:     BP 139/84   Pulse 73   Ht 5\' 7"  (1.702 m)   Wt 166 lb 1.9 oz (75.4 kg)   SpO2 100%   BMI 26.02 kg/m    Physical Exam General: Alert, oriented Pulmonary: No respiratory stress Psych: Pleasant affect   No results found for any visits on 07/24/23.      Assessment & Plan:   Poorly controlled diabetes mellitus (HCC)- mgt per Endo; Dr Everardo All Assessment & Plan: Continues to have issues with compliance.  Endorses being out of several medications currently.  Discussed adding Jardiance.  Discussed sending in referral to pharmacy to help with compliance.  Patient may benefit from prepackaged blister packs.  Offered to help patient with setting up his CGM if he brings it to the office.  Follow-up in 2 months for  repeat A1c.  Orders: -     Empagliflozin; Take 1 tablet (10 mg total) by mouth daily before breakfast.  Dispense: 30 tablet; Refill: 3 -     AMB Referral to Pharmacy Medication Management  Hypertension associated with type 2 diabetes mellitus (HCC) -     Losartan Potassium; Take 1 tablet (100 mg total) by mouth daily.  Dispense: 90 tablet; Refill: 1  Mixed diabetic hyperlipidemia associated with type 2 diabetes mellitus (HCC) Assessment & Plan: Patient never picked up his atorvastatin from last visit.  Resending in again.  Discussed with patient the importance of controlling his cholesterol and diabetes.  Orders: -     Atorvastatin Calcium; Take 1 tablet (40 mg total) by mouth daily.  Dispense: 90 tablet; Refill: 3     Return in about 2 months (around 09/23/2023) for DM, hld.    Sandre Kitty, MD

## 2023-07-24 NOTE — Progress Notes (Signed)
   Care Guide Note  07/24/2023 Name: Tyler Wolf MRN: [id number] DOB: 1956/08/18  Referred by: Sandre Kitty, MD Reason for referral : Care Coordination (Outreach to schedule with Pharm d)   Tyler Wolf is a 67 y.o. year old male who is a primary care patient of Sandre Kitty, MD. Tyler Wolf was referred to the pharmacist for assistance related to DM.    An unsuccessful telephone outreach was attempted today to contact the patient who was referred to the pharmacy team for assistance with medication management. Additional attempts will be made to contact the patient.   Penne Lash, RMA Care Guide [facility]  [place], [place] [address] Direct Dial: [phone] [email or web address]

## 2023-07-24 NOTE — Assessment & Plan Note (Signed)
Continues to have issues with compliance.  Endorses being out of several medications currently.  Discussed adding Jardiance.  Discussed sending in referral to pharmacy to help with compliance.  Patient may benefit from prepackaged blister packs.  Offered to help patient with setting up his CGM if he brings it to the office.  Follow-up in 2 months for repeat A1c.

## 2023-07-24 NOTE — Assessment & Plan Note (Signed)
Patient never picked up his atorvastatin from last visit.  Resending in again.  Discussed with patient the importance of controlling his cholesterol and diabetes.

## 2023-07-24 NOTE — Patient Instructions (Signed)
It was nice to see you today,  We addressed the following topics today: -I have sent in the refills for your blood pressure medicine and your cholesterol medicine. - I have also sent in a new prescription for Jardiance to be taken once a day - I have put in a referral to the pharmacist who can help you improve your compliance with the medication.  This can include doing things such as prepackaging all your pills for you so that you do not have to worry about doing this - If you have difficulty with the continuous glucose monitor and setting it up, you can schedule an appointment with [place] so that we can go over it with you.  Otherwise I will see back in 2 months.  Have a great day,  Frederic Jericho, MD

## 2023-07-31 NOTE — Progress Notes (Signed)
   Care Guide Note  07/31/2023 Name: ARTEMIS LOYAL MRN: [id number] DOB: 30-May-1956  Referred by: Sandre Kitty, MD Reason for referral : Care Coordination (Outreach to schedule with Pharm d)   LINH JOHANNES is a 67 y.o. year old male who is a primary care patient of Sandre Kitty, MD. ONA RATHERT was referred to the pharmacist for assistance related to DM.    Successful contact was made with the patient to discuss pharmacy services including being ready for the pharmacist to call at least 5 minutes before the scheduled appointment time, to have medication bottles and any blood sugar or blood pressure readings ready for review. The patient agreed to meet with the pharmacist via with the pharmacist via telephone visit on (date/time).  08/01/2023  Penne Lash, RMA Care Guide [facility]  [place], [place] [address] Direct Dial: [phone] [email or web address]

## 2023-08-01 ENCOUNTER — Other Ambulatory Visit: Payer: Self-pay

## 2023-08-01 ENCOUNTER — Other Ambulatory Visit: Payer: Self-pay | Admitting: Pharmacist

## 2023-08-01 NOTE — Progress Notes (Signed)
08/01/2023 Name: OMERO KOWAL MRN: [id number] DOB: 10/09/1956  No chief complaint on file.   BRODRICK CURRAN is a 67 y.o. year old male who presented for a telephone visit.   They were referred to the pharmacist by their PCP for assistance in managing diabetes and medication access.   Patient reports he has a personal goal A1c of 7.8, which is what he needs for them to consider him a candidate for shoulder surgery.  Reviewed current medication list as well as time of day to take medications, as below:  AM: Metformin XR 500mg , take 2 tablets  Jardiance 10mg   Losartan 100mg    PM: Atorvastatin 40mg  Metformin XR 500mg , take 2 tablets  As well as humulin 70/30 pen taking 45 units AM, 25 PM   Subjective:  Care Team: Primary Care Provider: Sandre Kitty, MD   Medication Access/Adherence  Current Pharmacy:  [facility] - [place], [place] - [address] [place] [address] Phone: [phone] Fax: [phone]  Patient reports adherence concerns with their medications:  Yes  states he has a hard time remembering them   Diabetes:  Current medications: humulin 70/30 pen taking 45 units AM, 25 PM, has been out of insulin for past 2 days. He will go pick it up today.  Current glucose readings: 230s fasting Using traditional meter; testing occasionally, still needs to set up Dexcom sensor  Patient denies hypoglycemic s/sx including dizziness, shakiness, sweating. Patient reports hyperglycemic symptoms including polyuria, polydipsia, polyphagia, nocturia, neuropathy, blurred vision.  Current meal patterns: to be discussed at future visits   Current medication access support: none at present  Medication Management:  Current adherence strategy: sets bottles in a bathroom that is not in his daily routine  Patient reports Poor adherence to medications  Patient reports the following barriers to adherence: forgets to take. No side  effects reported.   Objective:  Lab Results  Component Value Date   HGBA1C 13.4 05/22/2023    Lab Results  Component Value Date   CREATININE 0.73 (L) 02/26/2023   BUN 15 02/26/2023   NA 142 02/26/2023   K 4.6 02/26/2023   CL 101 02/26/2023   CO2 23 02/26/2023    Lab Results  Component Value Date   CHOL 191 02/26/2023   HDL 46 02/26/2023   LDLCALC 128 (H) 02/26/2023   LDLDIRECT 162.0 11/13/2022   TRIG 93 02/26/2023   CHOLHDL 4.2 02/26/2023    Medications Reviewed Today     Reviewed by Gabriel Carina, RPH (Pharmacist) on 08/01/23 at 1111  Med List Status: <None>   Medication Order Taking? Sig Documenting Provider Last Dose Status Informant  aspirin 81 MG tablet [id number] Yes Take 81 mg by mouth daily. [provider] Taking Active   atorvastatin (LIPITOR) 40 MG tablet [id number] Yes Take 1 tablet (40 mg total) by mouth daily. Sandre Kitty, MD Taking Active   bacitracin 500 UNIT/GM ointment [id number] No Apply 1 application topically 2 (two) times daily.  Patient not taking: Reported on 05/16/2023   Mayer Masker, PA-C Not Taking Active   cholecalciferol (VITAMIN D3) 25 MCG (1000 UNIT) tablet [id number] Yes Take 2,000 Units by mouth daily. [provider] Taking Active   Continuous Glucose Receiver [facility] G7 RECEIVER) DEVI [id number] No 1 Device by Does not apply route continuous.  Patient not taking: Reported on 06/08/2023   Altamese Buckland, MD Not Taking Active   Continuous Glucose Sensor (DEXCOM G7 SENSOR) MISC  [id number] No 1 Device by Does not apply route continuous.  Patient not taking: Reported on 06/08/2023   Altamese [place], MD Not Taking Active   cyclobenzaprine (FLEXERIL) 10 MG tablet [id number] No Take 1 tablet (10 mg total) by mouth 2 (two) times daily as needed for muscle spasms. Tomi Bamberger, PA-C Unknown Active   empagliflozin (JARDIANCE) 10 MG TABS tablet [id number] Yes Take 1 tablet (10 mg total) by mouth daily before breakfast. Sandre Kitty, MD Taking Active   glucose blood (CONTOUR NEXT TEST) test strip [id number] Yes 1 each by Other route 2 (two) times daily. And lancets 2/day Reather Littler, MD Taking Active   insulin isophane & regular human KwikPen (HUMULIN 70/30 KWIKPEN) (70-30) 100 UNIT/ML KwikPen [id number] Yes INJECT 40 UNITS BEFORE BREAKFAST AND 20 UNITS BEFORE DINNER  Patient taking differently: INJECT 40 UNITS BEFORE BREAKFAST AND 25 UNITS BEFORE Delynn Flavin, MD Taking Active   Insulin Pen Needle 31G X 5 MM MISC [id number] Yes Used to inject insulin Reather Littler, MD Taking Active   Insulin Syringe-Needle U-100 (INSULIN SYRINGE 1CC/30GX5/16") 30G X 5/16" 1 ML MISC [id number]  1 Syringe by Does not apply route daily. Use to inject insulin daily Romero Belling, MD  Active   losartan (COZAAR) 100 MG tablet [id number] Yes Take 1 tablet (100 mg total) by mouth daily. Sandre Kitty, MD Taking Active   metFORMIN (GLUCOPHAGE-XR) 500 MG 24 hr tablet [id number] Yes Take 4 tablets (2,000 mg total) by mouth daily. Reather Littler, MD Taking Active   tizanidine (ZANAFLEX) 2 MG capsule [id number] No Take 1 capsule (2 mg total) by mouth 3 (three) times daily.  Patient not taking: Reported on 08/01/2023   Rhys Martini, PA-C Not Taking Active               Assessment/Plan:   Diabetes: - Currently uncontrolled - Reviewed long term cardiovascular and renal outcomes of uncontrolled blood sugar - Reviewed goal A1c, goal fasting, and goal 2 hour post prandial glucose - Recommend to continue current medications for now, goal to initiate ozempic 0.25mg  weekly using patient assistance program to avoid cost barrier. Goal will be to titrate ozempic for glycemic control, CV risk benefit, and reduce insulin requirements. Will collaborate with PCP - Recommend to check glucose daily - Meets financial criteria for ozempic patient assistance program through [employer]. Will collaborate with CPhT, and patient to pursue assistance after  collaboration with PCP.    Medication Management: - Currently strategy insufficient to maintain appropriate adherence to prescribed medication regimen - Discussed collaboration with local pharmacies for adherence packaging. Reviewed local pharmacies with adherence packaging options. Patient elects to transfer medications to [facility] for mail delivery + adherence packaging. Provided patient phone number to [facility] to confirm mailing address/payment. - Will request PCP to send all RX to [facility], updated preferred pharmacy in chart. Sent secure email to [employer] for adherence packaging setup   Follow Up Plan: 2-4 weeks to assess transition  Lynnda Shields, PharmD, BCPS Clinical Pharmacist [facility]

## 2023-08-01 NOTE — Patient Instructions (Signed)
Please call the [facility] at [phone] to confirm your shipping address and provide a method of payment to place on file for future copays.   You can request prescription refills and request mail order through the MyChart App.

## 2023-08-02 ENCOUNTER — Other Ambulatory Visit (HOSPITAL_COMMUNITY): Payer: Self-pay

## 2023-08-02 ENCOUNTER — Other Ambulatory Visit: Payer: Self-pay

## 2023-08-02 ENCOUNTER — Other Ambulatory Visit: Payer: Self-pay | Admitting: Family Medicine

## 2023-08-02 DIAGNOSIS — I152 Hypertension secondary to endocrine disorders: Secondary | ICD-10-CM

## 2023-08-02 DIAGNOSIS — E1165 Type 2 diabetes mellitus with hyperglycemia: Secondary | ICD-10-CM

## 2023-08-02 DIAGNOSIS — E1169 Type 2 diabetes mellitus with other specified complication: Secondary | ICD-10-CM

## 2023-08-02 MED ORDER — METFORMIN HCL ER 500 MG PO TB24
1000.0000 mg | ORAL_TABLET | Freq: Two times a day (BID) | ORAL | 3 refills | Status: DC
  Filled 2023-08-02 (×2): qty 120, 30d supply, fill #0
  Filled 2023-08-24 – 2023-08-27 (×2): qty 120, 30d supply, fill #1
  Filled 2023-10-03 – 2023-10-19 (×3): qty 120, 30d supply, fill #2

## 2023-08-02 MED ORDER — HUMULIN 70/30 KWIKPEN (70-30) 100 UNIT/ML ~~LOC~~ SUPN
PEN_INJECTOR | SUBCUTANEOUS | 1 refills | Status: DC
  Filled 2023-08-02 (×2): qty 15, 21d supply, fill #0
  Filled 2023-08-31: qty 15, 21d supply, fill #1

## 2023-08-02 MED ORDER — LOSARTAN POTASSIUM 100 MG PO TABS
100.0000 mg | ORAL_TABLET | Freq: Every day | ORAL | 3 refills | Status: DC
  Filled 2023-08-02: qty 30, 30d supply, fill #0
  Filled 2023-08-24: qty 90, 90d supply, fill #0

## 2023-08-02 MED ORDER — EMPAGLIFLOZIN 10 MG PO TABS
10.0000 mg | ORAL_TABLET | Freq: Every day | ORAL | 3 refills | Status: DC
  Filled 2023-08-02 – 2023-08-27 (×3): qty 30, 30d supply, fill #0
  Filled 2023-10-03 – 2024-03-12 (×5): qty 30, 30d supply, fill #1

## 2023-08-02 MED ORDER — ATORVASTATIN CALCIUM 40 MG PO TABS
40.0000 mg | ORAL_TABLET | Freq: Every day | ORAL | 3 refills | Status: DC
  Filled 2023-08-02 (×2): qty 30, 30d supply, fill #0

## 2023-08-03 ENCOUNTER — Other Ambulatory Visit (HOSPITAL_COMMUNITY): Payer: Self-pay

## 2023-08-06 ENCOUNTER — Other Ambulatory Visit (HOSPITAL_COMMUNITY): Payer: Self-pay

## 2023-08-07 ENCOUNTER — Other Ambulatory Visit (HOSPITAL_COMMUNITY): Payer: Self-pay

## 2023-08-08 ENCOUNTER — Ambulatory Visit: Payer: Medicare HMO | Admitting: "Endocrinology

## 2023-08-15 ENCOUNTER — Other Ambulatory Visit (HOSPITAL_COMMUNITY): Payer: Self-pay

## 2023-08-16 ENCOUNTER — Ambulatory Visit: Payer: Medicare HMO | Admitting: "Endocrinology

## 2023-08-16 ENCOUNTER — Encounter: Payer: Self-pay | Admitting: "Endocrinology

## 2023-08-16 VITALS — BP 140/82 | HR 82 | Ht 67.0 in | Wt 170.2 lb

## 2023-08-16 DIAGNOSIS — E1165 Type 2 diabetes mellitus with hyperglycemia: Secondary | ICD-10-CM

## 2023-08-16 DIAGNOSIS — E7800 Pure hypercholesterolemia, unspecified: Secondary | ICD-10-CM | POA: Diagnosis not present

## 2023-08-16 DIAGNOSIS — Z7984 Long term (current) use of oral hypoglycemic drugs: Secondary | ICD-10-CM

## 2023-08-16 DIAGNOSIS — Z794 Long term (current) use of insulin: Secondary | ICD-10-CM | POA: Diagnosis not present

## 2023-08-16 LAB — LIPID PANEL
Cholesterol: 189 mg/dL (ref 0–200)
HDL: 42.3 mg/dL (ref >39.00)
LDL Cholesterol: 118 mg/dL — ABNORMAL HIGH (ref 0–99)
NonHDL: 146.38
Total CHOL/HDL Ratio: 4
Triglycerides: 141 mg/dL (ref 0.0–149.0)
VLDL: 28.2 mg/dL (ref 0.0–40.0)

## 2023-08-16 LAB — COMPREHENSIVE METABOLIC PANEL
ALT: 28 U/L (ref 0–53)
AST: 20 U/L (ref 0–37)
Albumin: 4.5 g/dL (ref 3.5–5.2)
Alkaline Phosphatase: 60 U/L (ref 39–117)
BUN: 17 mg/dL (ref 6–23)
CO2: 28 meq/L (ref 19–32)
Calcium: 9.5 mg/dL (ref 8.4–10.5)
Chloride: 104 meq/L (ref 96–112)
Creatinine, Ser: 0.82 mg/dL (ref 0.40–1.50)
GFR: 91.9 mL/min (ref >60.00)
Glucose, Bld: 172 mg/dL — ABNORMAL HIGH (ref 70–99)
Potassium: 4.3 meq/L (ref 3.5–5.1)
Sodium: 141 meq/L (ref 135–145)
Total Bilirubin: 0.5 mg/dL (ref 0.2–1.2)
Total Protein: 7.2 g/dL (ref 6.0–8.3)

## 2023-08-16 LAB — GLUCOSE, POCT (MANUAL RESULT ENTRY): POC Glucose: 174 mg/dL — AB (ref 70–99)

## 2023-08-16 NOTE — Progress Notes (Signed)
Outpatient Endocrinology Note Tyler [place], MD  08/16/23   CALLETANO KURZWEIL [birth year] [id number]  Referring Provider: Sandre Kitty, MD Primary Care Provider: Sandre Kitty, MD Reason for consultation: Subjective   Assessment & Plan  Diagnoses and all orders for this visit:  Uncontrolled type 2 diabetes mellitus with hyperglycemia, with long-term current use of insulin (HCC) -     POCT Glucose (CBG) -     Lipid panel; Future -     Comprehensive metabolic panel; Future  Long term (current) use of oral hypoglycemic drugs  Pure hypercholesterolemia   Diabetes Type II complicated by hyperglycemia Lab Results  Component Value Date   GFR 90.96 01/15/2023   Hba1c goal less than 7, current Hba1c is  Lab Results  Component Value Date   HGBA1C 13.4 05/22/2023   Will recommend the following: Humulin 70/30 47 units before breakfast and 27 units before supper, encouraged compliance and taking 15 to 30 minutes before meals Continue metformin XR 500 mg 2 tablet twice a day Hs DexCom, will set it up today   Feels good, no symptoms of low BG  No known contraindications to any of above medications  -Last LD and Tg are as follows: Lab Results  Component Value Date   LDLCALC 128 (H) 02/26/2023    Lab Results  Component Value Date   TRIG 93 02/26/2023   -atorvastatin 40 mg every day -Follow low fat diet and exercise   -Blood pressure goal <140/90 - Microalbumin/creatinine goal is < 30 -Last MA/Cr is as follows: Lab Results  Component Value Date   MICROALBUR 2.2 (H) 11/13/2022   - on ACE/ARB losartan 100 mg qd -diet changes including salt restriction -limit eating outside -counseled BP targets per standards of diabetes care -uncontrolled blood pressure can lead to retinopathy, nephropathy and cardiovascular and atherosclerotic heart disease  Reviewed and counseled on: -A1C target -Blood sugar targets -Complications of uncontrolled diabetes  -Checking blood  sugar before meals and bedtime and bring log next visit -All medications with mechanism of action and side effects -Hypoglycemia management: rule of 15's, Glucagon Emergency Kit and medical alert ID -low-carb low-fat plate-method diet -At least 20 minutes of physical activity per day -Annual dilated retinal eye exam and foot exam -compliance and follow up needs -follow up as scheduled or earlier if problem gets worse  Call if blood sugar is less than 70 or consistently above 250    Take a 15 gm snack of carbohydrate at bedtime before you go to sleep if your blood sugar is less than 100.    If you are going to fast after midnight for a test or procedure, ask your physician for instructions on how to reduce/decrease your insulin dose.    Call if blood sugar is less than 70 or consistently above 250  -Treating a low sugar by rule of 15  (15 gms of sugar every 15 min until sugar is more than 70) If you feel your sugar is low, test your sugar to be sure If your sugar is low (less than 70), then take 15 grams of a fast acting Carbohydrate (3-4 glucose tablets or glucose gel or 4 ounces of juice or regular soda) Recheck your sugar 15 min after treating low to make sure it is more than 70 If sugar is still less than 70, treat again with 15 grams of carbohydrate          Don't drive the hour of hypoglycemia  If unconscious/unable to  eat or drink by mouth, use glucagon injection or nasal spray baqsimi and call 911. Can repeat again in 15 min if still unconscious.  Return in about 1 month (around 09/17/2023) for visit, labs today.   I have reviewed current medications, nurse's notes, allergies, vital signs, past medical and surgical history, family medical history, and social history for this encounter. Counseled patient on symptoms, examination findings, lab findings, imaging results, treatment decisions and monitoring and prognosis. The patient understood the recommendations and agrees with the  treatment plan. All questions regarding treatment plan were fully answered.  Tyler [place], MD  08/16/23    History of Present Illness Tyler Wolf is a 67 y.o. year old male who presents for evaluation of Type II diabetes mellitus.  Tyler HERNANDEZGARCI was first diagnosed in 2009. Diabetes education +  Home diabetes regimen: Humulin 70/30 47 units before breakfast and 27 units before supper Metformin XR 500 mg 2 tablets bid  BLOOD SUGAR DATA Not checking BG  Physical Exam  BP (!) 140/82   Pulse 82   Ht 5\' 7"  (1.702 m)   Wt 170 lb 3.2 oz (77.2 kg)   SpO2 98%   BMI 26.66 kg/m    Constitutional: well developed, well nourished Head: normocephalic, atraumatic Eyes: sclera anicteric, no redness Neck: supple Lungs: normal respiratory effort Neurology: alert and oriented Skin: dry, no appreciable rashes Musculoskeletal: no appreciable defects Psychiatric: normal mood and affect Diabetic Foot Exam - Simple   No data filed      Current Medications Patient's Medications  New Prescriptions   No medications on file  Previous Medications   ASPIRIN 81 MG TABLET    Take 81 mg by mouth daily.   ATORVASTATIN (LIPITOR) 40 MG TABLET    Take 1 tablet (40 mg total) by mouth daily.   BACITRACIN 500 UNIT/GM OINTMENT    Apply 1 application topically 2 (two) times daily.   CHOLECALCIFEROL (VITAMIN D3) 25 MCG (1000 UNIT) TABLET    Take 2,000 Units by mouth daily.   CONTINUOUS GLUCOSE RECEIVER (DEXCOM G7 RECEIVER) DEVI    1 Device by Does not apply route continuous.   CONTINUOUS GLUCOSE SENSOR (DEXCOM G7 SENSOR) MISC    1 Device by Does not apply route continuous.   CYCLOBENZAPRINE (FLEXERIL) 10 MG TABLET    Take 1 tablet (10 mg total) by mouth 2 (two) times daily as needed for muscle spasms.   EMPAGLIFLOZIN (JARDIANCE) 10 MG TABS TABLET    Take 1 tablet (10 mg total) by mouth daily before breakfast.   GLUCOSE BLOOD (CONTOUR NEXT TEST) TEST STRIP    1 each by Other route 2 (two) times daily.  And lancets 2/day   INSULIN ISOPHANE & REGULAR HUMAN KWIKPEN (HUMULIN 70/30 KWIKPEN) (70-30) 100 UNIT/ML KWIKPEN    INJECT 45 UNITS BEFORE BREAKFAST AND 25 UNITS BEFORE DINNER   INSULIN PEN NEEDLE 31G X 5 MM MISC    Used to inject insulin   INSULIN SYRINGE-NEEDLE U-100 (INSULIN SYRINGE 1CC/30GX5/16") 30G X 5/16" 1 ML MISC    1 Syringe by Does not apply route daily. Use to inject insulin daily   LOSARTAN (COZAAR) 100 MG TABLET    Take 1 tablet (100 mg total) by mouth daily.   METFORMIN (GLUCOPHAGE-XR) 500 MG 24 HR TABLET    Take 2 tablets (1,000 mg total) by mouth 2 (two) times daily with a meal.   TIZANIDINE (ZANAFLEX) 2 MG CAPSULE    Take 1 capsule (2 mg total) by  mouth 3 (three) times daily.  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  Social History Social History   Socioeconomic History   Marital status: Married    Spouse name: Not on file   Number of children: Not on file   Years of education: Not on file   Highest education level: Not on file  Occupational History   Not on file  Tobacco Use   Smoking status: Every Day    Current packs/day: 0.55    Average packs/day: 0.6 packs/day for 51.0 years (28.1 ttl pk-yrs)    Types: Cigarettes    Passive exposure: Never   Smokeless tobacco: Never   Tobacco comments:    electronic cigarettes, 6/1/2020pt states on and off  Vaping Use    Vaping status: Never Used  Substance and Sexual Activity   Alcohol use: No    Alcohol/week: 0.0 standard drinks of alcohol   Drug use: No   Sexual activity: Not Currently    Birth control/protection: None  Other Topics Concern   Not on file  Social History Narrative   Not on file   Social Determinants of Health   Financial Resource Strain: Low Risk  (04/03/2023)   Overall Financial Resource Strain (CARDIA)    Difficulty of Paying Living Expenses: Not hard at all  Food Insecurity: No Food Insecurity (04/03/2023)   Hunger Vital Sign    Worried About Running Out of Food in the Last Year: Never true    Ran Out of Food in the Last Year: Never true  Transportation Needs: No Transportation Needs (04/03/2023)   PRAPARE - Administrator, Civil Service (Medical): No    Lack of Transportation (Non-Medical): No  Physical Activity: Sufficiently Active (04/03/2023)   Exercise Vital Sign    Days of Exercise per Week: 2 days    Minutes of Exercise per Session: 110 min  Stress: No Stress Concern Present (04/03/2023)   [employer] of Occupational Health - Occupational Stress Questionnaire    Feeling of Stress : Not at all  Social Connections: Moderately Isolated (04/03/2023)   Social Connection and Isolation Panel [NHANES]    Frequency of Communication with Friends and Family: Once a week    Frequency of Social Gatherings with Friends and Family: Twice a week    Attends Religious Services: Never    Database administrator or Organizations: No    Attends Banker Meetings: Never    Marital Status: Married  Catering manager Violence: Not At Risk (04/03/2023)   Humiliation, Afraid, Rape, and Kick questionnaire    Fear of Current or Ex-Partner: No    Emotionally Abused: No    Physically Abused: No    Sexually Abused: No    Lab Results  Component Value Date   HGBA1C 13.4 05/22/2023   HGBA1C 11.6 (H) 02/26/2023  HGBA1C 11.9 12/27/2022   Lab Results  Component  Value Date   CHOL 191 02/26/2023   Lab Results  Component Value Date   HDL 46 02/26/2023   Lab Results  Component Value Date   LDLCALC 128 (H) 02/26/2023   Lab Results  Component Value Date   TRIG 93 02/26/2023   Lab Results  Component Value Date   CHOLHDL 4.2 02/26/2023   Lab Results  Component Value Date   CREATININE 0.73 (L) 02/26/2023   Lab Results  Component Value Date   GFR 90.96 01/15/2023   Lab Results  Component Value Date   MICROALBUR 2.2 (H) 11/13/2022      Component Value Date/Time   NA 142 02/26/2023 0935   K 4.6 02/26/2023 0935   CL 101 02/26/2023 0935   CO2 23 02/26/2023 0935   GLUCOSE 158 (H) 02/26/2023 0935   GLUCOSE 313 (H) 01/15/2023 0826   GLUCOSE 94 09/10/2006 1325   BUN 15 02/26/2023 0935   CREATININE 0.73 (L) 02/26/2023 0935   CALCIUM 9.8 02/26/2023 0935   PROT 7.4 02/26/2023 0935   ALBUMIN 4.6 02/26/2023 0935   AST 20 02/26/2023 0935   ALT 27 02/26/2023 0935   ALKPHOS 90 02/26/2023 0935   BILITOT 0.5 02/26/2023 0935   GFRNONAA 93 02/12/2020 0939   GFRAA 107 02/12/2020 0939      Latest Ref Rng & Units 02/26/2023    9:35 AM 01/15/2023    8:26 AM 11/13/2022    9:14 AM  BMP  Glucose 70 - 99 mg/dL 161  096  045   BUN 8 - 27 mg/dL 15  20  17    Creatinine 0.76 - 1.27 mg/dL 4.09  8.11  9.14   BUN/Creat Ratio 10 - 24 21     Sodium 134 - 144 mmol/L 142  132  134   Potassium 3.5 - 5.2 mmol/L 4.6  4.0  4.6   Chloride 96 - 106 mmol/L 101  98  98   CO2 20 - 29 mmol/L 23  29  28    Calcium 8.6 - 10.2 mg/dL 9.8  9.7  9.1        Component Value Date/Time   WBC 9.2 02/26/2023 0935   WBC 6.8 07/01/2018 0906   RBC 5.32 02/26/2023 0935   RBC 4.99 07/01/2018 0906   HGB 16.4 02/26/2023 0935   HCT 47.4 02/26/2023 0935   PLT 256 02/26/2023 0935   MCV 89 02/26/2023 0935   MCH 30.8 02/26/2023 0935   MCHC 34.6 02/26/2023 0935   MCHC 34.7 07/01/2018 0906   RDW 12.9 02/26/2023 0935   LYMPHSABS 1.4 02/26/2023 0935   MONOABS 0.6 07/01/2018 0906    EOSABS 0.2 02/26/2023 0935   BASOSABS 0.0 02/26/2023 0935     Parts of this note may have been dictated using voice recognition software. There may be variances in spelling and vocabulary which are unintentional. Not all errors are proofread. Please notify the Thereasa Parkin if any discrepancies are noted or if the meaning of any statement is not clear.

## 2023-08-17 ENCOUNTER — Other Ambulatory Visit: Payer: Self-pay | Admitting: "Endocrinology

## 2023-08-17 ENCOUNTER — Other Ambulatory Visit (HOSPITAL_COMMUNITY): Payer: Self-pay

## 2023-08-17 MED ORDER — ROSUVASTATIN CALCIUM 40 MG PO TABS
40.0000 mg | ORAL_TABLET | Freq: Every day | ORAL | 3 refills | Status: DC
  Filled 2023-08-17 (×2): qty 90, 90d supply, fill #0
  Filled 2023-09-19 – 2023-10-03 (×2): qty 90, 90d supply, fill #1
  Filled 2023-10-19 (×2): qty 30, 30d supply, fill #1
  Filled 2023-10-19: qty 90, 90d supply, fill #1
  Filled 2023-11-12: qty 30, 30d supply, fill #2
  Filled 2023-11-28 – 2023-12-05 (×2): qty 30, 30d supply, fill #3
  Filled 2023-12-10 – 2024-01-01 (×2): qty 30, 30d supply, fill #4
  Filled 2024-01-21 – 2024-01-29 (×4): qty 30, 30d supply, fill #5
  Filled 2024-02-07 – 2024-02-26 (×2): qty 30, 30d supply, fill #6
  Filled 2024-03-17 – 2024-04-10 (×2): qty 30, 30d supply, fill #7
  Filled 2024-05-08: qty 30, 30d supply, fill #8
  Filled 2024-06-10: qty 30, 30d supply, fill #9

## 2023-08-24 ENCOUNTER — Other Ambulatory Visit: Payer: Self-pay

## 2023-08-27 ENCOUNTER — Other Ambulatory Visit (HOSPITAL_COMMUNITY): Payer: Self-pay

## 2023-08-27 ENCOUNTER — Other Ambulatory Visit: Payer: Self-pay

## 2023-08-31 ENCOUNTER — Other Ambulatory Visit: Payer: Self-pay

## 2023-08-31 ENCOUNTER — Other Ambulatory Visit (HOSPITAL_COMMUNITY): Payer: Self-pay

## 2023-08-31 DIAGNOSIS — E1165 Type 2 diabetes mellitus with hyperglycemia: Secondary | ICD-10-CM

## 2023-08-31 MED ORDER — DEXCOM G7 SENSOR MISC
1.0000 | 0 refills | Status: DC
  Filled 2023-08-31: qty 9, 90d supply, fill #0

## 2023-09-01 ENCOUNTER — Other Ambulatory Visit (HOSPITAL_COMMUNITY): Payer: Self-pay

## 2023-09-03 ENCOUNTER — Other Ambulatory Visit (HOSPITAL_COMMUNITY): Payer: Self-pay

## 2023-09-10 ENCOUNTER — Other Ambulatory Visit (HOSPITAL_COMMUNITY): Payer: Self-pay

## 2023-09-10 ENCOUNTER — Encounter: Payer: Self-pay | Admitting: "Endocrinology

## 2023-09-10 ENCOUNTER — Other Ambulatory Visit: Payer: Self-pay

## 2023-09-10 ENCOUNTER — Other Ambulatory Visit (INDEPENDENT_AMBULATORY_CARE_PROVIDER_SITE_OTHER): Payer: Medicare HMO

## 2023-09-10 ENCOUNTER — Ambulatory Visit: Payer: Medicare HMO | Admitting: "Endocrinology

## 2023-09-10 VITALS — BP 140/100 | HR 86 | Ht 67.0 in | Wt 173.2 lb

## 2023-09-10 DIAGNOSIS — E1165 Type 2 diabetes mellitus with hyperglycemia: Secondary | ICD-10-CM | POA: Diagnosis not present

## 2023-09-10 DIAGNOSIS — Z7984 Long term (current) use of oral hypoglycemic drugs: Secondary | ICD-10-CM | POA: Diagnosis not present

## 2023-09-10 DIAGNOSIS — Z794 Long term (current) use of insulin: Secondary | ICD-10-CM

## 2023-09-10 DIAGNOSIS — E7800 Pure hypercholesterolemia, unspecified: Secondary | ICD-10-CM | POA: Diagnosis not present

## 2023-09-10 LAB — POCT GLYCOSYLATED HEMOGLOBIN (HGB A1C): Hemoglobin A1C: 8.3 % — AB (ref 4.0–5.6)

## 2023-09-10 MED ORDER — SYNJARDY XR 12.5-1000 MG PO TB24
2.0000 | ORAL_TABLET | Freq: Two times a day (BID) | ORAL | 5 refills | Status: DC
  Filled 2023-09-10: qty 120, 30d supply, fill #0
  Filled 2023-10-03 – 2023-10-19 (×2): qty 60, 15d supply, fill #0
  Filled 2023-10-19 (×2): qty 120, 30d supply, fill #0
  Filled 2023-11-12: qty 120, 30d supply, fill #1
  Filled 2023-11-28 – 2023-12-05 (×2): qty 120, 30d supply, fill #2

## 2023-09-10 MED ORDER — TRULICITY 0.75 MG/0.5ML ~~LOC~~ SOAJ
0.7500 mg | SUBCUTANEOUS | 1 refills | Status: DC
Start: 2023-09-10 — End: 2023-10-08
  Filled 2023-09-10: qty 2, 28d supply, fill #0

## 2023-09-10 NOTE — Progress Notes (Addendum)
Outpatient Endocrinology Note Tyler Wolf , MD  09/10/23   Tyler Wolf 1955/11/27 [id number]  Referring Provider: Sandre Kitty, MD Primary Care Provider: Sandre Kitty, MD Reason for consultation: Subjective   Assessment & Plan  Diagnoses and all orders for this visit:  Uncontrolled type 2 diabetes mellitus with hyperglycemia, with long-term current use of insulin (HCC)  Long term (current) use of oral hypoglycemic drugs  Long-term insulin use (HCC)  Pure hypercholesterolemia  Other orders -     Empagliflozin-metFORMIN HCl ER (SYNJARDY XR) 12.02-999 MG TB24; Take 2 tablets by mouth 2 (two) times daily. -     Dulaglutide (TRULICITY) 0.75 MG/0.5ML SOAJ; Inject 0.75 mg into the skin once a week.    Diabetes Type II complicated by hyperglycemia Lab Results  Component Value Date   GFR 91.90 08/16/2023   Hba1c goal less than 7, current Hba1c is  Lab Results  Component Value Date   HGBA1C 8.3 (A) 09/10/2023   Will recommend the following: Humulin 70/30 47 units before breakfast and 22 units before supper, encouraged compliance and taking 15 to 30 minutes before meals Start Synjardy XR 12.02/999 mg BID (IF not covered, take jardiance 25 mg every day and metformin XR 500 mg 2 tablet twice a day) Start trulicity 0.75 mg/week Has DexCom   No known contraindications to any of above medications No history of MEN syndrome/medullary thyroid cancer/pancreatitis or pancreatic cancer in self or family  -Last LD and Tg are as follows: Lab Results  Component Value Date   LDLCALC 118 (H) 08/16/2023    Lab Results  Component Value Date   TRIG 141.0 08/16/2023   -rosuvastatin 40 mg every day since 08/2023 -Follow low fat diet and exercise   -Blood pressure goal <140/90 - Microalbumin/creatinine goal is < 30 -Last MA/Cr is as follows: Lab Results  Component Value Date   MICROALBUR 2.2 (H) 11/13/2022   - on ACE/ARB losartan 100 mg qd -diet changes including  salt restriction -limit eating outside -counseled BP targets per standards of diabetes care -uncontrolled blood pressure can lead to retinopathy, nephropathy and cardiovascular and atherosclerotic heart disease  Reviewed and counseled on: -A1C target -Blood sugar targets -Complications of uncontrolled diabetes  -Checking blood sugar before meals and bedtime and bring log next visit -All medications with mechanism of action and side effects -Hypoglycemia management: rule of 15's, Glucagon Emergency Kit and medical alert ID -low-carb low-fat plate-method diet -At least 20 minutes of physical activity per day -Annual dilated retinal eye exam and foot exam -compliance and follow up needs -follow up as scheduled or earlier if problem gets worse  Call if blood sugar is less than 70 or consistently above 250    Take a 15 gm snack of carbohydrate at bedtime before you go to sleep if your blood sugar is less than 100.    If you are going to fast after midnight for a test or procedure, ask your physician for instructions on how to reduce/decrease your insulin dose.    Call if blood sugar is less than 70 or consistently above 250  -Treating a low sugar by rule of 15  (15 gms of sugar every 15 min until sugar is more than 70) If you feel your sugar is low, test your sugar to be sure If your sugar is low (less than 70), then take 15 grams of a fast acting Carbohydrate (3-4 glucose tablets or glucose gel or 4 ounces of juice or regular soda) Recheck  your sugar 15 min after treating low to make sure it is more than 70 If sugar is still less than 70, treat again with 15 grams of carbohydrate          Don't drive the hour of hypoglycemia  If unconscious/unable to eat or drink by mouth, use glucagon injection or nasal spray baqsimi and call 911. Can repeat again in 15 min if still unconscious.  Return in about 4 weeks (around 10/08/2023).   I have reviewed current medications, nurse's notes,  allergies, vital signs, past medical and surgical history, family medical history, and social history for this encounter. Counseled patient on symptoms, examination findings, lab findings, imaging results, treatment decisions and monitoring and prognosis. The patient understood the recommendations and agrees with the treatment plan. All questions regarding treatment plan were fully answered.  Tyler Wolf [place], MD  09/10/23    History of Present Illness Tyler Wolf is a 67 y.o. year old male who presents for evaluation of Type II diabetes mellitus.  Tyler Wolf was first diagnosed in 2009. Diabetes education +  Home diabetes regimen: Humulin 70/30 47 units before breakfast and 27 units before supper Metformin XR 500 mg 2 tablets bid Jardiance 10 mg every day   BLOOD SUGAR DATA  CGM interpretation: At today's visit, we reviewed her CGM downloads. The full report is scanned in the media. Reviewing the CGM trends, BG are elevated in daytime and low overnight.  Physical Exam  BP (!) 140/100   Pulse 86   Ht 5\' 7"  (1.702 m)   Wt 173 lb 3.2 oz (78.6 kg)   SpO2 97%   BMI 27.13 kg/m    Constitutional: well developed, well nourished Head: normocephalic, atraumatic Eyes: sclera anicteric, no redness Neck: supple Lungs: normal respiratory effort Neurology: alert and oriented Skin: dry, no appreciable rashes Musculoskeletal: no appreciable defects Psychiatric: normal mood and affect Diabetic Foot Exam - Simple   No data filed      Current Medications Patient's Medications  New Prescriptions   DULAGLUTIDE (TRULICITY) 0.75 MG/0.5ML SOAJ    Inject 0.75 mg into the skin once a week.   EMPAGLIFLOZIN-METFORMIN HCL ER (SYNJARDY XR) 12.02-999 MG TB24    Take 2 tablets by mouth 2 (two) times daily.  Previous Medications   ASPIRIN 81 MG TABLET    Take 81 mg by mouth daily.   BACITRACIN 500 UNIT/GM OINTMENT    Apply 1 application topically 2 (two) times daily.   CHOLECALCIFEROL (VITAMIN  D3) 25 MCG (1000 UNIT) TABLET    Take 2,000 Units by mouth daily.   CONTINUOUS GLUCOSE RECEIVER (DEXCOM G7 RECEIVER) DEVI    1 Device by Does not apply route continuous.   CONTINUOUS GLUCOSE SENSOR (DEXCOM G7 SENSOR) MISC    Apply a new sensor every 10 days   CYCLOBENZAPRINE (FLEXERIL) 10 MG TABLET    Take 1 tablet (10 mg total) by mouth 2 (two) times daily as needed for muscle spasms.   GLUCOSE BLOOD (CONTOUR NEXT TEST) TEST STRIP    1 each by Other route 2 (two) times daily. And lancets 2/day   INSULIN ISOPHANE & REGULAR HUMAN KWIKPEN (HUMULIN 70/30 KWIKPEN) (70-30) 100 UNIT/ML KWIKPEN    INJECT 45 UNITS BEFORE BREAKFAST AND 25 UNITS BEFORE DINNER   INSULIN PEN NEEDLE 31G X 5 MM MISC    Used to inject insulin   INSULIN SYRINGE-NEEDLE U-100 (INSULIN SYRINGE 1CC/30GX5/16") 30G X 5/16" 1 ML MISC    1 Syringe by Does not  apply route daily. Use to inject insulin daily   LOSARTAN (COZAAR) 100 MG TABLET    Take 1 tablet (100 mg total) by mouth daily.   ROSUVASTATIN (CRESTOR) 40 MG TABLET    Take 1 tablet (40 mg total) by mouth daily.   TIZANIDINE (ZANAFLEX) 2 MG CAPSULE    Take 1 capsule (2 mg total) by mouth 3 (three) times daily.  Modified Medications   No medications on file  Discontinued Medications   EMPAGLIFLOZIN (JARDIANCE) 10 MG TABS TABLET    Take 1 tablet (10 mg total) by mouth daily before breakfast.   METFORMIN (GLUCOPHAGE-XR) 500 MG 24 HR TABLET    Take 2 tablets (1,000 mg total) by mouth 2 (two) times daily with a meal.    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  Social History Social History   Socioeconomic History   Marital status: Married    Spouse name: Not on file   Number of children: Not on file   Years of education: Not on file   Highest education level: Not on file  Occupational History   Not on file  Tobacco Use   Smoking status: Every Day    Current packs/day: 0.55    Average packs/day: 0.6 packs/day for 51.0 years (28.1 ttl pk-yrs)    Types: Cigarettes    Passive exposure: Never   Smokeless tobacco: Never   Tobacco comments:    electronic cigarettes, 6/1/2020pt states on and off  Vaping Use   Vaping status: Never Used  Substance and Sexual Activity   Alcohol use: No    Alcohol/week: 0.0 standard drinks of alcohol   Drug use: No   Sexual activity: Not Currently    Birth control/protection: None  Other Topics Concern   Not on file  Social History Narrative   Not on file   Social Determinants of Health   Financial Resource Strain: Low Risk  (04/03/2023)   Overall Financial Resource Strain (CARDIA)    Difficulty of Paying Living Expenses: Not hard at all  Food Insecurity: No Food Insecurity (04/03/2023)   Hunger Vital Sign    Worried About Running Out of Food in the Last Year: Never true    Ran Out of Food in the Last Year: Never true  Transportation Needs: No Transportation Needs (04/03/2023)   PRAPARE - Administrator, Civil Service (Medical): No    Lack of Transportation (Non-Medical): No  Physical Activity: Sufficiently Active (04/03/2023)   Exercise Vital Sign    Days of Exercise per Week: 2 days    Minutes of Exercise per Session: 110 min  Stress: No Stress Concern Present (04/03/2023)   [employer] of Occupational Health - Occupational Stress Questionnaire    Feeling of Stress : Not at all  Social Connections: Moderately Isolated (04/03/2023)   Social Connection and Isolation Panel [NHANES]     Frequency of Communication with Friends and Family: Once a week    Frequency  of Social Gatherings with Friends and Family: Twice a week    Attends Religious Services: Never    Database administrator or Organizations: No    Attends Banker Meetings: Never    Marital Status: Married  Catering manager Violence: Not At Risk (04/03/2023)   Humiliation, Afraid, Rape, and Kick questionnaire    Fear of Current or Ex-Partner: No    Emotionally Abused: No    Physically Abused: No    Sexually Abused: No    Lab Results  Component Value Date   HGBA1C 8.3 (A) 09/10/2023   HGBA1C 13.4 05/22/2023   HGBA1C 11.6 (H) 02/26/2023   Lab Results  Component Value Date   CHOL 189 08/16/2023   Lab Results  Component Value Date   HDL 42.30 08/16/2023   Lab Results  Component Value Date   LDLCALC 118 (H) 08/16/2023   Lab Results  Component Value Date   TRIG 141.0 08/16/2023   Lab Results  Component Value Date   CHOLHDL 4 08/16/2023   Lab Results  Component Value Date   CREATININE 0.82 08/16/2023   Lab Results  Component Value Date   GFR 91.90 08/16/2023   Lab Results  Component Value Date   MICROALBUR 2.2 (H) 11/13/2022      Component Value Date/Time   NA 141 08/16/2023 1218   NA 142 02/26/2023 0935   K 4.3 08/16/2023 1218   CL 104 08/16/2023 1218   CO2 28 08/16/2023 1218   GLUCOSE 172 (H) 08/16/2023 1218   GLUCOSE 94 09/10/2006 1325   BUN 17 08/16/2023 1218   BUN 15 02/26/2023 0935   CREATININE 0.82 08/16/2023 1218   CALCIUM 9.5 08/16/2023 1218   PROT 7.2 08/16/2023 1218   PROT 7.4 02/26/2023 0935   ALBUMIN 4.5 08/16/2023 1218   ALBUMIN 4.6 02/26/2023 0935   AST 20 08/16/2023 1218   ALT 28 08/16/2023 1218   ALKPHOS 60 08/16/2023 1218   BILITOT 0.5 08/16/2023 1218   BILITOT 0.5 02/26/2023 0935   GFRNONAA 93 02/12/2020 0939   GFRAA 107 02/12/2020 0939      Latest Ref Rng & Units 08/16/2023   12:18 PM 02/26/2023    9:35 AM 01/15/2023    8:26 AM  BMP   Glucose 70 - 99 mg/dL 951  884  166   BUN 6 - 23 mg/dL 17  15  20    Creatinine 0.40 - 1.50 mg/dL 0.63  0.16  0.10   BUN/Creat Ratio 10 - 24  21    Sodium 135 - 145 mEq/L 141  142  132   Potassium 3.5 - 5.1 mEq/L 4.3  4.6  4.0   Chloride 96 - 112 mEq/L 104  101  98   CO2 19 - 32 mEq/L 28  23  29    Calcium 8.4 - 10.5 mg/dL 9.5  9.8  9.7        Component Value Date/Time   WBC 9.2 02/26/2023 0935   WBC 6.8 07/01/2018 0906   RBC 5.32 02/26/2023 0935   RBC 4.99 07/01/2018 0906   HGB 16.4 02/26/2023 0935   HCT 47.4 02/26/2023 0935   PLT 256 02/26/2023 0935   MCV 89 02/26/2023 0935   MCH 30.8 02/26/2023 0935   MCHC 34.6 02/26/2023 0935   MCHC 34.7 07/01/2018 0906   RDW 12.9 02/26/2023 0935   LYMPHSABS 1.4 02/26/2023 0935   MONOABS 0.6 07/01/2018 0906   EOSABS 0.2 02/26/2023 0935   BASOSABS 0.0 02/26/2023 0935  Parts of this note may have been dictated using voice recognition software. There may be variances in spelling and vocabulary which are unintentional. Not all errors are proofread. Please notify the Thereasa Parkin if any discrepancies are noted or if the meaning of any statement is not clear.

## 2023-09-11 ENCOUNTER — Other Ambulatory Visit: Payer: Self-pay

## 2023-09-17 ENCOUNTER — Other Ambulatory Visit: Payer: Self-pay

## 2023-09-19 ENCOUNTER — Other Ambulatory Visit: Payer: Self-pay

## 2023-09-24 ENCOUNTER — Telehealth: Payer: Self-pay

## 2023-09-24 ENCOUNTER — Encounter: Payer: Self-pay | Admitting: Family Medicine

## 2023-09-24 ENCOUNTER — Ambulatory Visit (INDEPENDENT_AMBULATORY_CARE_PROVIDER_SITE_OTHER): Payer: Medicare HMO | Admitting: Family Medicine

## 2023-09-24 ENCOUNTER — Other Ambulatory Visit (HOSPITAL_COMMUNITY): Payer: Self-pay

## 2023-09-24 ENCOUNTER — Other Ambulatory Visit: Payer: Self-pay

## 2023-09-24 VITALS — BP 145/74 | HR 67 | Ht 67.0 in | Wt 170.1 lb

## 2023-09-24 DIAGNOSIS — Z7984 Long term (current) use of oral hypoglycemic drugs: Secondary | ICD-10-CM | POA: Diagnosis not present

## 2023-09-24 DIAGNOSIS — E1159 Type 2 diabetes mellitus with other circulatory complications: Secondary | ICD-10-CM | POA: Diagnosis not present

## 2023-09-24 DIAGNOSIS — I152 Hypertension secondary to endocrine disorders: Secondary | ICD-10-CM

## 2023-09-24 DIAGNOSIS — E1165 Type 2 diabetes mellitus with hyperglycemia: Secondary | ICD-10-CM | POA: Diagnosis not present

## 2023-09-24 MED ORDER — LOSARTAN POTASSIUM-HCTZ 100-12.5 MG PO TABS
1.0000 | ORAL_TABLET | Freq: Every day | ORAL | 1 refills | Status: DC
  Filled 2023-09-24: qty 90, 90d supply, fill #0
  Filled 2023-10-03: qty 90, 90d supply, fill #1
  Filled 2023-10-19 (×2): qty 30, 30d supply, fill #1
  Filled 2023-11-12: qty 30, 30d supply, fill #2
  Filled 2023-11-28 – 2023-12-05 (×2): qty 30, 30d supply, fill #3

## 2023-09-24 NOTE — Assessment & Plan Note (Signed)
Patient's A1c is much better, although still elevated.  After our previous visit I had put in a referral for community care management and with the help of the pharmacist, Dr. Clide Cliff, we set up all the patient's medications to be shipped to him through [facility].  This included breaking down the Synjardy into [place] and metformin due to cost.  Since that time [name] has been added back, which resulted in canceling of the metformin and Jardiance.  His [name] is a cost barrier to his compliance. he has not picked this medication up yet and is still taking the metformin and Jardiance separately,, so we have not been duplicating his medications thankfully.  Spoke with [name]ist over the phone today and fixed it that so [name] is taken off and metformin and Jardiance are put back on.  He is also prescribed Trulicity but does not appear he has received this yet.  This is likely due to the co-pay he needs to pay of $45.  His medications are mailed to him so he should not have had to pick it up.  Also somehow his most recent Jardiance refill on 12/4 was sent to [place].  We are fixing this as well - Metformin XR 1000 twice daily - Jardiance 10 mg daily - Starting Trulicity.  Patient will likely need to pay his co-pay before it will be shipped to him.  Has not started yet.  Will need to call patient and advise him that he needs to submit the co-pay so that he can receive the medication. - He is wearing the continuous glucose monitor from [facility] and he does appear to be much more compliant.  His improved A1c reflects this. - Will update his endocrinologist regarding these changes.

## 2023-09-24 NOTE — Telephone Encounter (Signed)
-----   Message from Sandre Kitty sent at 09/24/2023  9:47 AM EST ----- Can we call [place] [facility] and tell them that we are canceling the Jardiance if there are any refills left on it?  He is going to get Jardiance from [facility], which is where he gets all of his other medications.

## 2023-09-24 NOTE — Progress Notes (Unsigned)
Established Patient Office Visit  Subjective   Patient ID: Tyler Wolf, male    DOB: 1955-11-01  Age: 67 y.o. MRN: [id number]  Chief Complaint  Patient presents with   Medical Management of Chronic Issues    HPI  DM2  -patient is wearing his Dexcom today.  He has it connected to his app.  He understands how to use it.  We discussed his medications.  Seems to be some confusion regarding taking metformin and Jardiance versus Synjardy.  He denies receiving the Trulicity yet.  States "nobody called him about it".    HTN -patient's blood pressure elevated.  He is okay with changing losartan to a medication that contains losartan and hydrochlorothiazide.  Will send this through [employer].   The 10-year ASCVD risk score (Arnett DK, et al., 2019) is: 45.5%  Health Maintenance Due  Topic Date Due   Hepatitis C Screening  Never done   Pneumonia Vaccine 87+ Years old (2 of 2 - PCV) 10/18/2013   COVID-19 Vaccine (3 - 2023-24 season) 06/17/2023      Objective:     BP (!) 145/74   Pulse 67   Ht 5\' 7"  (1.702 m)   Wt 170 lb 1.9 oz (77.2 kg)   SpO2 99%   BMI 26.64 kg/m    Physical Exam General: Alert, oriented Pulmonary: No respiratory distress Psych: Pleasant affect   No results found for any visits on 09/24/23.      Assessment & Plan:   Poorly controlled diabetes mellitus (HCC)- mgt per Endo; Tyler Wolf Assessment & Plan: Patient's A1c is much better, although still elevated.  After our previous visit I had put in a referral for community care management and with the help of the pharmacist, Tyler Wolf, we set up Wolf the patient's medications to be shipped to him through [facility].  This included breaking down the Synjardy into [place] and metformin due to cost.  Since that time Tyler Wolf has been added back, which resulted in canceling of the metformin and Jardiance.  His Tyler Wolf is a cost barrier to his compliance. he has not picked this medication up yet  and is still taking the metformin and Jardiance separately,, so we have not been duplicating his medications thankfully.  Spoke with [facility] pharmacist over the phone today and fixed it that so Tyler Wolf is taken off and metformin and Jardiance are put back on.  He is also prescribed Trulicity but does not appear he has received this yet.  This is likely due to the co-pay he needs to pay of $45.  His medications are mailed to him so he should not have had to pick it up.  Also somehow his most recent Jardiance refill on 12/4 was sent to [place].  We are fixing this as well - Metformin XR 1000 twice daily - Jardiance 10 mg daily - Starting Trulicity.  Patient will likely need to pay his co-pay before it will be shipped to him.  Has not started yet.  Will need to call patient and advise him that he needs to submit the co-pay so that he can receive the medication. - He is wearing the continuous glucose monitor from [facility] and he does appear to be much more compliant.  His improved A1c reflects this. - Will update his endocrinologist regarding these changes.   Hypertension associated with type 2 diabetes mellitus (HCC) Assessment & Plan: Elevated today on repeat as well.  Appears to have been elevated at  his most recent endocrinologist visit as well.  Therefore we will change his losartan to losartan-HCTZ.  Advised him when he receives the new medication he should get rid of his losartan.  Will follow back up with him in 1 month to make sure he has done this and is compliant with the new medication.   Other orders -     Losartan Potassium-HCTZ; Take 1 tablet by mouth daily.  Dispense: 90 tablet; Refill: 1  I spent 35 minutes in the management of this patient   Return in about 4 weeks (around 10/22/2023) for HTN.    Tyler Kitty, MD

## 2023-09-24 NOTE — Telephone Encounter (Signed)
-----   Message from Sandre Kitty sent at 09/24/2023  1:13 PM EST ----- Regarding: medications Can we call the patient to let him know I have talked to the pharmacist at [facility] and he will be taking metformin and jardiance instead of synjardy.  I have changed it so the jardiance is refilled at [facility].  His trulicity will be shipped to him with his other medications but he needs to pay his copay first.  So he should call [facility] to find out the best way to pay his copay. Once that is done his trulicity should come in the mail to him.

## 2023-09-24 NOTE — Telephone Encounter (Signed)
Called pt he is advised of his Rx's that will be sent to [facility] and any question to contact the office

## 2023-09-24 NOTE — Telephone Encounter (Signed)
[facility]ed [facility] they are advised to cancel pt Jardiance going forward

## 2023-09-24 NOTE — Patient Instructions (Addendum)
It was nice to see you today,  We addressed the following topics today: - I am prescribing a new blood pressure medication.  I am adding this to the losartan that you are already taking.  Instead of taking losartan you will be taking the medicine that has losartan and hydrochlorothiazide in it.  When you pick up this new prescription you will need to throw away your losartan or put it somewhere where you do not mix it up - I am going to call [facility] to make sure your medications are straight and that we have not duplicated anything.  If needed I will let you know any changes we make - I am going to try to make sure that all your medications go through [facility], including the [facility].  Have a great day,  Frederic Jericho, MD

## 2023-09-24 NOTE — Assessment & Plan Note (Signed)
Elevated today on repeat as well.  Appears to have been elevated at his most recent endocrinologist visit as well.  Therefore we will change his losartan to losartan-HCTZ.  Advised him when he receives the new medication he should get rid of his losartan.  Will follow back up with him in 1 month to make sure he has done this and is compliant with the new medication.

## 2023-10-01 ENCOUNTER — Other Ambulatory Visit: Payer: Self-pay

## 2023-10-03 ENCOUNTER — Other Ambulatory Visit: Payer: Self-pay

## 2023-10-08 ENCOUNTER — Encounter: Payer: Self-pay | Admitting: "Endocrinology

## 2023-10-08 ENCOUNTER — Other Ambulatory Visit (HOSPITAL_COMMUNITY): Payer: Self-pay

## 2023-10-08 ENCOUNTER — Other Ambulatory Visit: Payer: Self-pay

## 2023-10-08 ENCOUNTER — Ambulatory Visit (INDEPENDENT_AMBULATORY_CARE_PROVIDER_SITE_OTHER): Payer: Medicare HMO | Admitting: "Endocrinology

## 2023-10-08 VITALS — BP 110/62 | HR 70 | Resp 20 | Ht 67.0 in | Wt 168.8 lb

## 2023-10-08 DIAGNOSIS — Z794 Long term (current) use of insulin: Secondary | ICD-10-CM | POA: Diagnosis not present

## 2023-10-08 DIAGNOSIS — Z7984 Long term (current) use of oral hypoglycemic drugs: Secondary | ICD-10-CM | POA: Diagnosis not present

## 2023-10-08 DIAGNOSIS — E1165 Type 2 diabetes mellitus with hyperglycemia: Secondary | ICD-10-CM | POA: Diagnosis not present

## 2023-10-08 DIAGNOSIS — E7800 Pure hypercholesterolemia, unspecified: Secondary | ICD-10-CM

## 2023-10-08 DIAGNOSIS — Z7985 Long-term (current) use of injectable non-insulin antidiabetic drugs: Secondary | ICD-10-CM | POA: Diagnosis not present

## 2023-10-08 MED ORDER — TRULICITY 1.5 MG/0.5ML ~~LOC~~ SOAJ
1.5000 mg | SUBCUTANEOUS | 3 refills | Status: DC
  Filled 2023-10-08 – 2023-10-19 (×2): qty 2, 28d supply, fill #0
  Filled 2023-11-29 – 2024-01-08 (×3): qty 2, 28d supply, fill #1
  Filled 2024-01-21 – 2024-01-29 (×2): qty 2, 28d supply, fill #2
  Filled 2024-02-07 – 2024-02-19 (×3): qty 2, 28d supply, fill #3

## 2023-10-08 NOTE — Progress Notes (Signed)
Outpatient Endocrinology Note Tyler [place], MD  10/08/23   Tyler Wolf August 26, 1956 [id number]  Referring Provider: Sandre Kitty, MD Primary Care Provider: Sandre Kitty, MD Reason for consultation: Subjective   Assessment & Plan  Diagnoses and all orders for this visit:  Uncontrolled type 2 diabetes mellitus with hyperglycemia (HCC)  Long term (current) use of oral hypoglycemic drugs  Long-term insulin use (HCC)  Long-term (current) use of injectable non-insulin antidiabetic drugs  Pure hypercholesterolemia  Other orders -     Dulaglutide (TRULICITY) 1.5 MG/0.5ML SOAJ; Inject 1.5 mg into the skin once a week.   Diabetes Type II complicated by hyperglycemia Lab Results  Component Value Date   GFR 91.90 08/16/2023   Hba1c goal less than 7, current Hba1c is  Lab Results  Component Value Date   HGBA1C 8.3 (A) 09/10/2023   Will recommend the following: Humulin 70/30 insulin: 45 units before breakfast and 25 units before supper Synjardy 12.5/1000mg  bid  Trulicity 1.5 mg/week STOP Metformin XR 500 mg 2 tablets bid STOP Jardiance 10 mg every day  Patient given written instructions so he is not confused about taking all 3 (Metformin, Jardiance and Synjardy) Has DexCom   No known contraindications to any of above medications No history of MEN syndrome/medullary thyroid cancer/pancreatitis or pancreatic cancer in self or family  -Last LD and Tg are as follows: Lab Results  Component Value Date   LDLCALC 118 (H) 08/16/2023    Lab Results  Component Value Date   TRIG 141.0 08/16/2023   -rosuvastatin 40 mg every day since 08/2023 -Follow low fat diet and exercise   -Blood pressure goal <140/90 - Microalbumin/creatinine goal is < 30 -Last MA/Cr is as follows: Lab Results  Component Value Date   MICROALBUR 2.2 (H) 11/13/2022   - on ACE/ARB losartan 100 mg qd -diet changes including salt restriction -limit eating outside -counseled BP targets per  standards of diabetes care -uncontrolled blood pressure can lead to retinopathy, nephropathy and cardiovascular and atherosclerotic heart disease  Reviewed and counseled on: -A1C target -Blood sugar targets -Complications of uncontrolled diabetes  -Checking blood sugar before meals and bedtime and bring log next visit -All medications with mechanism of action and side effects -Hypoglycemia management: rule of 15's, Glucagon Emergency Kit and medical alert ID -low-carb low-fat plate-method diet -At least 20 minutes of physical activity per day -Annual dilated retinal eye exam and foot exam -compliance and follow up needs -follow up as scheduled or earlier if problem gets worse  Call if blood sugar is less than 70 or consistently above 250    Take a 15 gm snack of carbohydrate at bedtime before you go to sleep if your blood sugar is less than 100.    If you are going to fast after midnight for a test or procedure, ask your physician for instructions on how to reduce/decrease your insulin dose.    Call if blood sugar is less than 70 or consistently above 250  -Treating a low sugar by rule of 15  (15 gms of sugar every 15 min until sugar is more than 70) If you feel your sugar is low, test your sugar to be sure If your sugar is low (less than 70), then take 15 grams of a fast acting Carbohydrate (3-4 glucose tablets or glucose gel or 4 ounces of juice or regular soda) Recheck your sugar 15 min after treating low to make sure it is more than 70 If sugar is still  less than 70, treat again with 15 grams of carbohydrate          Don't drive the hour of hypoglycemia  If unconscious/unable to eat or drink by mouth, use glucagon injection or nasal spray baqsimi and call 911. Can repeat again in 15 min if still unconscious.  Return in about 3 months (around 01/06/2024).   I have reviewed current medications, nurse's notes, allergies, vital signs, past medical and surgical history, family  medical history, and social history for this encounter. Counseled patient on symptoms, examination findings, lab findings, imaging results, treatment decisions and monitoring and prognosis. The patient understood the recommendations and agrees with the treatment plan. All questions regarding treatment plan were fully answered.  Tyler [place], MD  10/08/23    History of Present Illness Tyler Wolf is a 67 y.o. year old male who presents for evaluation of Type II diabetes mellitus.  BOY FABRY was first diagnosed in 2009. Diabetes education +  Home diabetes regimen: Humulin 70/30 insulin: 45 units before breakfast and 25 units before supper Metformin XR 500 mg 2 tablets bid Jardiance 10 mg every day  Synjardy 12.5/1000mg  every day (patient is not sure if he is taking it)  Trulicity 0.75 mg/week  BLOOD SUGAR DATA  CGM interpretation: At today's visit, we reviewed her CGM downloads. The full report is scanned in the media. Reviewing the CGM trends, BG are elevated in daytime.  Physical Exam  BP 110/62 (BP Location: Left Arm, Patient Position: Sitting, Cuff Size: Normal)   Pulse 70   Resp 20   Ht 5\' 7"  (1.702 m)   Wt 168 lb 12.8 oz (76.6 kg)   SpO2 99%   BMI 26.44 kg/m    Constitutional: well developed, well nourished Head: normocephalic, atraumatic Eyes: sclera anicteric, no redness Neck: supple Lungs: normal respiratory effort Neurology: alert and oriented Skin: dry, no appreciable rashes Musculoskeletal: no appreciable defects Psychiatric: normal mood and affect Diabetic Foot Exam - Simple   No data filed      Current Medications Patient's Medications  New Prescriptions   DULAGLUTIDE (TRULICITY) 1.5 MG/0.5ML SOAJ    Inject 1.5 mg into the skin once a week.  Previous Medications   ASPIRIN 81 MG TABLET    Take 81 mg by mouth daily.   CHOLECALCIFEROL (VITAMIN D3) 25 MCG (1000 UNIT) TABLET    Take 2,000 Units by mouth daily.   CONTINUOUS GLUCOSE RECEIVER (DEXCOM  G7 RECEIVER) DEVI    1 Device by Does not apply route continuous.   CONTINUOUS GLUCOSE SENSOR (DEXCOM G7 SENSOR) MISC    Apply a new sensor every 10 days   CYCLOBENZAPRINE (FLEXERIL) 10 MG TABLET    Take 1 tablet (10 mg total) by mouth 2 (two) times daily as needed for muscle spasms.   EMPAGLIFLOZIN (JARDIANCE) 10 MG TABS TABLET    Take 1 tablet (10 mg total) by mouth daily before breakfast.   EMPAGLIFLOZIN-METFORMIN HCL ER (SYNJARDY XR) 12.02-999 MG TB24    Take 2 tablets by mouth 2 (two) times daily.   GLUCOSE BLOOD (CONTOUR NEXT TEST) TEST STRIP    1 each by Other route 2 (two) times daily. And lancets 2/day   INSULIN ISOPHANE & REGULAR HUMAN KWIKPEN (HUMULIN 70/30 KWIKPEN) (70-30) 100 UNIT/ML KWIKPEN    INJECT 45 UNITS BEFORE BREAKFAST AND 25 UNITS BEFORE DINNER   INSULIN PEN NEEDLE 31G X 5 MM MISC    Used to inject insulin   INSULIN SYRINGE-NEEDLE U-100 (INSULIN SYRINGE 1CC/30GX5/16")  30G X 5/16" 1 ML MISC    1 Syringe by Does not apply route daily. Use to inject insulin daily   JARDIANCE 10 MG TABS TABLET    Take 10 mg by mouth every morning.   LOSARTAN-HYDROCHLOROTHIAZIDE (HYZAAR) 100-12.5 MG TABLET    Take 1 tablet by mouth daily.   METFORMIN (GLUCOPHAGE-XR) 500 MG 24 HR TABLET    Take 2 tablets (1,000 mg total) by mouth 2 (two) times daily with a meal.   ROSUVASTATIN (CRESTOR) 40 MG TABLET    Take 1 tablet (40 mg total) by mouth daily.  Modified Medications   No medications on file  Discontinued Medications   DULAGLUTIDE (TRULICITY) 0.75 MG/0.5ML SOAJ    Inject 0.75 mg into the skin once a week.    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  Social History Social History   Socioeconomic History   Marital status: Married    Spouse name: Not on file   Number of children: Not on file   Years of education: Not on file   Highest education level: Not on file  Occupational History   Not on file  Tobacco Use   Smoking status: Every Day    Current packs/day: 0.55    Average packs/day: 0.6 packs/day for 51.0 years (28.1 ttl pk-yrs)    Types: Cigarettes    Passive exposure: Never   Smokeless tobacco: Never   Tobacco comments:    electronic cigarettes, 6/1/2020pt states on and off  Vaping Use   Vaping status: Never Used  Substance and Sexual Activity   Alcohol use: No    Alcohol/week: 0.0 standard drinks of alcohol   Drug use: No   Sexual activity: Not Currently    Birth control/protection: None  Other Topics Concern   Not on file  Social History Narrative   Not on file   Social Drivers of Health   Financial Resource Strain: Low Risk  (04/03/2023)   Overall Financial Resource Strain (CARDIA)    Difficulty of Paying Living Expenses: Not hard at all  Food Insecurity: No Food Insecurity (04/03/2023)   Hunger Vital Sign    Worried About Running Out of Food in the Last Year: Never true    Ran Out of Food in the Last Year: Never true  Transportation Needs: No Transportation Needs (04/03/2023)   PRAPARE - Administrator, Civil Service (Medical): No    Lack of Transportation (Non-Medical): No  Physical Activity: Sufficiently Active (04/03/2023)   Exercise Vital Sign    Days of Exercise per Week: 2 days    Minutes of Exercise per Session: 110 min  Stress: No Stress Concern Present (04/03/2023)   [employer] of Occupational Health - Occupational Stress Questionnaire    Feeling of Stress : Not at all  Social Connections: Moderately Isolated  (04/03/2023)   Social Connection and Isolation Panel [NHANES]    Frequency of Communication with Friends and Family: Once a week  Frequency of Social Gatherings with Friends and Family: Twice a week    Attends Religious Services: Never    Database administrator or Organizations: No    Attends Banker Meetings: Never    Marital Status: Married  Catering manager Violence: Not At Risk (04/03/2023)   Humiliation, Afraid, Rape, and Kick questionnaire    Fear of Current or Ex-Partner: No    Emotionally Abused: No    Physically Abused: No    Sexually Abused: No    Lab Results  Component Value Date   HGBA1C 8.3 (A) 09/10/2023   HGBA1C 13.4 05/22/2023   HGBA1C 11.6 (H) 02/26/2023   Lab Results  Component Value Date   CHOL 189 08/16/2023   Lab Results  Component Value Date   HDL 42.30 08/16/2023   Lab Results  Component Value Date   LDLCALC 118 (H) 08/16/2023   Lab Results  Component Value Date   TRIG 141.0 08/16/2023   Lab Results  Component Value Date   CHOLHDL 4 08/16/2023   Lab Results  Component Value Date   CREATININE 0.82 08/16/2023   Lab Results  Component Value Date   GFR 91.90 08/16/2023   Lab Results  Component Value Date   MICROALBUR 2.2 (H) 11/13/2022      Component Value Date/Time   NA 141 08/16/2023 1218   NA 142 02/26/2023 0935   K 4.3 08/16/2023 1218   CL 104 08/16/2023 1218   CO2 28 08/16/2023 1218   GLUCOSE 172 (H) 08/16/2023 1218   GLUCOSE 94 09/10/2006 1325   BUN 17 08/16/2023 1218   BUN 15 02/26/2023 0935   CREATININE 0.82 08/16/2023 1218   CALCIUM 9.5 08/16/2023 1218   PROT 7.2 08/16/2023 1218   PROT 7.4 02/26/2023 0935   ALBUMIN 4.5 08/16/2023 1218   ALBUMIN 4.6 02/26/2023 0935   AST 20 08/16/2023 1218   ALT 28 08/16/2023 1218   ALKPHOS 60 08/16/2023 1218   BILITOT 0.5 08/16/2023 1218   BILITOT 0.5 02/26/2023 0935   GFRNONAA 93 02/12/2020 0939   GFRAA 107 02/12/2020 0939      Latest Ref Rng & Units 08/16/2023    12:18 PM 02/26/2023    9:35 AM 01/15/2023    8:26 AM  BMP  Glucose 70 - 99 mg/dL 161  096  045   BUN 6 - 23 mg/dL 17  15  20    Creatinine 0.40 - 1.50 mg/dL 4.09  8.11  9.14   BUN/Creat Ratio 10 - 24  21    Sodium 135 - 145 mEq/L 141  142  132   Potassium 3.5 - 5.1 mEq/L 4.3  4.6  4.0   Chloride 96 - 112 mEq/L 104  101  98   CO2 19 - 32 mEq/L 28  23  29    Calcium 8.4 - 10.5 mg/dL 9.5  9.8  9.7        Component Value Date/Time   WBC 9.2 02/26/2023 0935   WBC 6.8 07/01/2018 0906   RBC 5.32 02/26/2023 0935   RBC 4.99 07/01/2018 0906   HGB 16.4 02/26/2023 0935   HCT 47.4 02/26/2023 0935   PLT 256 02/26/2023 0935   MCV 89 02/26/2023 0935   MCH 30.8 02/26/2023 0935   MCHC 34.6 02/26/2023 0935   MCHC 34.7 07/01/2018 0906   RDW 12.9 02/26/2023 0935   LYMPHSABS 1.4 02/26/2023 0935   MONOABS 0.6 07/01/2018 0906   EOSABS 0.2 02/26/2023 0935   BASOSABS 0.0 02/26/2023 0935  Parts of this note may have been dictated using voice recognition software. There may be variances in spelling and vocabulary which are unintentional. Not all errors are proofread. Please notify the Thereasa Parkin if any discrepancies are noted or if the meaning of any statement is not clear.

## 2023-10-08 NOTE — Patient Instructions (Signed)

## 2023-10-09 ENCOUNTER — Other Ambulatory Visit: Payer: Self-pay

## 2023-10-12 ENCOUNTER — Other Ambulatory Visit: Payer: Self-pay

## 2023-10-19 ENCOUNTER — Other Ambulatory Visit: Payer: Self-pay

## 2023-10-19 ENCOUNTER — Other Ambulatory Visit: Payer: Self-pay | Admitting: Family Medicine

## 2023-10-19 ENCOUNTER — Other Ambulatory Visit (HOSPITAL_BASED_OUTPATIENT_CLINIC_OR_DEPARTMENT_OTHER): Payer: Self-pay

## 2023-10-19 ENCOUNTER — Other Ambulatory Visit (HOSPITAL_COMMUNITY): Payer: Self-pay

## 2023-10-19 DIAGNOSIS — E1165 Type 2 diabetes mellitus with hyperglycemia: Secondary | ICD-10-CM

## 2023-10-19 MED ORDER — HUMULIN 70/30 KWIKPEN (70-30) 100 UNIT/ML ~~LOC~~ SUPN
PEN_INJECTOR | SUBCUTANEOUS | 5 refills | Status: DC
  Filled 2023-10-19: qty 15, 22d supply, fill #0
  Filled 2024-01-08: qty 15, 22d supply, fill #1
  Filled 2024-01-21 – 2024-01-29 (×2): qty 15, 22d supply, fill #2
  Filled 2024-02-07 – 2024-02-15 (×2): qty 15, 22d supply, fill #3
  Filled 2024-03-12: qty 15, 22d supply, fill #4
  Filled 2024-06-10: qty 15, 22d supply, fill #5

## 2023-10-23 ENCOUNTER — Other Ambulatory Visit: Payer: Self-pay

## 2023-10-25 ENCOUNTER — Ambulatory Visit (INDEPENDENT_AMBULATORY_CARE_PROVIDER_SITE_OTHER): Payer: PPO | Admitting: Family Medicine

## 2023-10-25 VITALS — BP 138/79 | HR 86 | Temp 98.5°F | Ht 69.0 in | Wt 168.2 lb

## 2023-10-25 DIAGNOSIS — I152 Hypertension secondary to endocrine disorders: Secondary | ICD-10-CM

## 2023-10-25 DIAGNOSIS — E1159 Type 2 diabetes mellitus with other circulatory complications: Secondary | ICD-10-CM | POA: Diagnosis not present

## 2023-10-25 DIAGNOSIS — E1165 Type 2 diabetes mellitus with hyperglycemia: Secondary | ICD-10-CM

## 2023-10-25 DIAGNOSIS — Z23 Encounter for immunization: Secondary | ICD-10-CM | POA: Diagnosis not present

## 2023-10-25 DIAGNOSIS — Z7984 Long term (current) use of oral hypoglycemic drugs: Secondary | ICD-10-CM | POA: Diagnosis not present

## 2023-10-25 NOTE — Progress Notes (Signed)
   Established Patient Office Visit  Subjective   Patient ID: Tyler Wolf, male    DOB: [birth year]  Age: 68 y.o. MRN: [id number]  Chief Complaint  Patient presents with   Hypertension    HPI  Hypertension-patient is taking the new medication as it is in the pill packs that he gets monthly.  DM2-patient taking his diabetes medications.  Has received the Trulicity .  Injects it weekly.  No questions or concerns regarding his medications.    Patient agreeable to getting pneumococcal vaccine today.  Patient was counseled on risks/benefits.  Information was provided for him in the form of a handout in his discharge paperwork.   The 10-year ASCVD risk score (Arnett DK, et al., 2019) is: 42.6%  Health Maintenance Due  Topic Date Due   Hepatitis C Screening  Never done   COVID-19 Vaccine (3 - 2024-25 season) 06/17/2023      Objective:     BP 138/79   Pulse 86   Temp 98.5 F (36.9 C) (Oral)   Ht 5' 9 (1.753 m)   Wt 168 lb 4 oz (76.3 kg)   SpO2 97%   BMI 24.85 kg/m    Physical Exam General: Alert, oriented Pulmonary: No respiratory stress Psych: Pleasant affect.   No results found for any visits on 10/25/23.      Assessment & Plan:   Hypertension associated with type 2 diabetes mellitus (HCC) Assessment & Plan: Patient's blood pressure better today after addition of HCTZ to his losartan .  No side effects or complaints.  Uses adherence pill packs to help with compliance.  Continue current dose.  Follow-up in 3 months.   Need for pneumococcal vaccination -     Pneumococcal conjugate vaccine 20-valent  Poorly controlled diabetes mellitus (HCC)- mgt per Endo; Dr Kassie Assessment & Plan: Patient has received his Trulicity  now and is administering it weekly.  Saw his endocrinologist last month.  Has follow-up with them in March.  Will follow-up with me after his next endocrinology appointment      Return in about 3 months (around 01/23/2024) for DM.     [name], MD

## 2023-10-25 NOTE — Assessment & Plan Note (Signed)
 Patient's blood pressure better today after addition of HCTZ to his losartan.  No side effects or complaints.  Uses adherence pill packs to help with compliance.  Continue current dose.  Follow-up in 3 months.

## 2023-10-25 NOTE — Patient Instructions (Signed)
 It was nice to see you today,  We addressed the following topics today: -Your blood pressure was better today.  Continue with the medications you are currently on. - Next time I see you in April I would like you to bring your pill pack box with you so that we can make sure it is accurate. - We have given the pneumococcal vaccine today.  There is information about this vaccine in this handout.  Muscle soreness is the most common side effect at the site of injection.  You can take Tylenol  for pain or fever over the next 2 days.  Have a great day,  Rolan Slain, MD

## 2023-10-25 NOTE — Assessment & Plan Note (Signed)
 Patient has received his Trulicity now and is administering it weekly.  Saw his endocrinologist last month.  Has follow-up with them in March.  Will follow-up with me after his next endocrinology appointment

## 2023-11-08 ENCOUNTER — Other Ambulatory Visit (HOSPITAL_COMMUNITY): Payer: Self-pay

## 2023-11-08 ENCOUNTER — Telehealth: Payer: Self-pay

## 2023-11-08 NOTE — Telephone Encounter (Signed)
Pharmacy Patient Advocate Encounter   Received notification from Pt Calls Messages that prior authorization for Trulicity is required/requested.   Insurance verification completed.   The patient is insured through [facility] ADVANTAGE/RX ADVANCE .   Per test claim: PA required; PA submitted to above mentioned insurance via CoverMyMeds Key/confirmation #/[id number] [facility] Status is pending

## 2023-11-08 NOTE — Telephone Encounter (Signed)
Per patient received message stating that Trulicity needs a PA to continue using past the limited  supply provided to patient.

## 2023-11-12 ENCOUNTER — Other Ambulatory Visit: Payer: Self-pay

## 2023-11-12 ENCOUNTER — Other Ambulatory Visit (HOSPITAL_COMMUNITY): Payer: Self-pay

## 2023-11-12 NOTE — Telephone Encounter (Signed)
Pharmacy Patient Advocate Encounter  Received notification from [facility] ADVANTAGE/RX ADVANCE that Prior Authorization for Trulicity has been APPROVED through 11/07/24   PA #/Case ID/Reference #: [id number]

## 2023-11-13 ENCOUNTER — Other Ambulatory Visit: Payer: Self-pay

## 2023-11-13 NOTE — Telephone Encounter (Signed)
PA decison given to patient, no further questions at this time

## 2023-11-23 ENCOUNTER — Other Ambulatory Visit (HOSPITAL_COMMUNITY): Payer: Self-pay

## 2023-11-23 ENCOUNTER — Other Ambulatory Visit: Payer: Self-pay

## 2023-11-23 ENCOUNTER — Other Ambulatory Visit: Payer: Self-pay | Admitting: "Endocrinology

## 2023-11-23 DIAGNOSIS — E1165 Type 2 diabetes mellitus with hyperglycemia: Secondary | ICD-10-CM

## 2023-11-23 MED ORDER — DEXCOM G7 SENSOR MISC
1.0000 | 0 refills | Status: DC
  Filled 2023-11-23: qty 9, 90d supply, fill #0

## 2023-11-27 DIAGNOSIS — H43823 Vitreomacular adhesion, bilateral: Secondary | ICD-10-CM | POA: Diagnosis not present

## 2023-11-27 DIAGNOSIS — E119 Type 2 diabetes mellitus without complications: Secondary | ICD-10-CM | POA: Diagnosis not present

## 2023-11-27 DIAGNOSIS — H2513 Age-related nuclear cataract, bilateral: Secondary | ICD-10-CM | POA: Diagnosis not present

## 2023-11-27 LAB — HM DIABETES EYE EXAM

## 2023-11-28 ENCOUNTER — Other Ambulatory Visit: Payer: Self-pay

## 2023-11-29 ENCOUNTER — Other Ambulatory Visit: Payer: Self-pay

## 2023-11-29 ENCOUNTER — Other Ambulatory Visit (HOSPITAL_COMMUNITY): Payer: Self-pay

## 2023-11-29 DIAGNOSIS — H2513 Age-related nuclear cataract, bilateral: Secondary | ICD-10-CM | POA: Diagnosis not present

## 2023-11-29 DIAGNOSIS — H40033 Anatomical narrow angle, bilateral: Secondary | ICD-10-CM | POA: Diagnosis not present

## 2023-11-30 ENCOUNTER — Other Ambulatory Visit: Payer: Self-pay

## 2023-11-30 ENCOUNTER — Other Ambulatory Visit (HOSPITAL_COMMUNITY): Payer: Self-pay

## 2023-12-02 ENCOUNTER — Other Ambulatory Visit (HOSPITAL_COMMUNITY): Payer: Self-pay

## 2023-12-03 ENCOUNTER — Telehealth: Payer: Self-pay

## 2023-12-03 ENCOUNTER — Other Ambulatory Visit: Payer: Self-pay

## 2023-12-03 ENCOUNTER — Other Ambulatory Visit (HOSPITAL_COMMUNITY): Payer: Self-pay

## 2023-12-03 DIAGNOSIS — E1165 Type 2 diabetes mellitus with hyperglycemia: Secondary | ICD-10-CM

## 2023-12-03 MED ORDER — FREESTYLE LIBRE 3 PLUS SENSOR MISC
1.0000 | 3 refills | Status: AC
  Filled 2023-12-03: qty 6, 90d supply, fill #0
  Filled 2024-02-07 – 2024-02-15 (×2): qty 6, 90d supply, fill #1
  Filled 2024-05-05: qty 6, 90d supply, fill #2
  Filled 2024-07-03 – 2024-07-16 (×3): qty 6, 90d supply, fill #3

## 2023-12-03 NOTE — Addendum Note (Signed)
Addended by: Tera Partridge on: 12/03/2023 03:03 PM   Modules accepted: Orders

## 2023-12-03 NOTE — Telephone Encounter (Signed)
 PA needed for Dexcom G7.

## 2023-12-03 NOTE — Telephone Encounter (Signed)
Pharmacy Patient Advocate Encounter   Received notification from Pt Calls Messages that prior authorization for Dexcom G7 sensor is required/requested.   Insurance verification completed.   The patient is insured through [facility] ADVANTAGE/RX ADVANCE .   Per test claim:  Freestyle libre 3 plus is preferred by the insurance.  If suggested medication is appropriate, Please send in a new RX and discontinue this one. If not, please advise as to why it's not appropriate so that we may request a Prior Authorization. Please note, some preferred medications may still require a PA.  If the suggested medications have not been trialed and there are no contraindications to their use, the PA will not be submitted, as it will not be approved.

## 2023-12-03 NOTE — Telephone Encounter (Signed)
Spoke with patient, agrees to the switch. New Rx called in to pharmacy.

## 2023-12-03 NOTE — Telephone Encounter (Signed)
Patient called and made aware PA request has been sent to PA team.

## 2023-12-04 ENCOUNTER — Other Ambulatory Visit (HOSPITAL_COMMUNITY): Payer: Self-pay

## 2023-12-05 ENCOUNTER — Other Ambulatory Visit: Payer: Self-pay

## 2023-12-05 ENCOUNTER — Other Ambulatory Visit (HOSPITAL_COMMUNITY): Payer: Self-pay

## 2023-12-06 ENCOUNTER — Other Ambulatory Visit: Payer: Self-pay

## 2023-12-10 ENCOUNTER — Other Ambulatory Visit: Payer: Self-pay | Admitting: "Endocrinology

## 2023-12-10 ENCOUNTER — Other Ambulatory Visit: Payer: Self-pay | Admitting: Family Medicine

## 2023-12-10 ENCOUNTER — Other Ambulatory Visit (HOSPITAL_COMMUNITY): Payer: Self-pay

## 2023-12-10 ENCOUNTER — Other Ambulatory Visit: Payer: Self-pay

## 2023-12-10 DIAGNOSIS — E1165 Type 2 diabetes mellitus with hyperglycemia: Secondary | ICD-10-CM

## 2023-12-10 MED ORDER — SYNJARDY XR 12.5-1000 MG PO TB24
1.0000 | ORAL_TABLET | Freq: Two times a day (BID) | ORAL | 5 refills | Status: DC
  Filled 2024-01-01: qty 60, 30d supply, fill #0
  Filled 2024-01-21 – 2024-01-29 (×4): qty 60, 30d supply, fill #1
  Filled 2024-02-07 – 2024-02-26 (×2): qty 60, 30d supply, fill #2
  Filled 2024-03-17 – 2024-04-10 (×2): qty 60, 30d supply, fill #3
  Filled 2024-05-08: qty 60, 30d supply, fill #4
  Filled 2024-06-10: qty 60, 30d supply, fill #5

## 2023-12-10 MED ORDER — LOSARTAN POTASSIUM-HCTZ 100-12.5 MG PO TABS
1.0000 | ORAL_TABLET | Freq: Every day | ORAL | 1 refills | Status: DC
  Filled 2024-01-01: qty 30, 30d supply, fill #0
  Filled 2024-01-21 – 2024-01-29 (×4): qty 30, 30d supply, fill #1
  Filled 2024-02-07 – 2024-02-26 (×2): qty 30, 30d supply, fill #2
  Filled 2024-03-17 – 2024-04-10 (×2): qty 30, 30d supply, fill #3
  Filled 2024-05-08: qty 30, 30d supply, fill #4
  Filled 2024-06-10: qty 30, 30d supply, fill #5

## 2023-12-10 NOTE — Telephone Encounter (Signed)
 Original Rx looks like it has refill however I contacted pharmacy to confirm and it does not. Will send refill.

## 2023-12-11 DIAGNOSIS — G8929 Other chronic pain: Secondary | ICD-10-CM | POA: Diagnosis not present

## 2023-12-11 DIAGNOSIS — H259 Unspecified age-related cataract: Secondary | ICD-10-CM | POA: Diagnosis not present

## 2023-12-11 DIAGNOSIS — Z7982 Long term (current) use of aspirin: Secondary | ICD-10-CM | POA: Diagnosis not present

## 2023-12-11 DIAGNOSIS — E1136 Type 2 diabetes mellitus with diabetic cataract: Secondary | ICD-10-CM | POA: Diagnosis not present

## 2023-12-11 DIAGNOSIS — E1142 Type 2 diabetes mellitus with diabetic polyneuropathy: Secondary | ICD-10-CM | POA: Diagnosis not present

## 2023-12-11 DIAGNOSIS — Z794 Long term (current) use of insulin: Secondary | ICD-10-CM | POA: Diagnosis not present

## 2023-12-11 DIAGNOSIS — E663 Overweight: Secondary | ICD-10-CM | POA: Diagnosis not present

## 2023-12-11 DIAGNOSIS — E1169 Type 2 diabetes mellitus with other specified complication: Secondary | ICD-10-CM | POA: Diagnosis not present

## 2023-12-11 DIAGNOSIS — E785 Hyperlipidemia, unspecified: Secondary | ICD-10-CM | POA: Diagnosis not present

## 2023-12-11 DIAGNOSIS — I10 Essential (primary) hypertension: Secondary | ICD-10-CM | POA: Diagnosis not present

## 2023-12-11 DIAGNOSIS — F17210 Nicotine dependence, cigarettes, uncomplicated: Secondary | ICD-10-CM | POA: Diagnosis not present

## 2023-12-24 ENCOUNTER — Telehealth: Payer: Self-pay | Admitting: Family Medicine

## 2023-12-24 NOTE — Telephone Encounter (Signed)
 Copied from CRM [id number]. Topic: General - Other >> Dec 24, 2023 11:07 AM Turkey B wrote: Reason for CRM: pt called in to let us know his surgeon will be calling in for questions

## 2023-12-31 ENCOUNTER — Other Ambulatory Visit (HOSPITAL_COMMUNITY): Payer: Self-pay

## 2024-01-01 ENCOUNTER — Other Ambulatory Visit (HOSPITAL_COMMUNITY): Payer: Self-pay

## 2024-01-01 ENCOUNTER — Other Ambulatory Visit: Payer: Self-pay

## 2024-01-02 ENCOUNTER — Other Ambulatory Visit: Payer: Self-pay

## 2024-01-07 ENCOUNTER — Encounter: Payer: Self-pay | Admitting: "Endocrinology

## 2024-01-07 ENCOUNTER — Ambulatory Visit (INDEPENDENT_AMBULATORY_CARE_PROVIDER_SITE_OTHER): Payer: Medicare HMO | Admitting: "Endocrinology

## 2024-01-07 VITALS — BP 130/80 | HR 58 | Ht 69.0 in | Wt 164.4 lb

## 2024-01-07 DIAGNOSIS — E7800 Pure hypercholesterolemia, unspecified: Secondary | ICD-10-CM | POA: Diagnosis not present

## 2024-01-07 DIAGNOSIS — Z794 Long term (current) use of insulin: Secondary | ICD-10-CM | POA: Diagnosis not present

## 2024-01-07 DIAGNOSIS — Z7985 Long-term (current) use of injectable non-insulin antidiabetic drugs: Secondary | ICD-10-CM | POA: Diagnosis not present

## 2024-01-07 DIAGNOSIS — E1165 Type 2 diabetes mellitus with hyperglycemia: Secondary | ICD-10-CM | POA: Diagnosis not present

## 2024-01-07 DIAGNOSIS — Z7984 Long term (current) use of oral hypoglycemic drugs: Secondary | ICD-10-CM | POA: Diagnosis not present

## 2024-01-07 LAB — POCT GLYCOSYLATED HEMOGLOBIN (HGB A1C): Hemoglobin A1C: 7 % — AB (ref 4.0–5.6)

## 2024-01-07 NOTE — Progress Notes (Signed)
 Outpatient Endocrinology Note Tyler Orion, MD  01/07/24   Tyler Wolf [birth year] [id number]  Referring Provider: Sandre Kitty, MD Primary Care Provider: Sandre Kitty, MD Reason for consultation: Subjective   Assessment & Plan  Diagnoses and all orders for this visit:  Uncontrolled type 2 diabetes mellitus with hyperglycemia (HCC) -     POCT glycosylated hemoglobin (Hb A1C) -     Lipid panel -     Microalbumin / creatinine urine ratio   Diabetes Type II complicated by hyperglycemia Lab Results  Component Value Date   GFR 91.90 08/16/2023   Hba1c goal less than 7, current Hba1c is  Lab Results  Component Value Date   HGBA1C 7.0 (A) 01/07/2024   Will recommend the following: Humulin 70/30 insulin: 30 units before breakfast and 22 units before supper 15 min before meals  Synjardy 12.5/1000mg  bid  Trulicity 1.5 mg/week Add a walk after dinner  Has DexCom   No known contraindications to any of above medications No history of MEN syndrome/medullary thyroid cancer/pancreatitis or pancreatic cancer in self or family  -Last LD and Tg are as follows: Lab Results  Component Value Date   LDLCALC 118 (H) 08/16/2023    Lab Results  Component Value Date   TRIG 141.0 08/16/2023   -rosuvastatin 40 mg every day since 08/2023-repeat before next visit  -Follow low fat diet and exercise   -Blood pressure goal <140/90 - Microalbumin/creatinine goal is < 30 -Last MA/Cr is as follows: Lab Results  Component Value Date   MICROALBUR 2.2 (H) 11/13/2022   - on ACE/ARB losartan 100 mg qd -diet changes including salt restriction -limit eating outside -counseled BP targets per standards of diabetes care -uncontrolled blood pressure can lead to retinopathy, nephropathy and cardiovascular and atherosclerotic heart disease  Reviewed and counseled on: -A1C target -Blood sugar targets -Complications of uncontrolled diabetes  -Checking blood sugar before meals and  bedtime and bring log next visit -All medications with mechanism of action and side effects -Hypoglycemia management: rule of 15's, Glucagon Emergency Kit and medical alert ID -low-carb low-fat plate-method diet -At least 20 minutes of physical activity per day -Annual dilated retinal eye exam and foot exam -compliance and follow up needs -follow up as scheduled or earlier if problem gets worse  Call if blood sugar is less than 70 or consistently above 250    Take a 15 gm snack of carbohydrate at bedtime before you go to sleep if your blood sugar is less than 100.    If you are going to fast after midnight for a test or procedure, ask your physician for instructions on how to reduce/decrease your insulin dose.    Call if blood sugar is less than 70 or consistently above 250  -Treating a low sugar by rule of 15  (15 gms of sugar every 15 min until sugar is more than 70) If you feel your sugar is low, test your sugar to be sure If your sugar is low (less than 70), then take 15 grams of a fast acting Carbohydrate (3-4 glucose tablets or glucose gel or 4 ounces of juice or regular soda) Recheck your sugar 15 min after treating low to make sure it is more than 70 If sugar is still less than 70, treat again with 15 grams of carbohydrate          Don't drive the hour of hypoglycemia  If unconscious/unable to eat or drink by mouth, use glucagon injection or  nasal spray baqsimi and call 911. Can repeat again in 15 min if still unconscious.  Return in about 3 months (around 04/08/2024) for visit and 8 am labs before next visit.   I have reviewed current medications, nurse's notes, allergies, vital signs, past medical and surgical history, family medical history, and social history for this encounter. Counseled patient on symptoms, examination findings, lab findings, imaging results, treatment decisions and monitoring and prognosis. The patient understood the recommendations and agrees with the  treatment plan. All questions regarding treatment plan were fully answered.  Tyler [name], MD  01/07/24    History of Present Illness Tyler Wolf is a 68 y.o. year old male who presents for evaluation of Type II diabetes mellitus.  Tyler Wolf was first diagnosed in 2009. Diabetes education +  Home diabetes regimen: Humulin 70/30 insulin: 30 units before breakfast and 22 units before supper Synjardy 12.5/1000mg  twice a day Trulicity 1.5 mg/week  BLOOD SUGAR DATA  CGM interpretation: At today's visit, we reviewed her CGM downloads. The full report is scanned in the media. Reviewing the CGM trends, BG are mildly elevated after dinner  Physical Exam  BP 130/80   Pulse (!) 58   Ht 5\' 9"  (1.753 m)   Wt 164 lb 6.4 oz (74.6 kg)   SpO2 98%   BMI 24.28 kg/m    Constitutional: well developed, well nourished Head: normocephalic, atraumatic Eyes: sclera anicteric, no redness Neck: supple Lungs: normal respiratory effort Neurology: alert and oriented Skin: dry, no appreciable rashes Musculoskeletal: no appreciable defects Psychiatric: normal mood and affect Diabetic Foot Exam - Simple   No data filed      Current Medications Patient's Medications  New Prescriptions   No medications on file  Previous Medications   ASPIRIN 81 MG TABLET    Take 81 mg by mouth daily.   CHOLECALCIFEROL (VITAMIN D3) 25 MCG (1000 UNIT) TABLET    Take 2,000 Units by mouth daily.   CONTINUOUS GLUCOSE RECEIVER (DEXCOM G7 RECEIVER) DEVI    1 Device by Does not apply route continuous.   CONTINUOUS GLUCOSE SENSOR (DEXCOM G7 SENSOR) MISC    Apply a new sensor every 10 days   CONTINUOUS GLUCOSE SENSOR (FREESTYLE LIBRE 3 PLUS SENSOR) MISC    Inject 1 Device into the skin continuous. Change every 15 days   CYCLOBENZAPRINE (FLEXERIL) 10 MG TABLET    Take 1 tablet (10 mg total) by mouth 2 (two) times daily as needed for muscle spasms.   DULAGLUTIDE (TRULICITY) 1.5 MG/0.5ML SOAJ    Inject 1.5 mg into the  skin once a week.   EMPAGLIFLOZIN (JARDIANCE) 10 MG TABS TABLET    Take 1 tablet (10 mg total) by mouth daily before breakfast.   EMPAGLIFLOZIN-METFORMIN HCL ER (SYNJARDY XR) 12.02-999 MG TB24    Take 1 tablet by mouth 2 (two) times daily.   GLUCOSE BLOOD (CONTOUR NEXT TEST) TEST STRIP    1 each by Other route 2 (two) times daily. And lancets 2/day   INSULIN ISOPHANE & REGULAR HUMAN KWIKPEN (HUMULIN 70/30 KWIKPEN) (70-30) 100 UNIT/ML KWIKPEN    Inject 45 Units into the skin daily before breakfast AND 25 Units daily before supper.   INSULIN PEN NEEDLE 31G X 5 MM MISC    Used to inject insulin   INSULIN SYRINGE-NEEDLE U-100 (INSULIN SYRINGE 1CC/30GX5/16") 30G X 5/16" 1 ML MISC    1 Syringe by Does not apply route daily. Use to inject insulin daily   LOSARTAN-HYDROCHLOROTHIAZIDE (HYZAAR) 100-12.5  MG TABLET    Take 1 tablet by mouth daily.   ROSUVASTATIN (CRESTOR) 40 MG TABLET    Take 1 tablet (40 mg total) by mouth daily.  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  Social History Social History   Socioeconomic History   Marital status: Married    Spouse name: Not on file   Number of children: Not on file   Years of education: Not on file   Highest education level: Not on file  Occupational History   Not on file  Tobacco Use   Smoking status: Every Day    Current packs/day: 0.55     Average packs/day: 0.6 packs/day for 51.0 years (28.1 ttl pk-yrs)    Types: Cigarettes    Passive exposure: Never   Smokeless tobacco: Never   Tobacco comments:    electronic cigarettes, 6/1/2020pt states on and off  Vaping Use   Vaping status: Never Used  Substance and Sexual Activity   Alcohol use: No    Alcohol/week: 0.0 standard drinks of alcohol   Drug use: No   Sexual activity: Not Currently    Birth control/protection: None  Other Topics Concern   Not on file  Social History Narrative   Not on file   Social Drivers of Health   Financial Resource Strain: Low Risk  (04/03/2023)   Overall Financial Resource Strain (CARDIA)    Difficulty of Paying Living Expenses: Not hard at all  Food Insecurity: No Food Insecurity (04/03/2023)   Hunger Vital Sign    Worried About Running Out of Food in the Last Year: Never true    Ran Out of Food in the Last Year: Never true  Transportation Needs: No Transportation Needs (04/03/2023)   PRAPARE - Administrator, Civil Service (Medical): No    Lack of Transportation (Non-Medical): No  Physical Activity: Sufficiently Active (04/03/2023)   Exercise Vital Sign    Days of Exercise per Week: 2 days    Minutes of Exercise per Session: 110 min  Stress: No Stress Concern Present (04/03/2023)   [employer] of Occupational Health - Occupational Stress Questionnaire    Feeling of Stress : Not at all  Social Connections: Moderately Isolated (04/03/2023)   Social Connection and Isolation Panel [NHANES]    Frequency of Communication with Friends and Family: Once a week    Frequency of Social Gatherings with Friends and Family: Twice a week    Attends Religious Services: Never    Database administrator or Organizations: No    Attends Banker Meetings: Never    Marital Status: Married  Catering manager Violence: Not At Risk (04/03/2023)   Humiliation, Afraid, Rape, and Kick questionnaire    Fear of Current or Ex-Partner: No     Emotionally Abused: No    Physically Abused: No  Sexually Abused: No    Lab Results  Component Value Date   HGBA1C 7.0 (A) 01/07/2024   HGBA1C 8.3 (A) 09/10/2023   HGBA1C 13.4 05/22/2023   Lab Results  Component Value Date   CHOL 189 08/16/2023   Lab Results  Component Value Date   HDL 42.30 08/16/2023   Lab Results  Component Value Date   LDLCALC 118 (H) 08/16/2023   Lab Results  Component Value Date   TRIG 141.0 08/16/2023   Lab Results  Component Value Date   CHOLHDL 4 08/16/2023   Lab Results  Component Value Date   CREATININE 0.82 08/16/2023   Lab Results  Component Value Date   GFR 91.90 08/16/2023   Lab Results  Component Value Date   MICROALBUR 2.2 (H) 11/13/2022      Component Value Date/Time   NA 141 08/16/2023 1218   NA 142 02/26/2023 0935   K 4.3 08/16/2023 1218   CL 104 08/16/2023 1218   CO2 28 08/16/2023 1218   GLUCOSE 172 (H) 08/16/2023 1218   GLUCOSE 94 09/10/2006 1325   BUN 17 08/16/2023 1218   BUN 15 02/26/2023 0935   CREATININE 0.82 08/16/2023 1218   CALCIUM 9.5 08/16/2023 1218   PROT 7.2 08/16/2023 1218   PROT 7.4 02/26/2023 0935   ALBUMIN 4.5 08/16/2023 1218   ALBUMIN 4.6 02/26/2023 0935   AST 20 08/16/2023 1218   ALT 28 08/16/2023 1218   ALKPHOS 60 08/16/2023 1218   BILITOT 0.5 08/16/2023 1218   BILITOT 0.5 02/26/2023 0935   GFRNONAA 93 02/12/2020 0939   GFRAA 107 02/12/2020 0939      Latest Ref Rng & Units 08/16/2023   12:18 PM 02/26/2023    9:35 AM 01/15/2023    8:26 AM  BMP  Glucose 70 - 99 mg/dL 409  811  914   BUN 6 - 23 mg/dL 17  15  20    Creatinine 0.40 - 1.50 mg/dL 7.82  9.56  2.13   BUN/Creat Ratio 10 - 24  21    Sodium 135 - 145 mEq/L 141  142  132   Potassium 3.5 - 5.1 mEq/L 4.3  4.6  4.0   Chloride 96 - 112 mEq/L 104  101  98   CO2 19 - 32 mEq/L 28  23  29    Calcium 8.4 - 10.5 mg/dL 9.5  9.8  9.7        Component Value Date/Time   WBC 9.2 02/26/2023 0935   WBC 6.8 07/01/2018 0906   RBC 5.32  02/26/2023 0935   RBC 4.99 07/01/2018 0906   HGB 16.4 02/26/2023 0935   HCT 47.4 02/26/2023 0935   PLT 256 02/26/2023 0935   MCV 89 02/26/2023 0935   MCH 30.8 02/26/2023 0935   MCHC 34.6 02/26/2023 0935   MCHC 34.7 07/01/2018 0906   RDW 12.9 02/26/2023 0935   LYMPHSABS 1.4 02/26/2023 0935   MONOABS 0.6 07/01/2018 0906   EOSABS 0.2 02/26/2023 0935   BASOSABS 0.0 02/26/2023 0935     Parts of this note may have been dictated using voice recognition software. There may be variances in spelling and vocabulary which are unintentional. Not all errors are proofread. Please notify the Thereasa Parkin if any discrepancies are noted or if the meaning of any statement is not clear.

## 2024-01-08 ENCOUNTER — Other Ambulatory Visit: Payer: Self-pay

## 2024-01-08 ENCOUNTER — Other Ambulatory Visit (HOSPITAL_COMMUNITY): Payer: Self-pay

## 2024-01-11 ENCOUNTER — Encounter: Payer: Self-pay | Admitting: "Endocrinology

## 2024-01-14 ENCOUNTER — Other Ambulatory Visit: Payer: Self-pay | Admitting: Family Medicine

## 2024-01-16 DIAGNOSIS — M25512 Pain in left shoulder: Secondary | ICD-10-CM | POA: Diagnosis not present

## 2024-01-16 DIAGNOSIS — S46012D Strain of muscle(s) and tendon(s) of the rotator cuff of left shoulder, subsequent encounter: Secondary | ICD-10-CM | POA: Diagnosis not present

## 2024-01-21 ENCOUNTER — Other Ambulatory Visit: Payer: Self-pay

## 2024-01-23 ENCOUNTER — Ambulatory Visit (INDEPENDENT_AMBULATORY_CARE_PROVIDER_SITE_OTHER): Payer: Self-pay | Admitting: Family Medicine

## 2024-01-23 ENCOUNTER — Encounter: Payer: Self-pay | Admitting: Family Medicine

## 2024-01-23 ENCOUNTER — Other Ambulatory Visit (HOSPITAL_COMMUNITY): Payer: Self-pay

## 2024-01-23 VITALS — BP 127/79 | HR 74 | Ht 69.0 in | Wt 163.8 lb

## 2024-01-23 DIAGNOSIS — E782 Mixed hyperlipidemia: Secondary | ICD-10-CM | POA: Diagnosis not present

## 2024-01-23 DIAGNOSIS — E1169 Type 2 diabetes mellitus with other specified complication: Secondary | ICD-10-CM | POA: Diagnosis not present

## 2024-01-23 DIAGNOSIS — Z794 Long term (current) use of insulin: Secondary | ICD-10-CM | POA: Diagnosis not present

## 2024-01-23 DIAGNOSIS — E1159 Type 2 diabetes mellitus with other circulatory complications: Secondary | ICD-10-CM | POA: Diagnosis not present

## 2024-01-23 DIAGNOSIS — I152 Hypertension secondary to endocrine disorders: Secondary | ICD-10-CM | POA: Diagnosis not present

## 2024-01-23 DIAGNOSIS — E1165 Type 2 diabetes mellitus with hyperglycemia: Secondary | ICD-10-CM

## 2024-01-23 NOTE — Patient Instructions (Addendum)
 It was nice to see you today,  We addressed the following topics today: -Congratulations on getting your blood sugar lower.  Continue with your current medications. - I am going to check your cholesterol level and kidney function.  I will let you know the results of this when I get them. - If you are looking to try a medication to help with sleep at night, first try over-the-counter melatonin at 4 to 5mg .  Do not take more than this.  You may need to take less.  Some people do well with just 1 or 2 mg.  Have a great day,  Frederic Jericho, MD

## 2024-01-23 NOTE — Progress Notes (Unsigned)
   Established Patient Office Visit  Subjective   Patient ID: Tyler Wolf, male    DOB: 03-Aug-1956  Age: 68 y.o. MRN: [id number]  Chief Complaint  Patient presents with   Medical Management of Chronic Issues    HPI  DM2-patient is taking his medications.  His Trulicity, Synjardy and insulin 70-30.  He takes 30 units / 22 units of the insulin twice a day.  Still gets his medications in the pill pack form from [facility].  He is using a freestyle libre currently.  We discussed linking it up to our account.  He agreed to this.  He goes to the diabetic [facility] care center with Dr. Luciana Axe and went to last February for diabetic eye exam.  He has a surgery upcoming on the 16th.  He is currently holding his aspirin.    The 10-year ASCVD risk score (Arnett DK, et al., 2019) is: 38%  Health Maintenance Due  Topic Date Due   Hepatitis C Screening  Never done   COVID-19 Vaccine (3 - 2024-25 season) 06/17/2023      Objective:     BP 127/79   Pulse 74   Ht 5\' 9"  (1.753 m)   Wt 163 lb 12.8 oz (74.3 kg)   SpO2 98%   BMI 24.19 kg/m  {Vitals History (Optional):23777}  Physical Exam General: Alert, oriented Pulmonary: No respiratory distress Psych: Pleasant affect   No results found for any visits on 01/23/24.      Assessment & Plan:   There are no diagnoses linked to this encounter.   Return in about 3 months (around 04/23/2024) for DM, hld, HTN.    Sandre Kitty, MD

## 2024-01-24 ENCOUNTER — Other Ambulatory Visit: Payer: Self-pay

## 2024-01-24 LAB — LIPID PANEL
Chol/HDL Ratio: 3.6 ratio (ref 0.0–5.0)
Cholesterol, Total: 171 mg/dL (ref 100–199)
HDL: 47 mg/dL (ref >39)
LDL Chol Calc (NIH): 105 mg/dL — ABNORMAL HIGH (ref 0–99)
Triglycerides: 105 mg/dL (ref 0–149)
VLDL Cholesterol Cal: 19 mg/dL (ref 5–40)

## 2024-01-24 LAB — BASIC METABOLIC PANEL WITH GFR
BUN/Creatinine Ratio: 27 — ABNORMAL HIGH (ref 10–24)
BUN: 21 mg/dL (ref 8–27)
CO2: 24 mmol/L (ref 20–29)
Calcium: 9.7 mg/dL (ref 8.6–10.2)
Chloride: 103 mmol/L (ref 96–106)
Creatinine, Ser: 0.78 mg/dL (ref 0.76–1.27)
Glucose: 89 mg/dL (ref 70–99)
Potassium: 4.9 mmol/L (ref 3.5–5.2)
Sodium: 143 mmol/L (ref 134–144)
eGFR: 98 mL/min/{1.73_m2} (ref >59)

## 2024-01-25 ENCOUNTER — Other Ambulatory Visit: Payer: Self-pay

## 2024-01-25 NOTE — Assessment & Plan Note (Signed)
 Repeat cholesterol level today.  Continue rosuvastatin.

## 2024-01-25 NOTE — Assessment & Plan Note (Signed)
 At goal.  Continue losartan-HCTZ.  Getting BMP today.

## 2024-01-25 NOTE — Assessment & Plan Note (Addendum)
 Congratulated the patient on his improvement in his A1c.  Is currently taking Synjardy, Trulicity and 70/30 insulin at a dose of 30 units a.m., 22 units p.m.  Still getting his medications through the pill pack at [facility].  He now wears a libre instead of a Dexcom and so we linked up his [facility] account to ours.  Goes to diabetic eye care center with Dr. Luciana Axe and is up-to-date.  Continue management per endocrinology.

## 2024-01-29 ENCOUNTER — Other Ambulatory Visit (HOSPITAL_COMMUNITY): Payer: Self-pay

## 2024-01-29 ENCOUNTER — Other Ambulatory Visit: Payer: Self-pay

## 2024-01-30 DIAGNOSIS — M25712 Osteophyte, left shoulder: Secondary | ICD-10-CM | POA: Diagnosis not present

## 2024-01-30 DIAGNOSIS — M19012 Primary osteoarthritis, left shoulder: Secondary | ICD-10-CM | POA: Diagnosis not present

## 2024-01-30 DIAGNOSIS — M75102 Unspecified rotator cuff tear or rupture of left shoulder, not specified as traumatic: Secondary | ICD-10-CM | POA: Diagnosis not present

## 2024-01-30 DIAGNOSIS — G8918 Other acute postprocedural pain: Secondary | ICD-10-CM | POA: Diagnosis not present

## 2024-02-05 ENCOUNTER — Telehealth: Payer: Self-pay | Admitting: Family Medicine

## 2024-02-05 ENCOUNTER — Ambulatory Visit (INDEPENDENT_AMBULATORY_CARE_PROVIDER_SITE_OTHER)

## 2024-02-05 ENCOUNTER — Ambulatory Visit: Admission: EM | Admit: 2024-02-05 | Discharge: 2024-02-05 | Disposition: A | Discharge status: Home / Self Care

## 2024-02-05 DIAGNOSIS — R0789 Other chest pain: Secondary | ICD-10-CM

## 2024-02-05 DIAGNOSIS — S2241XA Multiple fractures of ribs, right side, initial encounter for closed fracture: Secondary | ICD-10-CM | POA: Diagnosis not present

## 2024-02-05 LAB — POCT URINALYSIS DIP (MANUAL ENTRY)
Bilirubin, UA: NEGATIVE
Blood, UA: NEGATIVE
Glucose, UA: 500 mg/dL — AB
Leukocytes, UA: NEGATIVE
Nitrite, UA: NEGATIVE
Protein Ur, POC: NEGATIVE mg/dL
Spec Grav, UA: 1.025 (ref 1.010–1.025)
Urobilinogen, UA: 0.2 U/dL
pH, UA: 5.5 (ref 5.0–8.0)

## 2024-02-05 NOTE — ED Triage Notes (Signed)
 Last Glucose (at time of intake): 97

## 2024-02-05 NOTE — Telephone Encounter (Signed)
 Notify patient that his chest x-ray was read by radiologist and showed 2 rib fractures involving the right sixth and seventh posterolateral ribs.  Continue to wear band and take anti-inflammatories and pain medication as prescribed.  It can take up to several weeks for rib fracture to heal.  If you continue to experience pain not improving with current medication follow-up with your primary care doctor.

## 2024-02-05 NOTE — ED Notes (Signed)
 Universal rib belt placed on patient with instruction.

## 2024-02-05 NOTE — Discharge Instructions (Addendum)
 As discussed unable to evaluate the cause of your syncopal episode 3 days ago.  If any symptoms of dizziness or weakness develop would recommend evaluation in the emergency department.  Right rib and chest x-ray shows a questionable undisplaced fracture involving the ninth rib however radiology review is still pending.  Our office will follow-up with you via phone once the x-ray has been read by the radiologist which will likely be later on this evening. Treatment for rib fracture is pain management and compression.  Continue with current anti-inflammatories and resume pain medication only if pain is severe.  Continue taking the Tylenol .  Move rib belt at bedtime this can restrict reading.  Avoid laying on your right side until pain subsides.

## 2024-02-05 NOTE — ED Triage Notes (Signed)
"  I was lying in bed last night, sneezed and then had this pain in my mid chest with something popping and now deep breath's are hurting me, anything at all seems to start/continue this pain". "He just had left shoulder replacement surgery recently, he also did pass out Saturday, he went to see his neighbor, passed out, loss of consciousness and woke back up".

## 2024-02-05 NOTE — ED Provider Notes (Signed)
 [place], hypertension, [facility]    CSN: [id number] Arrival date & time: 02/05/24  0909      History   Chief Complaint Chief Complaint  Patient presents with   Chest Pain   Loss of Consciousness    HPI Tyler Wolf is a 68 y.o. male.   Patient with a history of 2 diabetes, hypertension, daily smoker, hyperlipidemia, presents initially reporting chest pain and LOC on 02/02/2024. Patient did experience a syncopal episodes 3 days ago.  He reports he was outside with his son and began to feel lightheaded, sweaty went to sit down while his son went to go get the car and he only recalls his son awakening him and he does not remember what precipitated prior to patient passing out.  Not checked his blood sugar although checked his blood pressure which was normal.  He did not seek any evaluation.  He initially injured that he was experiencing chest pain in the kiosk during check-in however on further clarification he reports he is experiencing pain below his right breast and lateral right chest wall since last night or sneezing and coughing.  Ports he experienced a stabbing sensation as if it was piercing his chest wall.  Concerned that he may have fractured a rib.  Denies any known injury associated with his syncopal episode.  Denies any shortness of breath endorses if he takes a deep breath in he can experience the pain on the right lower chest wall.  Denies any recent upper respiratory illness.  Is not taking anything for pain exception of Celebrex which he was prescribed for anti-inflammatory treatment of her recent shoulder surgery. 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   Age-related nuclear cataract of both eyes 11/09/2020   Vitamin D insufficiency 02/12/2020   Poorly controlled diabetes mellitus (HCC)- mgt per Endo; Dr Washington Hacker 12/26/2019   Healthcare maintenance 08/27/2018   Popliteal cyst, left 07/03/2017   Diabetes (HCC) 06/04/2014   Hyperlipidemia, mixed 05/27/2007   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 Authorizing Provider  Acetaminophen  Extra Strength 500 MG CAPS Take 2 capsules by mouth every 8 (eight) hours. 01/30/24  Yes [provider]  aspirin 81 MG tablet Take 81 mg by mouth daily.   Yes [provider]  celecoxib (CELEBREX) 100 MG capsule Take 100 mg by mouth 2 (two) times daily. 01/30/24  Yes [provider]  cholecalciferol (VITAMIN D3) 25 MCG (1000 UNIT) tablet Take 2,000 Units by mouth daily.   Yes [provider]  cyclobenzaprine  (FLEXERIL ) 10 MG tablet Take 1 tablet (10 mg total) by mouth 2 (two) times daily as needed for muscle spasms. 07/24/22  Yes Vernestine Gondola, PA-C  Dulaglutide  (TRULICITY ) 1.5 MG/0.5ML [place]  Inject 1.5 mg into the skin once a week. 10/08/23  Yes Motwani, Komal, MD  empagliflozin  (JARDIANCE ) 10 MG TABS tablet Take 1 tablet (10 mg total) by mouth daily before breakfast. 08/02/23  Yes Laneta Pintos, MD  Empagliflozin -metFORMIN  HCl ER (SYNJARDY  XR) 12.02-999 MG TB24 Take 1 tablet by mouth 2 (two) times daily. 12/10/23  Yes Motwani, Komal, MD  insulin  isophane & regular human KwikPen (HUMULIN  70/30 KWIKPEN) (70-30) 100 UNIT/ML KwikPen Inject 45 Units into  the skin daily before breakfast AND 25 Units daily before supper. Patient taking differently: Inject 45 Units into the skin daily before breakfast AND 25 Units daily before supper. Currently doing 20 in AM and 20 in Evening. 10/19/23  Yes Laneta Pintos, MD  Insulin  Pen Needle 31G X 5 MM MISC Used to inject insulin  03/22/22  Yes Lajean Pike, MD  Insulin  Syringe-Needle U-100 (INSULIN  SYRINGE 1CC/30GX5/16") 30G X 5/16" 1 ML MISC 1 Syringe by Does not apply route daily. Use to inject insulin  daily 04/03/19  Yes Gwyndolyn Lerner, MD  losartan -hydrochlorothiazide  (HYZAAR ) 100-12.5 MG tablet Take 1 tablet by mouth daily. 12/10/23  Yes Laneta Pintos, MD  ondansetron (ZOFRAN-ODT) 4 MG disintegrating tablet Take 4 mg by mouth every 8 (eight) hours as needed. 01/30/24  Yes [provider]  oxyCODONE  (OXY IR/ROXICODONE ) 5 MG immediate release tablet Take 5-10 mg by mouth every 6 (six) hours as needed. 01/30/24  Yes [provider]  rosuvastatin  (CRESTOR ) 40 MG tablet Take 1 tablet (40 mg total) by mouth daily. 08/17/23  Yes Motwani, Komal, MD  Continuous Glucose Sensor (FREESTYLE LIBRE 3 PLUS SENSOR) MISC Inject 1 Device into the skin continuous. Change every 15 days 12/03/23   Motwani, Joanna Muck, MD  glucose blood (CONTOUR NEXT TEST) test strip 1 each by Other route 2 (two) times daily. And lancets 2/day 03/22/22   Lajean Pike, MD    Family History Family History  Problem Relation Age of Onset   Diabetes Mother    Heart attack Father    Diabetes Father    Colon polyps Neg Hx    Colon cancer Neg Hx    Esophageal cancer Neg Hx     Social History Social History   Tobacco Use   Smoking status: Every Day    Current packs/day: 0.55    Average packs/day: 0.6 packs/day for 51.0 years (28.1 ttl pk-yrs)    Types: Cigarettes    Passive exposure: Never   Smokeless tobacco: Never   Tobacco comments:    electronic cigarettes, 6/1/2020pt states on and off  Vaping Use   Vaping status: Never Used   Substance Use Topics   Alcohol use: No    Alcohol/week: 0.0 standard drinks of alcohol   Drug use: No     Allergies   Latex   Review of Systems Review of Systems  Cardiovascular:  Positive for chest pain.     Physical Exam Triage Vital Signs ED Triage Vitals  Encounter Vitals Group     BP 02/05/24 0959 (!) 159/76     Systolic BP Percentile --      Diastolic BP Percentile --      Pulse Rate 02/05/24 0959 86     Resp 02/05/24 0959 20     Temp 02/05/24 0959 97.6 F (36.4 C)     Temp Source 02/05/24 0959 Oral     SpO2 02/05/24 0959 95 %     Weight 02/05/24 0955 163 lb 12.8 oz (74.3 kg)  Height 02/05/24 0955 5\' 7"  (1.702 m)     Head Circumference --      Peak Flow --      Pain Score 02/05/24 0952 3     Pain Loc --      Pain Education --      Exclude from Growth Chart --    No data found.  Updated Vital Signs BP (!) 159/76 (BP Location: Right Arm)   Pulse 86   Temp 97.6 F (36.4 C) (Oral)   Resp 20   Ht 5\' 7"  (1.702 m)   Wt 163 lb 12.8 oz (74.3 kg)   SpO2 95%   BMI 25.66 kg/m   Visual Acuity Right Eye Distance:   Left Eye Distance:   Bilateral Distance:    Right Eye Near:   Left Eye Near:    Bilateral Near:     Physical Exam Vitals reviewed.  Constitutional:      General: He is not in acute distress.    Appearance: He is not toxic-appearing.  HENT:     Head: Normocephalic and atraumatic.  Eyes:     Extraocular Movements: Extraocular movements intact.     Pupils: Pupils are equal, round, and reactive to light.  Cardiovascular:     Rate and Rhythm: Normal rate and regular rhythm.  Pulmonary:     Effort: Pulmonary effort is normal.     Breath sounds: Normal breath sounds.  Neurological:     Mental Status: He is alert. Mental status is at baseline.     Gait: Gait abnormal (With standing and ambulating patient is complaining of lower lateral and anterior right chest wall comfort).      UC Treatments / Results  Labs (all labs ordered are  listed, but only abnormal results are displayed) Labs Reviewed  POCT URINALYSIS DIP (MANUAL ENTRY) - Abnormal; Notable for the following components:      Result Value   Glucose, UA =500 (*)    Ketones, POC UA moderate (40) (*)    All other components within normal limits    EKG   Radiology DG Ribs Unilateral W/Chest Right Result Date: 02/05/2024 CLINICAL DATA:  Right lower chest wall pain EXAM: RIGHT RIBS AND CHEST - 3+ VIEW COMPARISON:  None Available. FINDINGS: There is no evidence of pneumothorax or pleural effusion. Both lungs are clear. Heart size and mediastinal contours are within normal limits. Status post total left shoulder arthroplasty IMPRESSION: Fracture of the right sixth and seventh posterolateral ribs. No pneumothorax. No pleural effusions. Electronically Signed   By: Fredrich Jefferson M.D.   On: 02/05/2024 13:07    Procedures Procedures (including critical care time)  Medications Ordered in UC Medications - No data to display  Initial Impression / Assessment and Plan / UC Course  I have reviewed the triage vital signs and the nursing notes.  Pertinent labs & imaging results that were available during my care of the patient were reviewed by me and considered in my medical decision making (see chart for details).    Initial read of the right rib and chest imaging completed by this writer discussed with patient there is concern for rib fracture at the 8th and 9th rib however radiology confirmed rib fracture was at the 6th and 7th posterior lateral vertebrae.  Patient placed in a rib binder splint.  Encouraged to continue anti-inflammatories, deep breathing and take chronic pain medication only for severity of pain.  Advised to follow-up with primary care doctor if pain is not  subsiding over the next few weeks.  Educated that rib fractures to take an extensive time to completely heal however deep breathing and movement complications associated with rib fractures. Final Clinical  Impressions(s) / UC Diagnoses   Final diagnoses:  Right-sided chest wall pain     Discharge Instructions      As discussed unable to evaluate the cause of your syncopal episode 3 days ago.  If any symptoms of dizziness or weakness develop would recommend evaluation in the emergency department.  Right rib and chest x-ray shows a questionable undisplaced fracture involving the ninth rib however radiology review is still pending.  Our office will follow-up with you via phone once the x-ray has been read by the radiologist which will likely be later on this evening. Treatment for rib fracture is pain management and compression.  Continue with current anti-inflammatories and resume pain medication only if pain is severe.  Continue taking the Tylenol .  Move rib belt at bedtime this can restrict reading.  Avoid laying on your right side until pain subsides.     ED Prescriptions   None    PDMP not reviewed this encounter.   Buena Carmine, NP 02/07/24 1000

## 2024-02-06 DIAGNOSIS — M25612 Stiffness of left shoulder, not elsewhere classified: Secondary | ICD-10-CM | POA: Diagnosis not present

## 2024-02-06 DIAGNOSIS — M19012 Primary osteoarthritis, left shoulder: Secondary | ICD-10-CM | POA: Diagnosis not present

## 2024-02-06 DIAGNOSIS — M6281 Muscle weakness (generalized): Secondary | ICD-10-CM | POA: Diagnosis not present

## 2024-02-06 DIAGNOSIS — S46012D Strain of muscle(s) and tendon(s) of the rotator cuff of left shoulder, subsequent encounter: Secondary | ICD-10-CM | POA: Diagnosis not present

## 2024-02-07 ENCOUNTER — Other Ambulatory Visit: Payer: Self-pay

## 2024-02-08 DIAGNOSIS — M19012 Primary osteoarthritis, left shoulder: Secondary | ICD-10-CM | POA: Diagnosis not present

## 2024-02-11 DIAGNOSIS — M6281 Muscle weakness (generalized): Secondary | ICD-10-CM | POA: Diagnosis not present

## 2024-02-11 DIAGNOSIS — M19012 Primary osteoarthritis, left shoulder: Secondary | ICD-10-CM | POA: Diagnosis not present

## 2024-02-11 DIAGNOSIS — M25612 Stiffness of left shoulder, not elsewhere classified: Secondary | ICD-10-CM | POA: Diagnosis not present

## 2024-02-11 DIAGNOSIS — S46012D Strain of muscle(s) and tendon(s) of the rotator cuff of left shoulder, subsequent encounter: Secondary | ICD-10-CM | POA: Diagnosis not present

## 2024-02-15 ENCOUNTER — Other Ambulatory Visit: Payer: Self-pay

## 2024-02-15 ENCOUNTER — Other Ambulatory Visit (HOSPITAL_COMMUNITY): Payer: Self-pay

## 2024-02-18 ENCOUNTER — Other Ambulatory Visit: Payer: Self-pay

## 2024-02-19 DIAGNOSIS — M19012 Primary osteoarthritis, left shoulder: Secondary | ICD-10-CM | POA: Diagnosis not present

## 2024-02-19 DIAGNOSIS — M25612 Stiffness of left shoulder, not elsewhere classified: Secondary | ICD-10-CM | POA: Diagnosis not present

## 2024-02-19 DIAGNOSIS — M6281 Muscle weakness (generalized): Secondary | ICD-10-CM | POA: Diagnosis not present

## 2024-02-19 DIAGNOSIS — S46012D Strain of muscle(s) and tendon(s) of the rotator cuff of left shoulder, subsequent encounter: Secondary | ICD-10-CM | POA: Diagnosis not present

## 2024-02-25 ENCOUNTER — Other Ambulatory Visit: Payer: Self-pay

## 2024-02-26 ENCOUNTER — Other Ambulatory Visit: Payer: Self-pay

## 2024-02-26 DIAGNOSIS — M19012 Primary osteoarthritis, left shoulder: Secondary | ICD-10-CM | POA: Diagnosis not present

## 2024-02-26 DIAGNOSIS — M6281 Muscle weakness (generalized): Secondary | ICD-10-CM | POA: Diagnosis not present

## 2024-02-26 DIAGNOSIS — M25612 Stiffness of left shoulder, not elsewhere classified: Secondary | ICD-10-CM | POA: Diagnosis not present

## 2024-02-26 DIAGNOSIS — S46012D Strain of muscle(s) and tendon(s) of the rotator cuff of left shoulder, subsequent encounter: Secondary | ICD-10-CM | POA: Diagnosis not present

## 2024-02-29 DIAGNOSIS — M19012 Primary osteoarthritis, left shoulder: Secondary | ICD-10-CM | POA: Diagnosis not present

## 2024-03-04 DIAGNOSIS — M6281 Muscle weakness (generalized): Secondary | ICD-10-CM | POA: Diagnosis not present

## 2024-03-04 DIAGNOSIS — M19012 Primary osteoarthritis, left shoulder: Secondary | ICD-10-CM | POA: Diagnosis not present

## 2024-03-04 DIAGNOSIS — M25612 Stiffness of left shoulder, not elsewhere classified: Secondary | ICD-10-CM | POA: Diagnosis not present

## 2024-03-04 DIAGNOSIS — S46012D Strain of muscle(s) and tendon(s) of the rotator cuff of left shoulder, subsequent encounter: Secondary | ICD-10-CM | POA: Diagnosis not present

## 2024-03-11 DIAGNOSIS — M19012 Primary osteoarthritis, left shoulder: Secondary | ICD-10-CM | POA: Diagnosis not present

## 2024-03-11 DIAGNOSIS — M25612 Stiffness of left shoulder, not elsewhere classified: Secondary | ICD-10-CM | POA: Diagnosis not present

## 2024-03-11 DIAGNOSIS — S46012D Strain of muscle(s) and tendon(s) of the rotator cuff of left shoulder, subsequent encounter: Secondary | ICD-10-CM | POA: Diagnosis not present

## 2024-03-11 DIAGNOSIS — M6281 Muscle weakness (generalized): Secondary | ICD-10-CM | POA: Diagnosis not present

## 2024-03-12 ENCOUNTER — Other Ambulatory Visit (HOSPITAL_COMMUNITY): Payer: Self-pay

## 2024-03-12 ENCOUNTER — Other Ambulatory Visit: Payer: Self-pay

## 2024-03-17 ENCOUNTER — Other Ambulatory Visit: Payer: Self-pay

## 2024-03-17 ENCOUNTER — Other Ambulatory Visit: Payer: Self-pay | Admitting: "Endocrinology

## 2024-03-17 DIAGNOSIS — M19012 Primary osteoarthritis, left shoulder: Secondary | ICD-10-CM | POA: Diagnosis not present

## 2024-03-17 DIAGNOSIS — M25612 Stiffness of left shoulder, not elsewhere classified: Secondary | ICD-10-CM | POA: Diagnosis not present

## 2024-03-17 DIAGNOSIS — S46012D Strain of muscle(s) and tendon(s) of the rotator cuff of left shoulder, subsequent encounter: Secondary | ICD-10-CM | POA: Diagnosis not present

## 2024-03-17 DIAGNOSIS — M6281 Muscle weakness (generalized): Secondary | ICD-10-CM | POA: Diagnosis not present

## 2024-03-17 MED ORDER — TRULICITY 1.5 MG/0.5ML ~~LOC~~ SOAJ
1.5000 mg | SUBCUTANEOUS | 3 refills | Status: DC
  Filled 2024-03-17: qty 2, 28d supply, fill #0

## 2024-03-17 NOTE — Telephone Encounter (Signed)
 Requested Prescriptions   Pending Prescriptions Disp Refills   Dulaglutide  (TRULICITY ) 1.5 MG/0.5ML SOAJ 2 mL 3    Sig: Inject 1.5 mg into the skin once a week.

## 2024-03-24 ENCOUNTER — Other Ambulatory Visit (HOSPITAL_COMMUNITY): Payer: Self-pay

## 2024-03-25 DIAGNOSIS — S46012D Strain of muscle(s) and tendon(s) of the rotator cuff of left shoulder, subsequent encounter: Secondary | ICD-10-CM | POA: Diagnosis not present

## 2024-03-25 DIAGNOSIS — M6281 Muscle weakness (generalized): Secondary | ICD-10-CM | POA: Diagnosis not present

## 2024-03-25 DIAGNOSIS — M25612 Stiffness of left shoulder, not elsewhere classified: Secondary | ICD-10-CM | POA: Diagnosis not present

## 2024-03-25 DIAGNOSIS — M19012 Primary osteoarthritis, left shoulder: Secondary | ICD-10-CM | POA: Diagnosis not present

## 2024-03-26 ENCOUNTER — Ambulatory Visit: Admitting: Family Medicine

## 2024-03-27 ENCOUNTER — Other Ambulatory Visit

## 2024-03-27 DIAGNOSIS — E1165 Type 2 diabetes mellitus with hyperglycemia: Secondary | ICD-10-CM | POA: Diagnosis not present

## 2024-03-31 DIAGNOSIS — S46012D Strain of muscle(s) and tendon(s) of the rotator cuff of left shoulder, subsequent encounter: Secondary | ICD-10-CM | POA: Diagnosis not present

## 2024-03-31 DIAGNOSIS — M6281 Muscle weakness (generalized): Secondary | ICD-10-CM | POA: Diagnosis not present

## 2024-03-31 DIAGNOSIS — M25612 Stiffness of left shoulder, not elsewhere classified: Secondary | ICD-10-CM | POA: Diagnosis not present

## 2024-03-31 DIAGNOSIS — M19012 Primary osteoarthritis, left shoulder: Secondary | ICD-10-CM | POA: Diagnosis not present

## 2024-04-01 ENCOUNTER — Ambulatory Visit (INDEPENDENT_AMBULATORY_CARE_PROVIDER_SITE_OTHER): Admitting: "Endocrinology

## 2024-04-01 ENCOUNTER — Encounter: Payer: Self-pay | Admitting: "Endocrinology

## 2024-04-01 ENCOUNTER — Other Ambulatory Visit: Payer: Self-pay

## 2024-04-01 VITALS — BP 120/82 | HR 103 | Ht 67.0 in | Wt 161.0 lb

## 2024-04-01 DIAGNOSIS — Z794 Long term (current) use of insulin: Secondary | ICD-10-CM

## 2024-04-01 DIAGNOSIS — Z7984 Long term (current) use of oral hypoglycemic drugs: Secondary | ICD-10-CM | POA: Diagnosis not present

## 2024-04-01 DIAGNOSIS — Z7985 Long-term (current) use of injectable non-insulin antidiabetic drugs: Secondary | ICD-10-CM

## 2024-04-01 DIAGNOSIS — E7800 Pure hypercholesterolemia, unspecified: Secondary | ICD-10-CM

## 2024-04-01 DIAGNOSIS — E1165 Type 2 diabetes mellitus with hyperglycemia: Secondary | ICD-10-CM

## 2024-04-01 LAB — POCT GLYCOSYLATED HEMOGLOBIN (HGB A1C): Hemoglobin A1C: 6.5 % — AB (ref 4.0–5.6)

## 2024-04-01 MED ORDER — TRULICITY 3 MG/0.5ML ~~LOC~~ SOAJ
3.0000 mg | SUBCUTANEOUS | 0 refills | Status: DC
  Filled 2024-04-01: qty 6, 84d supply, fill #0

## 2024-04-01 NOTE — Patient Instructions (Addendum)
 Will recommend the following: Humulin  70/30 insulin : 22 units before breakfast and 18 units before supper 15 min before meals  Synjardy  12.5/1000mg  bid (stop jardiance  if still taking) Trulicity  3 mg/week Losartan  100 mg every day Rosuvastatin  40 mg every day

## 2024-04-01 NOTE — Progress Notes (Addendum)
 Outpatient Endocrinology Note Tyler Newcomer, MD  04/01/24   Tyler Wolf March 24, 1956 [id number]  Referring Provider: Laneta Pintos, MD Primary Care Provider: Laneta Pintos, MD Reason for consultation: Subjective   Assessment & Plan  Diagnoses and all orders for this visit:  Uncontrolled type 2 diabetes mellitus with hyperglycemia (HCC) -     POCT glycosylated hemoglobin (Hb A1C)  Long term (current) use of oral hypoglycemic drugs  Long-term (current) use of injectable non-insulin  antidiabetic drugs  Long-term insulin  use (HCC)  Pure hypercholesterolemia  Other orders -     Dulaglutide  (TRULICITY ) 3 MG/0.5ML SOAJ; Inject 3 mg into the skin once a week.   Diabetes Type II complicated by hyperglycemia Lab Results  Component Value Date   GFR 91.90 08/16/2023   Hba1c goal less than 7, current Hba1c is  Lab Results  Component Value Date   HGBA1C 6.5 (A) 04/01/2024   Will recommend the following: Humulin  70/30 insulin : 22 units before breakfast and 18 units before supper 15 min before meals  Synjardy  12.5/1000mg  bid (stop jardiance  if still taking) Trulicity  3 mg/week Add a walk after dinner  Has DexCom   No known contraindications to any of above medications No history of MEN syndrome/medullary thyroid  cancer/pancreatitis or pancreatic cancer in self or family  -Last LD and Tg are as follows: Lab Results  Component Value Date   LDLCALC 44 03/27/2024    Lab Results  Component Value Date   TRIG 106 03/27/2024   -rosuvastatin  40 mg every day since 08/2023 -Follow low fat diet and exercise   -Blood pressure goal <140/90 - Microalbumin/creatinine goal is < 30 -Last MA/Cr is as follows: Lab Results  Component Value Date   MICROALBUR 7.0 03/27/2024   - on ACE/ARB losartan  100 mg qd -diet changes including salt restriction -limit eating outside -counseled BP targets per standards of diabetes care -uncontrolled blood pressure can lead to  retinopathy, nephropathy and cardiovascular and atherosclerotic heart disease  Reviewed and counseled on: -A1C target -Blood sugar targets -Complications of uncontrolled diabetes  -Checking blood sugar before meals and bedtime and bring log next visit -All medications with mechanism of action and side effects -Hypoglycemia management: rule of 15's, Glucagon Emergency Kit and medical alert ID -low-carb low-fat plate-method diet -At least 20 minutes of physical activity per day -Annual dilated retinal eye exam and foot exam -compliance and follow up needs -follow up as scheduled or earlier if problem gets worse  Call if blood sugar is less than 70 or consistently above 250    Take a 15 gm snack of carbohydrate at bedtime before you go to sleep if your blood sugar is less than 100.    If you are going to fast after midnight for a test or procedure, ask your physician for instructions on how to reduce/decrease your insulin  dose.    Call if blood sugar is less than 70 or consistently above 250  -Treating a low sugar by rule of 15  (15 gms of sugar every 15 min until sugar is more than 70) If you feel your sugar is low, test your sugar to be sure If your sugar is low (less than 70), then take 15 grams of a fast acting Carbohydrate (3-4 glucose tablets or glucose gel or 4 ounces of juice or regular soda) Recheck your sugar 15 min after treating low to make sure it is more than 70 If sugar is still less than 70, treat again with 15 grams of  carbohydrate          Don't drive the hour of hypoglycemia  If unconscious/unable to eat or drink by mouth, use glucagon injection or nasal spray baqsimi and call 911. Can repeat again in 15 min if still unconscious.  Return in about 3 months (around 07/02/2024).   I have reviewed current medications, nurse's notes, allergies, vital signs, past medical and surgical history, family medical history, and social history for this encounter. Counseled patient  on symptoms, examination findings, lab findings, imaging results, treatment decisions and monitoring and prognosis. The patient understood the recommendations and agrees with the treatment plan. All questions regarding treatment plan were fully answered.  Tyler Newcomer, MD  04/01/24    History of Present Illness Tyler Wolf is a 68 y.o. year old male who presents for evaluation of Type II diabetes mellitus.  Tyler Wolf was first diagnosed in 2009. Diabetes education +  Home diabetes regimen: Humulin  70/30 insulin : 20 units before breakfast and 20 units before supper Synjardy  12.5/1000mg  twice a day Trulicity  1.5 mg/week  BLOOD SUGAR DATA CGM interpretation: At today's visit, we reviewed her CGM downloads. The full report is scanned in the media. Reviewing the CGM trends, BG are elevated after meals and low overnight, a lot of variability.   Physical Exam  BP 120/82   Pulse (!) 103   Ht 5' 7 (1.702 m)   Wt 161 lb (73 kg)   SpO2 98%   BMI 25.22 kg/m    Constitutional: well developed, well nourished Head: normocephalic, atraumatic Eyes: sclera anicteric, no redness Neck: supple Lungs: normal respiratory effort Neurology: alert and oriented Skin: dry, no appreciable rashes Musculoskeletal: no appreciable defects Psychiatric: normal mood and affect Diabetic Foot Exam - Simple   No data filed      Current Medications Patient's Medications  New Prescriptions   DULAGLUTIDE  (TRULICITY ) 3 MG/0.5ML SOAJ    Inject 3 mg into the skin once a week.  Previous Medications   ACETAMINOPHEN  EXTRA STRENGTH 500 MG CAPS    Take 2 capsules by mouth every 8 (eight) hours.   ASPIRIN 81 MG TABLET    Take 81 mg by mouth daily.   CELECOXIB (CELEBREX) 100 MG CAPSULE    Take 100 mg by mouth 2 (two) times daily.   CHOLECALCIFEROL (VITAMIN D3) 25 MCG (1000 UNIT) TABLET    Take 2,000 Units by mouth daily.   CONTINUOUS GLUCOSE SENSOR (FREESTYLE LIBRE 3 PLUS SENSOR) MISC    Inject 1 Device  into the skin continuous. Change every 15 days   CYCLOBENZAPRINE  (FLEXERIL ) 10 MG TABLET    Take 1 tablet (10 mg total) by mouth 2 (two) times daily as needed for muscle spasms.   EMPAGLIFLOZIN -METFORMIN  HCL ER (SYNJARDY  XR) 12.02-999 MG TB24    Take 1 tablet by mouth 2 (two) times daily.   GLUCOSE BLOOD (CONTOUR NEXT TEST) TEST STRIP    1 each by Other route 2 (two) times daily. And lancets 2/day   INSULIN  ISOPHANE & REGULAR HUMAN KWIKPEN (HUMULIN  70/30 KWIKPEN) (70-30) 100 UNIT/ML KWIKPEN    Inject 45 Units into the skin daily before breakfast AND 25 Units daily before supper.   INSULIN  PEN NEEDLE 31G X 5 MM MISC    Used to inject insulin    INSULIN  SYRINGE-NEEDLE U-100 (INSULIN  SYRINGE 1CC/30GX5/16) 30G X 5/16 1 ML MISC    1 Syringe by Does not apply route daily. Use to inject insulin  daily   LOSARTAN -HYDROCHLOROTHIAZIDE  (HYZAAR ) 100-12.5 MG TABLET  Take 1 tablet by mouth daily.   ONDANSETRON (ZOFRAN-ODT) 4 MG DISINTEGRATING TABLET    Take 4 mg by mouth every 8 (eight) hours as needed.   OXYCODONE  (OXY IR/ROXICODONE ) 5 MG IMMEDIATE RELEASE TABLET    Take 5-10 mg by mouth every 6 (six) hours as needed.   ROSUVASTATIN  (CRESTOR ) 40 MG TABLET    Take 1 tablet (40 mg total) by mouth daily.  Modified Medications   No medications on file  Discontinued Medications   DULAGLUTIDE  (TRULICITY ) 1.5 MG/0.5ML SOAJ    Inject 1.5 mg into the skin once a week.   EMPAGLIFLOZIN  (JARDIANCE ) 10 MG TABS TABLET    Take 1 tablet (10 mg total) by mouth daily before breakfast.    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  Social History Social History   Socioeconomic History   Marital status: Married    Spouse name: Not on file   Number of children: Not on file   Years of education: Not on file   Highest education level: Not on file  Occupational History   Not on file  Tobacco Use   Smoking status: Every Day    Current packs/day: 0.55    Average packs/day: 0.6 packs/day for 51.0 years (28.1 ttl pk-yrs)    Types: Cigarettes    Passive exposure: Never   Smokeless tobacco: Never   Tobacco comments:    electronic cigarettes, 6/1/2020pt states on and off  Vaping Use   Vaping status: Never Used  Substance and Sexual Activity   Alcohol use: No    Alcohol/week: 0.0 standard drinks of alcohol   Drug use: No   Sexual activity: Not Currently    Birth control/protection: None  Other Topics Concern   Not on file  Social History Narrative   Not on file   Social Drivers of Health   Financial Resource Strain: Low Risk  (04/03/2023)   Overall Financial Resource Strain (CARDIA)    Difficulty of Paying Living Expenses: Not hard at all  Food Insecurity: No Food Insecurity (04/03/2023)   Hunger Vital Sign    Worried About Running Out of Food in the Last Year: Never true    Ran Out of Food in the Last Year: Never true  Transportation Needs: No Transportation Needs (04/03/2023)   PRAPARE - Administrator, Civil Service (Medical): No    Lack of Transportation (Non-Medical): No  Physical Activity: Sufficiently Active (04/03/2023)   Exercise Vital Sign    Days of Exercise per Week: 2 days    Minutes of Exercise per Session: 110 min  Stress: No Stress Concern Present (04/03/2023)   [employer] of Occupational Health - Occupational Stress Questionnaire    Feeling of Stress : Not at all  Social Connections: Moderately Isolated (04/03/2023)   Social Connection and Isolation  Panel    Frequency of Communication with Friends and Family: Once a week    Frequency of Social Gatherings with Friends and  Family: Twice a week    Attends Religious Services: Never    Active Member of Clubs or Organizations: No    Attends Banker Meetings: Never    Marital Status: Married  Catering manager Violence: Not At Risk (04/03/2023)   Humiliation, Afraid, Rape, and Kick questionnaire    Fear of Current or Ex-Partner: No    Emotionally Abused: No    Physically Abused: No    Sexually Abused: No    Lab Results  Component Value Date   HGBA1C 6.5 (A) 04/01/2024   HGBA1C 7.0 (A) 01/07/2024   HGBA1C 8.3 (A) 09/10/2023   Lab Results  Component Value Date   CHOL 98 03/27/2024   Lab Results  Component Value Date   HDL 35 (L) 03/27/2024   Lab Results  Component Value Date   LDLCALC 44 03/27/2024   Lab Results  Component Value Date   TRIG 106 03/27/2024   Lab Results  Component Value Date   CHOLHDL 2.8 03/27/2024   Lab Results  Component Value Date   CREATININE 0.78 01/23/2024   Lab Results  Component Value Date   GFR 91.90 08/16/2023   Lab Results  Component Value Date   MICROALBUR 7.0 03/27/2024      Component Value Date/Time   NA 143 01/23/2024 1006   K 4.9 01/23/2024 1006   CL 103 01/23/2024 1006   CO2 24 01/23/2024 1006   GLUCOSE 89 01/23/2024 1006   GLUCOSE 172 (H) 08/16/2023 1218   GLUCOSE 94 09/10/2006 1325   BUN 21 01/23/2024 1006   CREATININE 0.78 01/23/2024 1006   CALCIUM  9.7 01/23/2024 1006   PROT 7.2 08/16/2023 1218   PROT 7.4 02/26/2023 0935   ALBUMIN 4.5 08/16/2023 1218   ALBUMIN 4.6 02/26/2023 0935   AST 20 08/16/2023 1218   ALT 28 08/16/2023 1218   ALKPHOS 60 08/16/2023 1218   BILITOT 0.5 08/16/2023 1218   BILITOT 0.5 02/26/2023 0935   GFRNONAA 93 02/12/2020 0939   GFRAA 107 02/12/2020 0939      Latest Ref Rng & Units 01/23/2024   10:06 AM 08/16/2023   12:18 PM 02/26/2023    9:35 AM  BMP  Glucose 70 - 99 mg/dL  89  098  119   BUN 8 - 27 mg/dL 21  17  15    Creatinine 0.76 - 1.27 mg/dL 1.47  8.29  5.62   BUN/Creat Ratio 10 - 24 27   21    Sodium 134 - 144 mmol/L 143  141  142   Potassium 3.5 - 5.2 mmol/L 4.9  4.3  4.6   Chloride 96 - 106 mmol/L 103  104  101   CO2 20 - 29 mmol/L 24  28  23    Calcium  8.6 - 10.2 mg/dL 9.7  9.5  9.8        Component Value Date/Time   WBC 9.2 02/26/2023 0935   WBC 6.8 07/01/2018 0906   RBC 5.32 02/26/2023 0935   RBC 4.99 07/01/2018 0906   HGB 16.4 02/26/2023 0935   HCT 47.4 02/26/2023 0935   PLT 256 02/26/2023 0935   MCV 89 02/26/2023 0935   MCH 30.8 02/26/2023 0935   MCHC 34.6 02/26/2023 0935   MCHC 34.7 07/01/2018 0906   RDW 12.9 02/26/2023 0935   LYMPHSABS 1.4 02/26/2023 0935   MONOABS 0.6 07/01/2018 0906   EOSABS 0.2 02/26/2023 0935   BASOSABS 0.0 02/26/2023 0935     Parts of this note may have been dictated using  voice recognition software. There may be variances in spelling and vocabulary which are unintentional. Not all errors are proofread. Please notify the [facility] if any discrepancies are noted or if the meaning of any statement is not clear.

## 2024-04-02 ENCOUNTER — Ambulatory Visit: Payer: Self-pay

## 2024-04-02 NOTE — Telephone Encounter (Signed)
 The patient's wife shares that they have pulled off three ticks recently. One was removed 9 days ago from their underarm and another from their leg recently. The patient has not shown any symptoms or fever but their wife is concerned with the multiple bites. Please contact Ms Adriana Hopping at [phone] [facility]) further if/when possible    Reason for Disposition  [1] Red or very tender (to touch) area AND [2] started over 24 hours after the bite  Answer Assessment - Initial Assessment Questions 1. ATTACHED:  Is the tick still on the skin?  (e.g., yes, no, unsure)     No 2. ONSET - TICK STILL ATTACHED:  How long do you think the tick has been on your skin? (e.g., hours, days, unsure)  Note:  Is there a recent activity (camping, hiking) where the caller may have been exposed?     Unsure  3. ONSET - TICK NOT STILL ATTACHED: If the tick has been removed, how long do you think the tick was attached before you removed it? (e.g., 5 hours, 2 days). When was this?     9 days ago and 3 days ago  4. LOCATION: Where is the tick bite located? (e.g., arm, leg)     Right under arm and left leg in calf area  5. TYPE of TICK: Is it a wood tick or a deer tick? (e.g., deer tick, wood tick; unsure)     Unsure  6. SIZE of TICK: How big is the tick? (e.g., size of poppy seed, apple seed, watermelon seed; unsure) Note: Deer ticks can be the size of a poppy seed (nymph) or an apple seed (adult).       Smaller than the head of a pin  7. ENGORGED: Did the tick look flat or engorged (full, swollen)? (e.g., flat, engorged; unsure)     Flat  8. OTHER SYMPTOMS: Do you have any other symptoms? (e.g., fever, rash, redness at bite area, red ring around bite)     Redness to the bite areas, dry flaky skin to face around the nose  Protocols used: Tick Bite-A-AH   FYI Only or Action Required?: FYI only for provider.  Patient was last seen in primary care on 01/23/2024 by Laneta Pintos, MD. Called Nurse Triage  reporting Tick Removal. Symptoms began several days ago.  Symptoms are: gradually improving.  Triage Disposition: See Physician Within 24 Hours  Patient/caregiver understands and will follow disposition?: Yes

## 2024-04-03 ENCOUNTER — Ambulatory Visit (INDEPENDENT_AMBULATORY_CARE_PROVIDER_SITE_OTHER): Admitting: Family Medicine

## 2024-04-03 ENCOUNTER — Other Ambulatory Visit: Payer: Self-pay

## 2024-04-03 ENCOUNTER — Encounter: Payer: Self-pay | Admitting: Family Medicine

## 2024-04-03 ENCOUNTER — Other Ambulatory Visit (HOSPITAL_COMMUNITY): Payer: Self-pay

## 2024-04-03 VITALS — BP 121/76 | HR 81 | Ht 67.0 in | Wt 160.0 lb

## 2024-04-03 DIAGNOSIS — L219 Seborrheic dermatitis, unspecified: Secondary | ICD-10-CM

## 2024-04-03 DIAGNOSIS — W57XXXA Bitten or stung by nonvenomous insect and other nonvenomous arthropods, initial encounter: Secondary | ICD-10-CM | POA: Diagnosis not present

## 2024-04-03 MED ORDER — KETOCONAZOLE 2 % EX SHAM
MEDICATED_SHAMPOO | CUTANEOUS | 1 refills | Status: AC
Start: 2024-04-03 — End: ?
  Filled 2024-04-03: qty 120, 30d supply, fill #0

## 2024-04-03 MED ORDER — FLUTICASONE PROPIONATE 50 MCG/ACT NA SUSP
2.0000 | Freq: Every day | NASAL | 6 refills | Status: AC
Start: 2024-04-03 — End: ?
  Filled 2024-04-03: qty 16, 30d supply, fill #0

## 2024-04-03 NOTE — Progress Notes (Unsigned)
   Acute Office Visit  Subjective:     Patient ID: Tyler Wolf, male    DOB: Aug 05, 1956, 68 y.o.   MRN: [id number]  Chief Complaint  Patient presents with  . Insect Bite    HPI Patient is in today for   Subjective - Tick bites: First bite in March on back side, second on hip, third on unspecified location, fourth most recent (approximately 2 days ago) - No rash development at bite sites - Bites occurred while walking dog in grass - Facial skin concerns: white flaky skin around chin and lip, different skin texture, spreading toward eyes - Nasal symptoms: nose runs when eating - No previous history of allergies until this year  Medications None mentioned.  PMH, PSH, FH, Social Hx Elevated blood sugar, recently 6.5 (first time under 7 in a long time), followed by endocrinologist.  ROS Skin: white flaky skin around chin and lip, different skin texture ENT: nasal discharge with eating  ROS      Objective:    BP 121/76   Pulse 81   Ht 5' 7 (1.702 m)   Wt 160 lb (72.6 kg)   SpO2 99%   BMI 25.06 kg/m  {Vitals History (Optional):23777}  Physical Exam Gen: alert, oriented Heent: flaky, dry skin in the perioral region.  Skin: no concerning rashes seen at the area of previous tick bites. (Right axilla, right groin)  No results found for any visits on 04/03/24.      Assessment & Plan:   There are no diagnoses linked to this encounter.   No follow-ups on file.  Laneta Pintos, MD

## 2024-04-03 NOTE — Patient Instructions (Addendum)
 It was nice to see you today,  We addressed the following topics today: -I have prescribed a antifungal shampoo for you to use on your hair and face once a day in the shower for your rash.  After the rash has resolved you can continue using it twice a week. - I do not see anything concerning on your tick bites.  If the area around your tick bite appears to become more red painful or swollen then let us  know.  Otherwise I will follow-up with you at your next visit next month. - For your runny nose that occurs when you eat, I would recommend using intranasal corticosteroids like Flonase that is available over-the-counter once a day in each nostril until you see me again in 1 month.  At that time we can see if it has improved your symptoms.  Have a great day,  Etha Henle, MD

## 2024-04-07 DIAGNOSIS — M25612 Stiffness of left shoulder, not elsewhere classified: Secondary | ICD-10-CM | POA: Diagnosis not present

## 2024-04-07 DIAGNOSIS — M6281 Muscle weakness (generalized): Secondary | ICD-10-CM | POA: Diagnosis not present

## 2024-04-07 DIAGNOSIS — S46012D Strain of muscle(s) and tendon(s) of the rotator cuff of left shoulder, subsequent encounter: Secondary | ICD-10-CM | POA: Diagnosis not present

## 2024-04-07 DIAGNOSIS — M19012 Primary osteoarthritis, left shoulder: Secondary | ICD-10-CM | POA: Diagnosis not present

## 2024-04-09 DIAGNOSIS — W57XXXA Bitten or stung by nonvenomous insect and other nonvenomous arthropods, initial encounter: Secondary | ICD-10-CM | POA: Insufficient documentation

## 2024-04-09 DIAGNOSIS — L219 Seborrheic dermatitis, unspecified: Secondary | ICD-10-CM | POA: Insufficient documentation

## 2024-04-09 NOTE — Assessment & Plan Note (Signed)
-   Antifungal shampoo prescribed to [facility] - Apply to affected areas daily for several weeks, then reduce to twice weekly for maintenance - Avoid eye contact with product

## 2024-04-09 NOTE — Assessment & Plan Note (Signed)
-   Multiple tick bites, no signs of infection or Lyme disease - No antibiotics needed - Continue to monitor for spreading rash or worsening symptoms

## 2024-04-10 ENCOUNTER — Other Ambulatory Visit: Payer: Self-pay

## 2024-04-11 ENCOUNTER — Other Ambulatory Visit: Payer: Self-pay

## 2024-04-17 DIAGNOSIS — S46012D Strain of muscle(s) and tendon(s) of the rotator cuff of left shoulder, subsequent encounter: Secondary | ICD-10-CM | POA: Diagnosis not present

## 2024-04-17 DIAGNOSIS — M6281 Muscle weakness (generalized): Secondary | ICD-10-CM | POA: Diagnosis not present

## 2024-04-17 DIAGNOSIS — M25612 Stiffness of left shoulder, not elsewhere classified: Secondary | ICD-10-CM | POA: Diagnosis not present

## 2024-04-17 DIAGNOSIS — M19012 Primary osteoarthritis, left shoulder: Secondary | ICD-10-CM | POA: Diagnosis not present

## 2024-04-21 DIAGNOSIS — M25612 Stiffness of left shoulder, not elsewhere classified: Secondary | ICD-10-CM | POA: Diagnosis not present

## 2024-04-21 DIAGNOSIS — M6281 Muscle weakness (generalized): Secondary | ICD-10-CM | POA: Diagnosis not present

## 2024-04-21 DIAGNOSIS — S46012D Strain of muscle(s) and tendon(s) of the rotator cuff of left shoulder, subsequent encounter: Secondary | ICD-10-CM | POA: Diagnosis not present

## 2024-04-21 DIAGNOSIS — M19012 Primary osteoarthritis, left shoulder: Secondary | ICD-10-CM | POA: Diagnosis not present

## 2024-04-23 ENCOUNTER — Ambulatory Visit (INDEPENDENT_AMBULATORY_CARE_PROVIDER_SITE_OTHER): Admitting: Family Medicine

## 2024-04-23 ENCOUNTER — Encounter: Payer: Self-pay | Admitting: Family Medicine

## 2024-04-23 VITALS — BP 112/79 | HR 86 | Ht 67.0 in | Wt 157.4 lb

## 2024-04-23 DIAGNOSIS — E1169 Type 2 diabetes mellitus with other specified complication: Secondary | ICD-10-CM | POA: Diagnosis not present

## 2024-04-23 DIAGNOSIS — Z794 Long term (current) use of insulin: Secondary | ICD-10-CM | POA: Diagnosis not present

## 2024-04-23 DIAGNOSIS — Z720 Tobacco use: Secondary | ICD-10-CM | POA: Diagnosis not present

## 2024-04-23 DIAGNOSIS — W57XXXA Bitten or stung by nonvenomous insect and other nonvenomous arthropods, initial encounter: Secondary | ICD-10-CM | POA: Diagnosis not present

## 2024-04-23 DIAGNOSIS — E782 Mixed hyperlipidemia: Secondary | ICD-10-CM

## 2024-04-23 DIAGNOSIS — I10 Essential (primary) hypertension: Secondary | ICD-10-CM | POA: Diagnosis not present

## 2024-04-23 DIAGNOSIS — J300 Vasomotor rhinitis: Secondary | ICD-10-CM | POA: Diagnosis not present

## 2024-04-23 DIAGNOSIS — Z125 Encounter for screening for malignant neoplasm of prostate: Secondary | ICD-10-CM | POA: Diagnosis not present

## 2024-04-23 NOTE — Assessment & Plan Note (Signed)
 Agreeable to CT scan screening.  Sending in order.  Advised to let us  know if he has not heard from anybody in the next 3 weeks.

## 2024-04-23 NOTE — Assessment & Plan Note (Signed)
 Taking Flonase  as needed.  Recommend daily use.  Symptoms improved but still present.  Symptoms include rhinitis associated with eating.

## 2024-04-23 NOTE — Progress Notes (Signed)
   Established Patient Office Visit  Subjective   Patient ID: Tyler Wolf, male    DOB: December 04, 1955  Age: 68 y.o. MRN: [id number]  Chief Complaint  Patient presents with   Medical Management of Chronic Issues    HPI Vasomotor rhinitis-patient says this is somewhat better.  he takes his Flonase  as needed instead of daily.  We discussed using it daily for better efficacy.  Patient is a smoker, trying to quit and has reduced his overall cigarette consumption but is eligible for lung cancer screening.  Has talked about this before with previous PCP.  He is agreeable to getting CT scan.    The ASCVD Risk score (Arnett DK, et al., 2019) failed to calculate for the following reasons:   The valid total cholesterol range is 130 to 320 mg/dL  Health Maintenance Due  Topic Date Due   Hepatitis C Screening  Never done   Lung Cancer Screening  Never done   COVID-19 Vaccine (3 - 2024-25 season) 06/17/2023   Medicare Annual Wellness (AWV)  04/02/2024      Objective:     BP 112/79   Pulse 86   Ht 5' 7 (1.702 m)   Wt 157 lb 6.4 oz (71.4 kg)   SpO2 98%   BMI 24.65 kg/m    Physical Exam General: Alert, oriented Pulmonary: No respiratory stress Psych: Pleasant affect Extremities: Normal monofilament testing.  Thickened discolored toenail on the left great toe.  DP pulses equal bilaterally.   No results found for any visits on 04/23/24.      Assessment & Plan:   Vasomotor rhinitis Assessment & Plan: Taking Flonase  as needed.  Recommend daily use.  Symptoms improved but still present.  Symptoms include rhinitis associated with eating.   Tobacco use Assessment & Plan: Agreeable to CT scan screening.  Sending in order.  Advised to let us  know if he has not heard from anybody in the next 3 weeks.  Orders: -     CT CHEST LUNG CANCER SCREENING LOW DOSE WO CONTRAST; Future  Essential hypertension -     Comprehensive metabolic panel with GFR; Future  Type 2 diabetes  mellitus with other specified complication, with long-term current use of insulin  (HCC) -     Comprehensive metabolic panel with GFR; Future  Mixed diabetic hyperlipidemia associated with type 2 diabetes mellitus (HCC) -     Lipid panel; Future  Prostate cancer screening -     PSA; Future  Tick bite, unspecified site, initial encounter Assessment & Plan: Previous tick bites have resolved.      Return in about 6 months (around 10/24/2024) for DM, HTN, hld.    Toribio MARLA Slain, MD

## 2024-04-23 NOTE — Patient Instructions (Addendum)
 It was nice to see you today,  We addressed the following topics today: -I would recommend taking your Flonase  every day to help with the runny nose.  It would work better than taking it as needed. - Because you are a smoker it is a good idea to get yearly lung cancer screening test.  I am sending in an order to repeat your imaging on [facility].  Someone will call you in the next couple weeks to schedule this.  You will get these once a year until you are 60 - Because you are doing so much better with your diabetic control I do not need to see you for another 6 months.  Have a great day,  Rolan Slain, MD

## 2024-04-23 NOTE — Assessment & Plan Note (Signed)
 Previous tick bites have resolved.

## 2024-04-28 DIAGNOSIS — M25612 Stiffness of left shoulder, not elsewhere classified: Secondary | ICD-10-CM | POA: Diagnosis not present

## 2024-04-28 DIAGNOSIS — S46012D Strain of muscle(s) and tendon(s) of the rotator cuff of left shoulder, subsequent encounter: Secondary | ICD-10-CM | POA: Diagnosis not present

## 2024-04-28 DIAGNOSIS — M19012 Primary osteoarthritis, left shoulder: Secondary | ICD-10-CM | POA: Diagnosis not present

## 2024-04-28 DIAGNOSIS — M6281 Muscle weakness (generalized): Secondary | ICD-10-CM | POA: Diagnosis not present

## 2024-05-01 ENCOUNTER — Other Ambulatory Visit: Payer: Self-pay

## 2024-05-02 ENCOUNTER — Ambulatory Visit
Admission: RE | Admit: 2024-05-02 | Discharge: 2024-05-02 | Disposition: A | Discharge status: Home / Self Care | Source: Ambulatory Visit | Attending: Family Medicine | Admitting: Family Medicine

## 2024-05-02 DIAGNOSIS — Z720 Tobacco use: Secondary | ICD-10-CM

## 2024-05-02 DIAGNOSIS — M19012 Primary osteoarthritis, left shoulder: Secondary | ICD-10-CM | POA: Diagnosis not present

## 2024-05-02 DIAGNOSIS — F17210 Nicotine dependence, cigarettes, uncomplicated: Secondary | ICD-10-CM | POA: Diagnosis not present

## 2024-05-02 DIAGNOSIS — Z122 Encounter for screening for malignant neoplasm of respiratory organs: Secondary | ICD-10-CM | POA: Diagnosis not present

## 2024-05-05 ENCOUNTER — Other Ambulatory Visit: Payer: Self-pay

## 2024-05-05 ENCOUNTER — Other Ambulatory Visit (HOSPITAL_COMMUNITY): Payer: Self-pay

## 2024-05-07 DIAGNOSIS — S46012D Strain of muscle(s) and tendon(s) of the rotator cuff of left shoulder, subsequent encounter: Secondary | ICD-10-CM | POA: Diagnosis not present

## 2024-05-07 DIAGNOSIS — M25612 Stiffness of left shoulder, not elsewhere classified: Secondary | ICD-10-CM | POA: Diagnosis not present

## 2024-05-07 DIAGNOSIS — M19012 Primary osteoarthritis, left shoulder: Secondary | ICD-10-CM | POA: Diagnosis not present

## 2024-05-07 DIAGNOSIS — M6281 Muscle weakness (generalized): Secondary | ICD-10-CM | POA: Diagnosis not present

## 2024-05-08 ENCOUNTER — Other Ambulatory Visit: Payer: Self-pay

## 2024-05-09 ENCOUNTER — Other Ambulatory Visit: Payer: Self-pay

## 2024-05-12 ENCOUNTER — Ambulatory Visit: Payer: Self-pay

## 2024-06-02 ENCOUNTER — Other Ambulatory Visit: Payer: Self-pay

## 2024-06-10 ENCOUNTER — Other Ambulatory Visit (HOSPITAL_COMMUNITY): Payer: Self-pay

## 2024-06-10 ENCOUNTER — Other Ambulatory Visit: Payer: Self-pay

## 2024-06-10 ENCOUNTER — Other Ambulatory Visit: Payer: Self-pay | Admitting: "Endocrinology

## 2024-06-10 MED ORDER — TRULICITY 3 MG/0.5ML ~~LOC~~ SOAJ
3.0000 mg | SUBCUTANEOUS | 0 refills | Status: DC
  Filled 2024-06-10: qty 6, 84d supply, fill #0

## 2024-06-10 NOTE — Telephone Encounter (Signed)
 Requested Prescriptions     Pending Prescriptions Disp Refills    Dulaglutide (TRULICITY) 3 MG/0.5ML SOAJ 6 mL 0     Sig: Inject 3 mg into the skin once a week

## 2024-06-11 ENCOUNTER — Other Ambulatory Visit: Payer: Self-pay

## 2024-06-24 ENCOUNTER — Other Ambulatory Visit (HOSPITAL_COMMUNITY): Payer: Self-pay

## 2024-06-24 ENCOUNTER — Ambulatory Visit (INDEPENDENT_AMBULATORY_CARE_PROVIDER_SITE_OTHER): Admitting: "Endocrinology

## 2024-06-24 ENCOUNTER — Encounter: Payer: Self-pay | Admitting: "Endocrinology

## 2024-06-24 ENCOUNTER — Other Ambulatory Visit: Payer: Self-pay

## 2024-06-24 DIAGNOSIS — Z794 Long term (current) use of insulin: Secondary | ICD-10-CM | POA: Diagnosis not present

## 2024-06-24 DIAGNOSIS — E1165 Type 2 diabetes mellitus with hyperglycemia: Secondary | ICD-10-CM | POA: Diagnosis not present

## 2024-06-24 MED ORDER — TRULICITY 3 MG/0.5ML ~~LOC~~ SOAJ
3.0000 mg | SUBCUTANEOUS | 3 refills | Status: DC
  Filled 2024-06-24: qty 6, 84d supply, fill #0

## 2024-06-24 MED ORDER — SYNJARDY XR 12.5-1000 MG PO TB24
1.0000 | ORAL_TABLET | Freq: Two times a day (BID) | ORAL | 5 refills | Status: AC
  Filled 2024-06-24 – 2024-07-08 (×2): qty 60, 30d supply, fill #0
  Filled 2024-08-07: qty 60, 30d supply, fill #1
  Filled 2024-09-03: qty 60, 30d supply, fill #2
  Filled 2024-10-13: qty 60, 30d supply, fill #3
  Filled 2024-11-12: qty 60, 30d supply, fill #4

## 2024-06-24 MED ORDER — HUMULIN 70/30 KWIKPEN (70-30) 100 UNIT/ML ~~LOC~~ SUPN
PEN_INJECTOR | SUBCUTANEOUS | 3 refills | Status: DC
  Filled 2024-06-24 – 2024-08-21 (×2): qty 39, 95d supply, fill #0

## 2024-06-24 NOTE — Progress Notes (Signed)
 Outpatient Endocrinology Note Tyler Birmingham, MD  06/24/24   Tyler Wolf Aug 04, 1956 [id number]  Referring Provider: Chandra Toribio POUR, MD Primary Care Provider: Chandra Toribio POUR, MD Reason for consultation: Subjective   Assessment & Plan  Diagnoses and all orders for this visit:  Uncontrolled type 2 diabetes mellitus with hyperglycemia (HCC) -     Empagliflozin -metFORMIN  HCl ER (SYNJARDY  XR) 12.02-999 MG TB24; Take 1 tablet by mouth 2 (two) times daily.  Uncontrolled type 2 diabetes mellitus with hyperglycemia, with long-term current use of insulin  (HCC) -     insulin  isophane & regular human KwikPen (HUMULIN  70/30 KWIKPEN) (70-30) 100 UNIT/ML KwikPen; Inject 25 Units into the skin daily before breakfast AND 16 Units daily before supper.  Other orders -     Dulaglutide  (TRULICITY ) 3 MG/0.5ML SOAJ; Inject 3 mg into the skin once a week.    Diabetes Type II complicated by hyperglycemia Lab Results  Component Value Date   GFR 91.90 08/16/2023   Hba1c goal less than 7, current Hba1c is  Lab Results  Component Value Date   HGBA1C 6.5 (A) 04/01/2024   Will recommend the following: Humulin  70/30 insulin : 25 units before breakfast and 16 units before supper 15 min before meals  Synjardy  12.5/1000mg  bid  Trulicity  3 mg/week Add a walk after dinner  Has DexCom   No known contraindications to any of above medications No history of MEN syndrome/medullary thyroid  cancer/pancreatitis or pancreatic cancer in self or family  -Last LD and Tg are as follows: Lab Results  Component Value Date   LDLCALC 44 03/27/2024    Lab Results  Component Value Date   TRIG 106 03/27/2024   -rosuvastatin  40 mg every day since 08/2023 -Follow low fat diet and exercise   -Blood pressure goal <140/90 - Microalbumin/creatinine goal is < 30 -Last MA/Cr is as follows: Lab Results  Component Value Date   MICROALBUR 7.0 03/27/2024   - on ACE/ARB losartan  100 mg qd -diet changes  including salt restriction -limit eating outside -counseled BP targets per standards of diabetes care -uncontrolled blood pressure can lead to retinopathy, nephropathy and cardiovascular and atherosclerotic heart disease  Reviewed and counseled on: -A1C target -Blood sugar targets -Complications of uncontrolled diabetes  -Checking blood sugar before meals and bedtime and bring log next visit -All medications with mechanism of action and side effects -Hypoglycemia management: rule of 15's, Glucagon Emergency Kit and medical alert ID -low-carb low-fat plate-method diet -At least 20 minutes of physical activity per day -Annual dilated retinal eye exam and foot exam -compliance and follow up needs -follow up as scheduled or earlier if problem gets worse  Call if blood sugar is less than 70 or consistently above 250    Take a 15 gm snack of carbohydrate at bedtime before you go to sleep if your blood sugar is less than 100.    If you are going to fast after midnight for a test or procedure, ask your physician for instructions on how to reduce/decrease your insulin  dose.    Call if blood sugar is less than 70 or consistently above 250  -Treating a low sugar by rule of 15  (15 gms of sugar every 15 min until sugar is more than 70) If you feel your sugar is low, test your sugar to be sure If your sugar is low (less than 70), then take 15 grams of a fast acting Carbohydrate (3-4 glucose tablets or glucose gel or 4 ounces of juice or  regular soda) Recheck your sugar 15 min after treating low to make sure it is more than 70 If sugar is still less than 70, treat again with 15 grams of carbohydrate          Don't drive the hour of hypoglycemia  If unconscious/unable to eat or drink by mouth, use glucagon injection or nasal spray baqsimi and call 911. Can repeat again in 15 min if still unconscious.  Return in about 2 months (around 08/25/2024).   I have reviewed current medications, nurse's  notes, allergies, vital signs, past medical and surgical history, family medical history, and social history for this encounter. Counseled patient on symptoms, examination findings, lab findings, imaging results, treatment decisions and monitoring and prognosis. The patient understood the recommendations and agrees with the treatment plan. All questions regarding treatment plan were fully answered.  Tyler Birmingham, MD  06/24/24    History of Present Illness Tyler Wolf is a 68 y.o. year old male who presents for follow up of Type II diabetes mellitus.  Tyler Wolf was first diagnosed in 2009. Diabetes education +  Home diabetes regimen: Humulin  70/30 insulin : 22 units before breakfast and 18 units before supper Synjardy  12.5/1000mg  twice a day Trulicity  3 mg/week  BLOOD SUGAR DATA CGM interpretation: At today's visit, we reviewed her CGM downloads. The full report is scanned in the media. Reviewing the CGM trends, BG are elevated after meals and low overnight.  Physical Exam  BP 110/80   Pulse 84   Ht 5' 7 (1.702 m)   Wt 161 lb (73 kg)   SpO2 96%   BMI 25.22 kg/m    Constitutional: well developed, well nourished Head: normocephalic, atraumatic Eyes: sclera anicteric, no redness Neck: supple Lungs: normal respiratory effort Neurology: alert and oriented Skin: dry, no appreciable rashes Musculoskeletal: no appreciable defects Psychiatric: normal mood and affect Diabetic Foot Exam - Simple   No data filed      Current Medications Patient's Medications  New Prescriptions   No medications on file  Previous Medications   ACETAMINOPHEN  EXTRA STRENGTH 500 MG CAPS    Take 2 capsules by mouth every 8 (eight) hours.   ASPIRIN 81 MG TABLET    Take 81 mg by mouth daily.   CELECOXIB (CELEBREX) 100 MG CAPSULE    Take 100 mg by mouth 2 (two) times daily.   CHOLECALCIFEROL (VITAMIN D3) 25 MCG (1000 UNIT) TABLET    Take 2,000 Units by mouth daily.   CONTINUOUS GLUCOSE SENSOR  (FREESTYLE LIBRE 3 PLUS SENSOR) MISC    Inject 1 Device into the skin continuous. Change every 15 days   CYCLOBENZAPRINE  (FLEXERIL ) 10 MG TABLET    Take 1 tablet (10 mg total) by mouth 2 (two) times daily as needed for muscle spasms.   FLUTICASONE  (FLONASE ) 50 MCG/ACT NASAL SPRAY    Place 2 sprays into both nostrils daily.   GLUCOSE BLOOD (CONTOUR NEXT TEST) TEST STRIP    1 each by Other route 2 (two) times daily. And lancets 2/day   INSULIN  PEN NEEDLE 31G X 5 MM MISC    Used to inject insulin    INSULIN  SYRINGE-NEEDLE U-100 (INSULIN  SYRINGE 1CC/30GX5/16) 30G X 5/16 1 ML MISC    1 Syringe by Does not apply route daily. Use to inject insulin  daily   KETOCONAZOLE  (NIZORAL ) 2 % SHAMPOO    Apply daily to the face and hair in the shower (avoid eyes and mouth).  After rash has resolved you can continue using  it twice a week   LOSARTAN -HYDROCHLOROTHIAZIDE  (HYZAAR ) 100-12.5 MG TABLET    Take 1 tablet by mouth daily.   ONDANSETRON (ZOFRAN-ODT) 4 MG DISINTEGRATING TABLET    Take 4 mg by mouth every 8 (eight) hours as needed.   OXYCODONE  (OXY IR/ROXICODONE ) 5 MG IMMEDIATE RELEASE TABLET    Take 5-10 mg by mouth every 6 (six) hours as needed.   ROSUVASTATIN  (CRESTOR ) 40 MG TABLET    Take 1 tablet (40 mg total) by mouth daily.  Modified Medications   Modified Medication Previous Medication   DULAGLUTIDE  (TRULICITY ) 3 MG/0.5ML SOAJ Dulaglutide  (TRULICITY ) 3 MG/0.5ML SOAJ      Inject 3 mg into the skin once a week.    Inject 3 mg into the skin once a week.   EMPAGLIFLOZIN -METFORMIN  HCL ER (SYNJARDY  XR) 12.02-999 MG TB24 Empagliflozin -metFORMIN  HCl ER (SYNJARDY  XR) 12.02-999 MG TB24      Take 1 tablet by mouth 2 (two) times daily.    Take 1 tablet by mouth 2 (two) times daily.   INSULIN  ISOPHANE & REGULAR HUMAN KWIKPEN (HUMULIN  70/30 KWIKPEN) (70-30) 100 UNIT/ML KWIKPEN insulin  isophane & regular human KwikPen (HUMULIN  70/30 KWIKPEN) (70-30) 100 UNIT/ML KwikPen      Inject 25 Units into the skin daily before  breakfast AND 16 Units daily before supper.    Inject 45 Units into the skin daily before breakfast AND 25 Units daily before supper.  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  Social History Social History   Socioeconomic History   Marital status: Married    Spouse name: Not on file   Number of children: Not on file   Years of education: Not on file   Highest education level: Not on file  Occupational History   Not on file  Tobacco Use   Smoking status: Every Day    Current packs/day: 0.55    Average packs/day: 0.6 packs/day for 51.0 years (28.1 ttl pk-yrs)    Types: Cigarettes    Passive exposure: Never   Smokeless tobacco: Never   Tobacco comments:    electronic cigarettes, 6/1/2020pt states on and off  Vaping Use   Vaping status: Never Used  Substance and Sexual Activity   Alcohol use: No    Alcohol/week: 0.0 standard drinks of alcohol   Drug use: No   Sexual activity: Not Currently    Birth control/protection: None  Other Topics Concern   Not on file  Social History Narrative   Not on file   Social Drivers of Health   Financial Resource Strain: Low Risk  (04/03/2023)   Overall Financial Resource Strain (CARDIA)    Difficulty of Paying Living Expenses: Not hard at all  Food Insecurity: No Food Insecurity (04/03/2023)   Hunger Vital Sign    Worried About Running Out of Food in the Last Year: Never true     Ran Out of Food in the  Last Year: Never true  Transportation Needs: No Transportation Needs (04/03/2023)   PRAPARE - Administrator, Civil Service (Medical): No    Lack of Transportation (Non-Medical): No  Physical Activity: Sufficiently Active (04/03/2023)   Exercise Vital Sign    Days of Exercise per Week: 2 days    Minutes of Exercise per Session: 110 min  Stress: No Stress Concern Present (04/03/2023)   [employer] of Occupational Health - Occupational Stress Questionnaire    Feeling of Stress : Not at all  Social Connections: Moderately Isolated (04/03/2023)   Social Connection and Isolation Panel    Frequency of Communication with Friends and Family: Once a week    Frequency of Social Gatherings with Friends and Family: Twice a week    Attends Religious Services: Never    Database administrator or Organizations: No    Attends Banker Meetings: Never    Marital Status: Married  Catering manager Violence: Not At Risk (04/03/2023)   Humiliation, Afraid, Rape, and Kick questionnaire    Fear of Current or Ex-Partner: No    Emotionally Abused: No    Physically Abused: No    Sexually Abused: No    Lab Results  Component Value Date   HGBA1C 6.5 (A) 04/01/2024   HGBA1C 7.0 (A) 01/07/2024   HGBA1C 8.3 (A) 09/10/2023   Lab Results  Component Value Date   CHOL 98 03/27/2024   Lab Results  Component Value Date   HDL 35 (L) 03/27/2024   Lab Results  Component Value Date   LDLCALC 44 03/27/2024   Lab Results  Component Value Date   TRIG 106 03/27/2024   Lab Results  Component Value Date   CHOLHDL 2.8 03/27/2024   Lab Results  Component Value Date   CREATININE 0.78 01/23/2024   Lab Results  Component Value Date   GFR 91.90 08/16/2023   Lab Results  Component Value Date   MICROALBUR 7.0 03/27/2024      Component Value Date/Time   NA 143 01/23/2024 1006   K 4.9 01/23/2024 1006   CL 103 01/23/2024 1006   CO2 24 01/23/2024 1006    GLUCOSE 89 01/23/2024 1006   GLUCOSE 172 (H) 08/16/2023 1218   GLUCOSE 94 09/10/2006 1325   BUN 21 01/23/2024 1006   CREATININE 0.78 01/23/2024 1006   CALCIUM  9.7 01/23/2024 1006   PROT 7.2 08/16/2023 1218   PROT 7.4 02/26/2023 0935   ALBUMIN 4.5 08/16/2023 1218   ALBUMIN 4.6 02/26/2023 0935   AST 20 08/16/2023 1218   ALT 28 08/16/2023 1218   ALKPHOS 60 08/16/2023 1218   BILITOT 0.5 08/16/2023 1218   BILITOT 0.5 02/26/2023 0935   GFRNONAA 93 02/12/2020 0939   GFRAA 107 02/12/2020 0939      Latest Ref Rng & Units 01/23/2024   10:06 AM 08/16/2023   12:18 PM 02/26/2023    9:35 AM  BMP  Glucose 70 - 99 mg/dL 89  827  841   BUN 8 - 27 mg/dL 21  17  15    Creatinine 0.76 - 1.27 mg/dL 9.21  9.17  9.26   BUN/Creat Ratio 10 - 24 27   21    Sodium 134 - 144 mmol/L 143  141  142   Potassium 3.5 - 5.2 mmol/L 4.9  4.3  4.6   Chloride 96 - 106 mmol/L 103  104  101   CO2 20 - 29 mmol/L 24  28  23    Calcium  8.6 -  10.2 mg/dL 9.7  9.5  9.8        Component Value Date/Time   WBC 9.2 02/26/2023 0935   WBC 6.8 07/01/2018 0906   RBC 5.32 02/26/2023 0935   RBC 4.99 07/01/2018 0906   HGB 16.4 02/26/2023 0935   HCT 47.4 02/26/2023 0935   PLT 256 02/26/2023 0935   MCV 89 02/26/2023 0935   MCH 30.8 02/26/2023 0935   MCHC 34.6 02/26/2023 0935   MCHC 34.7 07/01/2018 0906   RDW 12.9 02/26/2023 0935   LYMPHSABS 1.4 02/26/2023 0935   MONOABS 0.6 07/01/2018 0906   EOSABS 0.2 02/26/2023 0935   BASOSABS 0.0 02/26/2023 0935     Parts of this note may have been dictated using voice recognition software. There may be variances in spelling and vocabulary which are unintentional. Not all errors are proofread. Please notify the dino if any discrepancies are noted or if the meaning of any statement is not clear.

## 2024-06-24 NOTE — Patient Instructions (Signed)
 Will recommend the following: Humulin  70/30 insulin : 25 units before breakfast and 16 units before supper 15 min before meals  Synjardy  12.5/1000mg  bid  Trulicity  3 mg/week  __________   Goals of DM therapy:  Morning Fasting blood sugar: 80-140  Blood sugar before meals: 80-140 Bed time blood sugar: 100-150  A1C <7%, limited only by hypoglycemia  1.Diabetes medications and their side effects discussed, including hypoglycemia    2. Check blood glucose:  a) Always check blood sugars before driving. Please see below (under hypoglycemia) on how to manage b) Check a minimum of 3 times/day or more as needed when having symptoms of hypoglycemia.   c) Try to check blood glucose before sleeping/in the middle of the night to ensure that it is remaining stable and not dropping less than 100 d) Check blood glucose more often if sick  3. Diet: a) 3 meals per day schedule b: Restrict carbs to 60-70 grams (4 servings) per meal c) Colorful vegetables - 3 servings a day, and low sugar fruit 2 servings/day Plate control method: 1/4 plate protein, 1/4 starch, 1/2 green, yellow, or red vegetables d) Avoid carbohydrate snacks unless hypoglycemic episode, or increased physical activity  4. Regular exercise as tolerated, preferably 3 or more hours a week  5. Hypoglycemia: a)  Do not drive or operate machinery without first testing blood glucose to assure it is over 90 mg%, or if dizzy, lightheaded, not feeling normal, etc, or  if foot or leg is numb or weak. b)  If blood glucose less than 70, take four 5gm Glucose tabs or 15-30 gm Glucose gel.  Repeat every 15 min as needed until blood sugar is >100 mg/dl. If hypoglycemia persists then call 911.   6. Sick day management: a) Check blood glucose more often b) Continue usual therapy if blood sugars are elevated.   7. Contact the doctor immediately if blood glucose is frequently <60 mg/dl, or an episode of severe hypoglycemia occurs (where someone had  to give you glucose/  glucagon or if you passed out from a low blood glucose), or if blood glucose is persistently >350 mg/dl, for further management  8. A change in level of physical activity or exercise and a change in diet may also affect your blood sugar. Check blood sugars more often and call if needed.  Instructions: 1. Bring glucose meter, blood glucose records on every visit for review 2. Continue to follow up with primary care physician and other providers for medical care 3. Yearly eye  and foot exam 4. Please get blood work done prior to the next appointment

## 2024-06-26 ENCOUNTER — Encounter: Payer: Self-pay | Admitting: "Endocrinology

## 2024-06-30 ENCOUNTER — Other Ambulatory Visit: Payer: Self-pay | Admitting: Family Medicine

## 2024-06-30 ENCOUNTER — Other Ambulatory Visit (HOSPITAL_COMMUNITY): Payer: Self-pay

## 2024-06-30 ENCOUNTER — Other Ambulatory Visit: Payer: Self-pay

## 2024-06-30 ENCOUNTER — Other Ambulatory Visit: Payer: Self-pay | Admitting: "Endocrinology

## 2024-06-30 MED ORDER — LOSARTAN POTASSIUM-HCTZ 100-12.5 MG PO TABS
1.0000 | ORAL_TABLET | Freq: Every day | ORAL | 1 refills | Status: AC
  Filled 2024-07-08: qty 30, 30d supply, fill #0
  Filled 2024-08-07: qty 30, 30d supply, fill #1
  Filled 2024-09-03: qty 30, 30d supply, fill #2
  Filled 2024-10-13: qty 30, 30d supply, fill #3
  Filled 2024-11-12: qty 30, 30d supply, fill #4

## 2024-06-30 MED ORDER — ROSUVASTATIN CALCIUM 40 MG PO TABS
40.0000 mg | ORAL_TABLET | Freq: Every day | ORAL | 3 refills | Status: AC
  Filled 2024-07-08: qty 30, 30d supply, fill #0
  Filled 2024-08-07: qty 30, 30d supply, fill #1
  Filled 2024-09-03: qty 30, 30d supply, fill #2
  Filled 2024-10-13: qty 30, 30d supply, fill #3
  Filled 2024-11-12: qty 30, 30d supply, fill #4

## 2024-06-30 NOTE — Telephone Encounter (Signed)
 Refill request complete

## 2024-07-01 ENCOUNTER — Other Ambulatory Visit: Payer: Self-pay

## 2024-07-01 DIAGNOSIS — M19012 Primary osteoarthritis, left shoulder: Secondary | ICD-10-CM | POA: Diagnosis not present

## 2024-07-03 ENCOUNTER — Ambulatory Visit: Admitting: "Endocrinology

## 2024-07-03 ENCOUNTER — Other Ambulatory Visit (HOSPITAL_COMMUNITY): Payer: Self-pay

## 2024-07-08 ENCOUNTER — Other Ambulatory Visit: Payer: Self-pay

## 2024-07-16 ENCOUNTER — Other Ambulatory Visit (HOSPITAL_COMMUNITY): Payer: Self-pay

## 2024-07-30 ENCOUNTER — Other Ambulatory Visit: Payer: Self-pay

## 2024-08-07 ENCOUNTER — Other Ambulatory Visit: Payer: Self-pay

## 2024-08-08 ENCOUNTER — Other Ambulatory Visit: Payer: Self-pay

## 2024-08-11 ENCOUNTER — Other Ambulatory Visit: Payer: Self-pay

## 2024-08-13 ENCOUNTER — Other Ambulatory Visit: Payer: Self-pay

## 2024-08-21 ENCOUNTER — Other Ambulatory Visit (HOSPITAL_COMMUNITY): Payer: Self-pay

## 2024-08-21 ENCOUNTER — Other Ambulatory Visit: Payer: Self-pay

## 2024-08-22 ENCOUNTER — Other Ambulatory Visit: Payer: Self-pay

## 2024-08-27 ENCOUNTER — Other Ambulatory Visit: Payer: Self-pay

## 2024-08-27 ENCOUNTER — Ambulatory Visit (INDEPENDENT_AMBULATORY_CARE_PROVIDER_SITE_OTHER): Admitting: "Endocrinology

## 2024-08-27 ENCOUNTER — Ambulatory Visit: Admitting: "Endocrinology

## 2024-08-27 ENCOUNTER — Encounter: Payer: Self-pay | Admitting: "Endocrinology

## 2024-08-27 ENCOUNTER — Other Ambulatory Visit (HOSPITAL_COMMUNITY): Payer: Self-pay

## 2024-08-27 VITALS — BP 104/72 | HR 80 | Ht 67.0 in | Wt 162.0 lb

## 2024-08-27 DIAGNOSIS — Z794 Long term (current) use of insulin: Secondary | ICD-10-CM | POA: Diagnosis not present

## 2024-08-27 DIAGNOSIS — E1165 Type 2 diabetes mellitus with hyperglycemia: Secondary | ICD-10-CM

## 2024-08-27 DIAGNOSIS — Z7985 Long-term (current) use of injectable non-insulin antidiabetic drugs: Secondary | ICD-10-CM

## 2024-08-27 DIAGNOSIS — E7800 Pure hypercholesterolemia, unspecified: Secondary | ICD-10-CM

## 2024-08-27 DIAGNOSIS — Z7984 Long term (current) use of oral hypoglycemic drugs: Secondary | ICD-10-CM

## 2024-08-27 LAB — POCT GLYCOSYLATED HEMOGLOBIN (HGB A1C): Hemoglobin A1C: 7.5 % — AB (ref 4.0–5.6)

## 2024-08-27 MED ORDER — TRULICITY 4.5 MG/0.5ML ~~LOC~~ SOAJ
4.5000 mg | SUBCUTANEOUS | 2 refills | Status: AC
  Filled 2024-08-27: qty 6, 84d supply, fill #0

## 2024-08-27 MED ORDER — HUMULIN 70/30 KWIKPEN (70-30) 100 UNIT/ML ~~LOC~~ SUPN
PEN_INJECTOR | SUBCUTANEOUS | 3 refills | Status: AC
  Filled 2024-08-27: qty 42, 96d supply, fill #0

## 2024-08-27 NOTE — Patient Instructions (Addendum)
 Will recommend the following: Humulin  70/30 insulin : 27 units before breakfast and 17 units before supper 15 min before meals  Synjardy  12.5/1000mg  bid  Trulicity  4.5 mg/week  ________   Goals of DM therapy:  Morning Fasting blood sugar: 80-140  Blood sugar before meals: 80-140 Bed time blood sugar: 100-150  A1C <7%, limited only by hypoglycemia  1.Diabetes medications and their side effects discussed, including hypoglycemia    2. Check blood glucose:  a) Always check blood sugars before driving. Please see below (under hypoglycemia) on how to manage b) Check a minimum of 3 times/day or more as needed when having symptoms of hypoglycemia.   c) Try to check blood glucose before sleeping/in the middle of the night to ensure that it is remaining stable and not dropping less than 100 d) Check blood glucose more often if sick  3. Diet: a) 3 meals per day schedule b: Restrict carbs to 60-70 grams (4 servings) per meal c) Colorful vegetables - 3 servings a day, and low sugar fruit 2 servings/day Plate control method: 1/4 plate protein, 1/4 starch, 1/2 green, yellow, or red vegetables d) Avoid carbohydrate snacks unless hypoglycemic episode, or increased physical activity  4. Regular exercise as tolerated, preferably 3 or more hours a week  5. Hypoglycemia: a)  Do not drive or operate machinery without first testing blood glucose to assure it is over 90 mg%, or if dizzy, lightheaded, not feeling normal, etc, or  if foot or leg is numb or weak. b)  If blood glucose less than 70, take four 5gm Glucose tabs or 15-30 gm Glucose gel.  Repeat every 15 min as needed until blood sugar is >100 mg/dl. If hypoglycemia persists then call 911.   6. Sick day management: a) Check blood glucose more often b) Continue usual therapy if blood sugars are elevated.   7. Contact the doctor immediately if blood glucose is frequently <60 mg/dl, or an episode of severe hypoglycemia occurs (where someone had  to give you glucose/  glucagon or if you passed out from a low blood glucose), or if blood glucose is persistently >350 mg/dl, for further management  8. A change in level of physical activity or exercise and a change in diet may also affect your blood sugar. Check blood sugars more often and call if needed.  Instructions: 1. Bring glucose meter, blood glucose records on every visit for review 2. Continue to follow up with primary care physician and other providers for medical care 3. Yearly eye  and foot exam 4. Please get blood work done prior to the next appointment

## 2024-08-27 NOTE — Progress Notes (Signed)
 Outpatient Endocrinology Note Tyler Birmingham, MD  08/27/24   Tyler Wolf 09-07-1956 [id number]  Referring Provider: Chandra Toribio POUR, MD Primary Care Provider: Chandra Toribio POUR, MD Reason for consultation: Subjective   Assessment & Plan  Diagnoses and all orders for this visit:  Uncontrolled type 2 diabetes mellitus with hyperglycemia, with long-term current use of insulin  (HCC) -     POCT glycosylated hemoglobin (Hb A1C) -     Dulaglutide  (TRULICITY ) 4.5 MG/0.5ML SOAJ; Inject 4.5 mg as directed once a week. -     insulin  isophane & regular human KwikPen (HUMULIN  70/30 KWIKPEN) (70-30) 100 UNIT/ML KwikPen; Inject 27 Units into the skin daily before breakfast AND 17 Units daily before supper.  Long term (current) use of oral hypoglycemic drugs  Long-term (current) use of injectable non-insulin  antidiabetic drugs  Long-term insulin  use (HCC)  Insulin  dose changed (HCC)  Pure hypercholesterolemia   Diabetes Type II complicated by hyperglycemia Lab Results  Component Value Date   GFR 91.90 08/16/2023   Hba1c goal less than 7, current Hba1c is  Lab Results  Component Value Date   HGBA1C 7.5 (A) 08/27/2024   Will recommend the following: Humulin  70/30 insulin : 27 units before breakfast and 17 units before supper 15 min before meals  Synjardy  12.5/1000mg  bid  Trulicity  4.5 mg/week Add a walk after dinner  Has DexCom   No known contraindications to any of above medications No history of MEN syndrome/medullary thyroid  cancer/pancreatitis or pancreatic cancer in self or family  -Last LD and Tg are as follows: Lab Results  Component Value Date   LDLCALC 44 03/27/2024    Lab Results  Component Value Date   TRIG 106 03/27/2024   -rosuvastatin  40 mg every day since 08/2023 -Follow low fat diet and exercise   -Blood pressure goal <140/90 - Microalbumin/creatinine goal is < 30 -Last MA/Cr is as follows: Lab Results  Component Value Date   MICROALBUR 7.0  03/27/2024   - on ACE/ARB losartan  100 mg qd -diet changes including salt restriction -limit eating outside -counseled BP targets per standards of diabetes care -uncontrolled blood pressure can lead to retinopathy, nephropathy and cardiovascular and atherosclerotic heart disease  Reviewed and counseled on: -A1C target -Blood sugar targets -Complications of uncontrolled diabetes  -Checking blood sugar before meals and bedtime and bring log next visit -All medications with mechanism of action and side effects -Hypoglycemia management: rule of 15's, Glucagon Emergency Kit and medical alert ID -low-carb low-fat plate-method diet -At least 20 minutes of physical activity per day -Annual dilated retinal eye exam and foot exam -compliance and follow up needs -follow up as scheduled or earlier if problem gets worse  Call if blood sugar is less than 70 or consistently above 250    Take a 15 gm snack of carbohydrate at bedtime before you go to sleep if your blood sugar is less than 100.    If you are going to fast after midnight for a test or procedure, ask your physician for instructions on how to reduce/decrease your insulin  dose.    Call if blood sugar is less than 70 or consistently above 250  -Treating a low sugar by rule of 15  (15 gms of sugar every 15 min until sugar is more than 70) If you feel your sugar is low, test your sugar to be sure If your sugar is low (less than 70), then take 15 grams of a fast acting Carbohydrate (3-4 glucose tablets or glucose gel or  4 ounces of juice or regular soda) Recheck your sugar 15 min after treating low to make sure it is more than 70 If sugar is still less than 70, treat again with 15 grams of carbohydrate          Don't drive the hour of hypoglycemia  If unconscious/unable to eat or drink by mouth, use glucagon injection or nasal spray baqsimi and call 911. Can repeat again in 15 min if still unconscious.  Return in about 6 weeks (around  10/08/2024).   I have reviewed current medications, nurse's notes, allergies, vital signs, past medical and surgical history, family medical history, and social history for this encounter. Counseled patient on symptoms, examination findings, lab findings, imaging results, treatment decisions and monitoring and prognosis. The patient understood the recommendations and agrees with the treatment plan. All questions regarding treatment plan were fully answered.  Tyler Birmingham, MD  08/27/24    History of Present Illness Tyler Wolf is a 68 y.o. year old male who presents for follow up of Type II diabetes mellitus.  Tyler Wolf was first diagnosed in 2009. Diabetes education +  Home diabetes regimen: Humulin  70/30 insulin : 25 units before breakfast and 16 units before supper Synjardy  12.5/1000mg  twice a day Trulicity  3 mg/week  BLOOD SUGAR DATA CGM interpretation: At today's visit, we reviewed her CGM downloads. The full report is scanned in the media. Reviewing the CGM trends, BG are elevated after meals, morning >evening, and improve overnight.  Physical Exam  BP 104/72   Pulse 80   Ht 5' 7 (1.702 m)   Wt 162 lb (73.5 kg)   SpO2 99%   BMI 25.37 kg/m    Constitutional: well developed, well nourished Head: normocephalic, atraumatic Eyes: sclera anicteric, no redness Neck: supple Lungs: normal respiratory effort Neurology: alert and oriented Skin: dry, no appreciable rashes Musculoskeletal: no appreciable defects Psychiatric: normal mood and affect Diabetic Foot Exam - Simple   No data filed      Current Medications Patient's Medications  New Prescriptions   DULAGLUTIDE  (TRULICITY ) 4.5 MG/0.5ML SOAJ    Inject 4.5 mg as directed once a week.  Previous Medications   ACETAMINOPHEN  EXTRA STRENGTH 500 MG CAPS    Take 2 capsules by mouth every 8 (eight) hours.   ASPIRIN 81 MG TABLET    Take 81 mg by mouth daily.   CELECOXIB (CELEBREX) 100 MG CAPSULE    Take 100 mg by  mouth 2 (two) times daily.   CHOLECALCIFEROL (VITAMIN D3) 25 MCG (1000 UNIT) TABLET    Take 2,000 Units by mouth daily.   CONTINUOUS GLUCOSE SENSOR (FREESTYLE LIBRE 3 PLUS SENSOR) MISC    Inject 1 Device into the skin continuous. Change every 15 days   CYCLOBENZAPRINE  (FLEXERIL ) 10 MG TABLET    Take 1 tablet (10 mg total) by mouth 2 (two) times daily as needed for muscle spasms.   EMPAGLIFLOZIN -METFORMIN  HCL ER (SYNJARDY  XR) 12.02-999 MG TB24    Take 1 tablet by mouth 2 (two) times daily.   FLUTICASONE  (FLONASE ) 50 MCG/ACT NASAL SPRAY    Place 2 sprays into both nostrils daily.   GLUCOSE BLOOD (CONTOUR NEXT TEST) TEST STRIP    1 each by Other route 2 (two) times daily. And lancets 2/day   INSULIN  PEN NEEDLE 31G X 5 MM MISC    Used to inject insulin    INSULIN  SYRINGE-NEEDLE U-100 (INSULIN  SYRINGE 1CC/30GX5/16) 30G X 5/16 1 ML MISC    1 Syringe by Does not  apply route daily. Use to inject insulin  daily   KETOCONAZOLE  (NIZORAL ) 2 % SHAMPOO    Apply daily to the face and hair in the shower (avoid eyes and mouth).  After rash has resolved you can continue using it twice a week   LOSARTAN -HYDROCHLOROTHIAZIDE  (HYZAAR ) 100-12.5 MG TABLET    Take 1 tablet by mouth daily.   ONDANSETRON (ZOFRAN-ODT) 4 MG DISINTEGRATING TABLET    Take 4 mg by mouth every 8 (eight) hours as needed.   OXYCODONE  (OXY IR/ROXICODONE ) 5 MG IMMEDIATE RELEASE TABLET    Take 5-10 mg by mouth every 6 (six) hours as needed.   ROSUVASTATIN  (CRESTOR ) 40 MG TABLET    Take 1 tablet (40 mg total) by mouth daily.  Modified Medications   Modified Medication Previous Medication   INSULIN  ISOPHANE & REGULAR HUMAN KWIKPEN (HUMULIN  70/30 KWIKPEN) (70-30) 100 UNIT/ML KWIKPEN insulin  isophane & regular human KwikPen (HUMULIN  70/30 KWIKPEN) (70-30) 100 UNIT/ML KwikPen      Inject 27 Units into the skin daily before breakfast AND 17 Units daily before supper.    Inject 25 Units into the skin daily before breakfast AND 16 Units daily before supper.   Discontinued Medications   DULAGLUTIDE  (TRULICITY ) 3 MG/0.5ML SOAJ    Inject 3 mg into the skin once a week.    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  Social History Social History   Socioeconomic History   Marital status: Married    Spouse name: Not on file   Number of children: Not on file   Years of education: Not on file   Highest education level: Not on file  Occupational History   Not on file  Tobacco Use   Smoking status: Every Day    Current packs/day: 0.55    Average packs/day: 0.6 packs/day for 51.0 years (28.1 ttl pk-yrs)    Types: Cigarettes    Passive exposure: Never   Smokeless tobacco: Never   Tobacco comments:    electronic cigarettes, 6/1/2020pt states on and off  Vaping Use   Vaping status: Never Used  Substance and Sexual Activity   Alcohol use: No    Alcohol/week: 0.0 standard drinks of alcohol   Drug use: No   Sexual activity: Not Currently    Birth control/protection: None  Other Topics Concern   Not on file  Social History Narrative   Not on file   Social Drivers of Health   Financial Resource Strain: Low Risk  (04/03/2023)   Overall Financial Resource Strain (CARDIA)    Difficulty of Paying Living Expenses: Not hard at all  Food Insecurity: No Food Insecurity (04/03/2023)   Hunger Vital Sign    Worried About Running Out of Food in the Last Year: Never true    Ran Out of Food in the Last Year: Never true  Transportation Needs: No  Transportation Needs (04/03/2023)   PRAPARE - Transportation    Lack of Transportation (Medical): No    Lack  of Transportation (Non-Medical): No  Physical Activity: Sufficiently Active (04/03/2023)   Exercise Vital Sign    Days of Exercise per Week: 2 days    Minutes of Exercise per Session: 110 min  Stress: No Stress Concern Present (04/03/2023)   [employer] of Occupational Health - Occupational Stress Questionnaire    Feeling of Stress : Not at all  Social Connections: Moderately Isolated (04/03/2023)   Social Connection and Isolation Panel    Frequency of Communication with Friends and Family: Once a week    Frequency of Social Gatherings with Friends and Family: Twice a week    Attends Religious Services: Never    Database Administrator or Organizations: No    Attends Banker Meetings: Never    Marital Status: Married  Catering Manager Violence: Not At Risk (04/03/2023)   Humiliation, Afraid, Rape, and Kick questionnaire    Fear of Current or Ex-Partner: No    Emotionally Abused: No    Physically Abused: No    Sexually Abused: No    Lab Results  Component Value Date   HGBA1C 7.5 (A) 08/27/2024   HGBA1C 6.5 (A) 04/01/2024   HGBA1C 7.0 (A) 01/07/2024   Lab Results  Component Value Date   CHOL 98 03/27/2024   Lab Results  Component Value Date   HDL 35 (L) 03/27/2024   Lab Results  Component Value Date   LDLCALC 44 03/27/2024   Lab Results  Component Value Date   TRIG 106 03/27/2024   Lab Results  Component Value Date   CHOLHDL 2.8 03/27/2024   Lab Results  Component Value Date   CREATININE 0.78 01/23/2024   Lab Results  Component Value Date   GFR 91.90 08/16/2023   Lab Results  Component Value Date   MICROALBUR 7.0 03/27/2024      Component Value Date/Time   NA 143 01/23/2024 1006   K 4.9 01/23/2024 1006   CL 103 01/23/2024 1006   CO2 24 01/23/2024 1006   GLUCOSE 89 01/23/2024 1006   GLUCOSE 172 (H) 08/16/2023 1218   GLUCOSE 94  09/10/2006 1325   BUN 21 01/23/2024 1006   CREATININE 0.78 01/23/2024 1006   CALCIUM  9.7 01/23/2024 1006   PROT 7.2 08/16/2023 1218   PROT 7.4 02/26/2023 0935   ALBUMIN 4.5 08/16/2023 1218   ALBUMIN 4.6 02/26/2023 0935   AST 20 08/16/2023 1218   ALT 28 08/16/2023 1218   ALKPHOS 60 08/16/2023 1218   BILITOT 0.5 08/16/2023 1218   BILITOT 0.5 02/26/2023 0935   GFRNONAA 93 02/12/2020 0939   GFRAA 107 02/12/2020 0939      Latest Ref Rng & Units 01/23/2024   10:06 AM 08/16/2023   12:18 PM 02/26/2023    9:35 AM  BMP  Glucose 70 - 99 mg/dL 89  827  841   BUN 8 - 27 mg/dL 21  17  15    Creatinine 0.76 - 1.27 mg/dL 9.21  9.17  9.26   BUN/Creat Ratio 10 - 24 27   21    Sodium 134 - 144 mmol/L 143  141  142   Potassium 3.5 - 5.2 mmol/L 4.9  4.3  4.6   Chloride 96 - 106 mmol/L 103  104  101   CO2 20 - 29 mmol/L 24  28  23    Calcium  8.6 - 10.2 mg/dL 9.7  9.5  9.8        Component Value Date/Time   WBC 9.2 02/26/2023 0935   WBC 6.8  07/01/2018 0906   RBC 5.32 02/26/2023 0935   RBC 4.99 07/01/2018 0906   HGB 16.4 02/26/2023 0935   HCT 47.4 02/26/2023 0935   PLT 256 02/26/2023 0935   MCV 89 02/26/2023 0935   MCH 30.8 02/26/2023 0935   MCHC 34.6 02/26/2023 0935   MCHC 34.7 07/01/2018 0906   RDW 12.9 02/26/2023 0935   LYMPHSABS 1.4 02/26/2023 0935   MONOABS 0.6 07/01/2018 0906   EOSABS 0.2 02/26/2023 0935   BASOSABS 0.0 02/26/2023 0935     Parts of this note may have been dictated using voice recognition software. There may be variances in spelling and vocabulary which are unintentional. Not all errors are proofread. Please notify the dino if any discrepancies are noted or if the meaning of any statement is not clear.

## 2024-08-28 ENCOUNTER — Other Ambulatory Visit: Payer: Self-pay

## 2024-09-03 ENCOUNTER — Other Ambulatory Visit: Payer: Self-pay

## 2024-09-04 ENCOUNTER — Other Ambulatory Visit: Payer: Self-pay

## 2024-09-05 ENCOUNTER — Other Ambulatory Visit: Payer: Self-pay

## 2024-09-08 ENCOUNTER — Ambulatory Visit (INDEPENDENT_AMBULATORY_CARE_PROVIDER_SITE_OTHER)

## 2024-09-08 DIAGNOSIS — Z23 Encounter for immunization: Secondary | ICD-10-CM

## 2024-09-12 ENCOUNTER — Other Ambulatory Visit (HOSPITAL_COMMUNITY): Payer: Self-pay

## 2024-09-16 ENCOUNTER — Other Ambulatory Visit: Payer: Self-pay

## 2024-09-16 ENCOUNTER — Encounter: Payer: Self-pay | Admitting: "Endocrinology

## 2024-09-16 ENCOUNTER — Other Ambulatory Visit (HOSPITAL_COMMUNITY): Payer: Self-pay

## 2024-09-16 ENCOUNTER — Ambulatory Visit (INDEPENDENT_AMBULATORY_CARE_PROVIDER_SITE_OTHER): Admitting: "Endocrinology

## 2024-09-16 VITALS — BP 139/70 | HR 73 | Ht 67.0 in | Wt 167.0 lb

## 2024-09-16 DIAGNOSIS — Z7984 Long term (current) use of oral hypoglycemic drugs: Secondary | ICD-10-CM | POA: Diagnosis not present

## 2024-09-16 DIAGNOSIS — E1165 Type 2 diabetes mellitus with hyperglycemia: Secondary | ICD-10-CM | POA: Diagnosis not present

## 2024-09-16 DIAGNOSIS — E7800 Pure hypercholesterolemia, unspecified: Secondary | ICD-10-CM | POA: Diagnosis not present

## 2024-09-16 DIAGNOSIS — Z7985 Long-term (current) use of injectable non-insulin antidiabetic drugs: Secondary | ICD-10-CM | POA: Diagnosis not present

## 2024-09-16 DIAGNOSIS — Z794 Long term (current) use of insulin: Secondary | ICD-10-CM | POA: Diagnosis not present

## 2024-09-16 MED ORDER — PEN NEEDLES 32G X 4 MM MISC
1.0000 | Freq: Four times a day (QID) | 11 refills | Status: AC
  Filled 2024-09-16: qty 200, 50d supply, fill #0

## 2024-09-16 NOTE — Progress Notes (Signed)
 Outpatient Endocrinology Note Tyler Birmingham, MD  09/16/24   Tyler Wolf 1956/05/20 [id number]  Referring Provider: Chandra Toribio POUR, MD Primary Care Provider: Chandra Toribio POUR, MD Reason for consultation: Subjective   Assessment & Plan  Diagnoses and all orders for this visit:  Uncontrolled type 2 diabetes mellitus with hyperglycemia, with long-term current use of insulin  (HCC)  Long term (current) use of oral hypoglycemic drugs  Long-term (current) use of injectable non-insulin  antidiabetic drugs  Long-term insulin  use (HCC)  Insulin  dose changed (HCC)  Pure hypercholesterolemia  Other orders -     Insulin  Pen Needle (PEN NEEDLES) 32G X 4 MM MISC; 1 Device by Does not apply route in the morning, at noon, in the evening, and at bedtime.    Diabetes Type II complicated by hyperglycemia Lab Results  Component Value Date   GFR 91.90 08/16/2023   Hba1c goal less than 7, current Hba1c is  Lab Results  Component Value Date   HGBA1C 7.5 (A) 08/27/2024   Will recommend the following: Humulin  70/30 insulin : 27 units before breakfast and 18 units before supper 15 min before meals  Synjardy  12.5/1000mg  bid  Trulicity  4.5 mg/week  Has DexCom   No known contraindications to any of above medications No history of MEN syndrome/medullary thyroid  cancer/pancreatitis or pancreatic cancer in self or family  -Last LD and Tg are as follows: Lab Results  Component Value Date   LDLCALC 44 03/27/2024    Lab Results  Component Value Date   TRIG 106 03/27/2024   -rosuvastatin  40 mg every day since 08/2023 -Follow low fat diet and exercise   -Blood pressure goal <140/90 - Microalbumin/creatinine goal is < 30 -Last MA/Cr is as follows: Lab Results  Component Value Date   MICROALBUR 7.0 03/27/2024   - on ACE/ARB losartan  100 mg qd -diet changes including salt restriction -limit eating outside -counseled BP targets per standards of diabetes care -uncontrolled  blood pressure can lead to retinopathy, nephropathy and cardiovascular and atherosclerotic heart disease  Reviewed and counseled on: -A1C target -Blood sugar targets -Complications of uncontrolled diabetes  -Checking blood sugar before meals and bedtime and bring log next visit -All medications with mechanism of action and side effects -Hypoglycemia management: rule of 15's, Glucagon Emergency Kit and medical alert ID -low-carb low-fat plate-method diet -At least 20 minutes of physical activity per day -Annual dilated retinal eye exam and foot exam -compliance and follow up needs -follow up as scheduled or earlier if problem gets worse  Call if blood sugar is less than 70 or consistently above 250    Take a 15 gm snack of carbohydrate at bedtime before you go to sleep if your blood sugar is less than 100.    If you are going to fast after midnight for a test or procedure, ask your physician for instructions on how to reduce/decrease your insulin  dose.    Call if blood sugar is less than 70 or consistently above 250  -Treating a low sugar by rule of 15  (15 gms of sugar every 15 min until sugar is more than 70) If you feel your sugar is low, test your sugar to be sure If your sugar is low (less than 70), then take 15 grams of a fast acting Carbohydrate (3-4 glucose tablets or glucose gel or 4 ounces of juice or regular soda) Recheck your sugar 15 min after treating low to make sure it is more than 70 If sugar is still less than  70, treat again with 15 grams of carbohydrate          Don't drive the hour of hypoglycemia  If unconscious/unable to eat or drink by mouth, use glucagon injection or nasal spray baqsimi and call 911. Can repeat again in 15 min if still unconscious.  Return in about 6 weeks (around 10/28/2024).   I have reviewed current medications, nurse's notes, allergies, vital signs, past medical and surgical history, family medical history, and social history for this  encounter. Counseled patient on symptoms, examination findings, lab findings, imaging results, treatment decisions and monitoring and prognosis. The patient understood the recommendations and agrees with the treatment plan. All questions regarding treatment plan were fully answered.  Tyler Birmingham, MD  09/16/24    History of Present Illness Tyler Wolf is a 68 y.o. year old male who presents for follow up of Type II diabetes mellitus.  Tyler Wolf was first diagnosed in 2009. Diabetes education +  Home diabetes regimen: Humulin  70/30 insulin : 25 units before breakfast and 17 units before supper Synjardy  12.5/1000mg  twice a day Trulicity  4.5 mg/week  BLOOD SUGAR DATA CGM interpretation: At today's visit, we reviewed her CGM downloads. The full report is scanned in the media. Reviewing the CGM trends, BG are elevated after meals, morning >evening, and normalize overnight.  Physical Exam  BP 139/70   Pulse 73   Ht 5' 7 (1.702 m)   Wt 167 lb (75.8 kg)   SpO2 97%   BMI 26.16 kg/m    Constitutional: well developed, well nourished Head: normocephalic, atraumatic Eyes: sclera anicteric, no redness Neck: supple Lungs: normal respiratory effort Neurology: alert and oriented Skin: dry, no appreciable rashes Musculoskeletal: no appreciable defects Psychiatric: normal mood and affect Diabetic Foot Exam - Simple   No data filed      Current Medications Patient's Medications  New Prescriptions   INSULIN  PEN NEEDLE (PEN NEEDLES) 32G X 4 MM MISC    1 Device by Does not apply route in the morning, at noon, in the evening, and at bedtime.  Previous Medications   ACETAMINOPHEN  EXTRA STRENGTH 500 MG CAPS    Take 2 capsules by mouth every 8 (eight) hours.   ASPIRIN 81 MG TABLET    Take 81 mg by mouth daily.   CELECOXIB (CELEBREX) 100 MG CAPSULE    Take 100 mg by mouth 2 (two) times daily.   CHOLECALCIFEROL (VITAMIN D3) 25 MCG (1000 UNIT) TABLET    Take 2,000 Units by mouth daily.    CONTINUOUS GLUCOSE SENSOR (FREESTYLE LIBRE 3 PLUS SENSOR) MISC    Inject 1 Device into the skin continuous. Change every 15 days   CYCLOBENZAPRINE  (FLEXERIL ) 10 MG TABLET    Take 1 tablet (10 mg total) by mouth 2 (two) times daily as needed for muscle spasms.   DULAGLUTIDE  (TRULICITY ) 4.5 MG/0.5ML SOAJ    Inject 4.5 mg as directed once a week.   EMPAGLIFLOZIN -METFORMIN  HCL ER (SYNJARDY  XR) 12.02-999 MG TB24    Take 1 tablet by mouth 2 (two) times daily.   FLUTICASONE  (FLONASE ) 50 MCG/ACT NASAL SPRAY    Place 2 sprays into both nostrils daily.   GLUCOSE BLOOD (CONTOUR NEXT TEST) TEST STRIP    1 each by Other route 2 (two) times daily. And lancets 2/day   INSULIN  ISOPHANE & REGULAR HUMAN KWIKPEN (HUMULIN  70/30 KWIKPEN) (70-30) 100 UNIT/ML KWIKPEN    Inject 27 Units into the skin daily before breakfast AND 17 Units daily before supper.  INSULIN  PEN NEEDLE 31G X 5 MM MISC    Used to inject insulin    INSULIN  SYRINGE-NEEDLE U-100 (INSULIN  SYRINGE 1CC/30GX5/16) 30G X 5/16 1 ML MISC    1 Syringe by Does not apply route daily. Use to inject insulin  daily   KETOCONAZOLE  (NIZORAL ) 2 % SHAMPOO    Apply daily to the face and hair in the shower (avoid eyes and mouth).  After rash has resolved you can continue using it twice a week   LOSARTAN -HYDROCHLOROTHIAZIDE  (HYZAAR ) 100-12.5 MG TABLET    Take 1 tablet by mouth daily.   ONDANSETRON (ZOFRAN-ODT) 4 MG DISINTEGRATING TABLET    Take 4 mg by mouth every 8 (eight) hours as needed.   OXYCODONE  (OXY IR/ROXICODONE ) 5 MG IMMEDIATE RELEASE TABLET    Take 5-10 mg by mouth every 6 (six) hours as needed.   ROSUVASTATIN  (CRESTOR ) 40 MG TABLET    Take 1 tablet (40 mg total) by mouth daily.  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  Social History Social History   Socioeconomic History   Marital status: Married    Spouse name: Not on file   Number of children: Not on file   Years of education: Not on file   Highest education level: Not on file  Occupational History   Not on file  Tobacco Use   Smoking status: Every Day    Current packs/day: 0.55    Average packs/day: 0.6 packs/day for 51.0 years (28.1 ttl pk-yrs)    Types: Cigarettes    Passive exposure: Never   Smokeless tobacco: Never   Tobacco comments:    electronic cigarettes, 6/1/2020pt states on and off  Vaping Use   Vaping status: Never Used  Substance and Sexual Activity   Alcohol use: No    Alcohol/week: 0.0 standard drinks of alcohol   Drug use: No   Sexual activity: Not Currently    Birth control/protection: None  Other Topics Concern   Not on file  Social History Narrative   Not on file   Social Drivers of Health   Financial Resource Strain: Low Risk  (04/03/2023)   Overall Financial Resource Strain (CARDIA)    Difficulty of Paying Living Expenses: Not hard at all  Food Insecurity: No Food Insecurity (04/03/2023)   Hunger Vital Sign    Worried About Running Out of Food in the Last Year: Never true    Ran Out of Food in the Last Year: Never true  Transportation Needs: No Transportation Needs (04/03/2023)   PRAPARE - Administrator, Civil Service (Medical): No    Lack of Transportation (Non-Medical): No  Physical Activity: Sufficiently Active (04/03/2023)   Exercise Vital Sign    Days of Exercise per Week: 2 days    Minutes of Exercise per Session: 110  min  Stress: No Stress Concern Present (04/03/2023)  [employer] of Occupational Health - Occupational Stress Questionnaire    Feeling of Stress : Not at all  Social Connections: Moderately Isolated (04/03/2023)   Social Connection and Isolation Panel    Frequency of Communication with Friends and Family: Once a week    Frequency of Social Gatherings with Friends and Family: Twice a week    Attends Religious Services: Never    Database Administrator or Organizations: No    Attends Banker Meetings: Never    Marital Status: Married  Catering Manager Violence: Not At Risk (04/03/2023)   Humiliation, Afraid, Rape, and Kick questionnaire    Fear of Current or Ex-Partner: No    Emotionally Abused: No    Physically Abused: No    Sexually Abused: No    Lab Results  Component Value Date   HGBA1C 7.5 (A) 08/27/2024   HGBA1C 6.5 (A) 04/01/2024   HGBA1C 7.0 (A) 01/07/2024   Lab Results  Component Value Date   CHOL 98 03/27/2024   Lab Results  Component Value Date   HDL 35 (L) 03/27/2024   Lab Results  Component Value Date   LDLCALC 44 03/27/2024   Lab Results  Component Value Date   TRIG 106 03/27/2024   Lab Results  Component Value Date   CHOLHDL 2.8 03/27/2024   Lab Results  Component Value Date   CREATININE 0.78 01/23/2024   Lab Results  Component Value Date   GFR 91.90 08/16/2023   Lab Results  Component Value Date   MICROALBUR 7.0 03/27/2024      Component Value Date/Time   NA 143 01/23/2024 1006   K 4.9 01/23/2024 1006   CL 103 01/23/2024 1006   CO2 24 01/23/2024 1006   GLUCOSE 89 01/23/2024 1006   GLUCOSE 172 (H) 08/16/2023 1218   GLUCOSE 94 09/10/2006 1325   BUN 21 01/23/2024 1006   CREATININE 0.78 01/23/2024 1006   CALCIUM  9.7 01/23/2024 1006   PROT 7.2 08/16/2023 1218   PROT 7.4 02/26/2023 0935   ALBUMIN 4.5 08/16/2023 1218   ALBUMIN 4.6 02/26/2023 0935   AST 20 08/16/2023 1218   ALT 28 08/16/2023 1218   ALKPHOS 60 08/16/2023  1218   BILITOT 0.5 08/16/2023 1218   BILITOT 0.5 02/26/2023 0935   GFRNONAA 93 02/12/2020 0939   GFRAA 107 02/12/2020 0939      Latest Ref Rng & Units 01/23/2024   10:06 AM 08/16/2023   12:18 PM 02/26/2023    9:35 AM  BMP  Glucose 70 - 99 mg/dL 89  827  841   BUN 8 - 27 mg/dL 21  17  15    Creatinine 0.76 - 1.27 mg/dL 9.21  9.17  9.26   BUN/Creat Ratio 10 - 24 27   21    Sodium 134 - 144 mmol/L 143  141  142   Potassium 3.5 - 5.2 mmol/L 4.9  4.3  4.6   Chloride 96 - 106 mmol/L 103  104  101   CO2 20 - 29 mmol/L 24  28  23    Calcium  8.6 - 10.2 mg/dL 9.7  9.5  9.8        Component Value Date/Time   WBC 9.2 02/26/2023 0935   WBC 6.8 07/01/2018 0906   RBC 5.32 02/26/2023 0935   RBC 4.99 07/01/2018 0906   HGB 16.4 02/26/2023 0935   HCT 47.4 02/26/2023 0935   PLT 256 02/26/2023 0935   MCV 89 02/26/2023 0935   MCH 30.8 02/26/2023 0935  MCHC 34.6 02/26/2023 0935   MCHC 34.7 07/01/2018 0906   RDW 12.9 02/26/2023 0935   LYMPHSABS 1.4 02/26/2023 0935   MONOABS 0.6 07/01/2018 0906   EOSABS 0.2 02/26/2023 0935   BASOSABS 0.0 02/26/2023 0935     Parts of this note may have been dictated using voice recognition software. There may be variances in spelling and vocabulary which are unintentional. Not all errors are proofread. Please notify the dino if any discrepancies are noted or if the meaning of any statement is not clear.

## 2024-09-16 NOTE — Patient Instructions (Addendum)
 Will recommend the following: Humulin  70/30 insulin : 27 units before breakfast and 18 units before supper 15 min before meals  Synjardy  12.5/1000mg  bid  Trulicity  4.5 mg/week

## 2024-10-03 ENCOUNTER — Other Ambulatory Visit: Payer: Self-pay | Admitting: "Endocrinology

## 2024-10-03 ENCOUNTER — Other Ambulatory Visit (HOSPITAL_COMMUNITY): Payer: Self-pay

## 2024-10-03 DIAGNOSIS — E1165 Type 2 diabetes mellitus with hyperglycemia: Secondary | ICD-10-CM

## 2024-10-03 MED ORDER — FREESTYLE LIBRE 3 PLUS SENSOR MISC
1.0000 | 3 refills | Status: AC
  Filled 2024-10-03: qty 6, 90d supply, fill #0

## 2024-10-03 NOTE — Telephone Encounter (Signed)
 Requested Prescriptions   Pending Prescriptions Disp Refills   Continuous Glucose Sensor (FREESTYLE LIBRE 3 PLUS SENSOR) MISC 6 each 3    Sig: Inject 1 Device into the skin continuous. Change every 15 days

## 2024-10-13 ENCOUNTER — Other Ambulatory Visit: Payer: Self-pay

## 2024-10-14 ENCOUNTER — Ambulatory Visit: Admitting: "Endocrinology

## 2024-10-15 ENCOUNTER — Other Ambulatory Visit (HOSPITAL_COMMUNITY): Payer: Self-pay

## 2024-10-20 ENCOUNTER — Other Ambulatory Visit

## 2024-10-20 DIAGNOSIS — Z125 Encounter for screening for malignant neoplasm of prostate: Secondary | ICD-10-CM

## 2024-10-20 DIAGNOSIS — I10 Essential (primary) hypertension: Secondary | ICD-10-CM

## 2024-10-20 DIAGNOSIS — Z794 Long term (current) use of insulin: Secondary | ICD-10-CM

## 2024-10-20 DIAGNOSIS — E1169 Type 2 diabetes mellitus with other specified complication: Secondary | ICD-10-CM

## 2024-10-21 LAB — LIPID PANEL
Chol/HDL Ratio: 3.2 ratio (ref 0.0–5.0)
Cholesterol, Total: 145 mg/dL (ref 100–199)
HDL: 45 mg/dL
LDL Chol Calc (NIH): 83 mg/dL (ref 0–99)
Triglycerides: 89 mg/dL (ref 0–149)
VLDL Cholesterol Cal: 17 mg/dL (ref 5–40)

## 2024-10-21 LAB — COMPREHENSIVE METABOLIC PANEL WITH GFR
ALT: 30 IU/L (ref 0–44)
AST: 25 IU/L (ref 0–40)
Albumin: 4.7 g/dL (ref 3.9–4.9)
Alkaline Phosphatase: 63 IU/L (ref 47–123)
BUN/Creatinine Ratio: 27 — ABNORMAL HIGH (ref 10–24)
BUN: 21 mg/dL (ref 8–27)
Bilirubin Total: 0.4 mg/dL (ref 0.0–1.2)
CO2: 21 mmol/L (ref 20–29)
Calcium: 9.4 mg/dL (ref 8.6–10.2)
Chloride: 105 mmol/L (ref 96–106)
Creatinine, Ser: 0.79 mg/dL (ref 0.76–1.27)
Globulin, Total: 2.2 g/dL (ref 1.5–4.5)
Glucose: 110 mg/dL — ABNORMAL HIGH (ref 70–99)
Potassium: 4.9 mmol/L (ref 3.5–5.2)
Sodium: 142 mmol/L (ref 134–144)
Total Protein: 6.9 g/dL (ref 6.0–8.5)
eGFR: 97 mL/min/1.73

## 2024-10-21 LAB — PSA: Prostate Specific Ag, Serum: 1.2 ng/mL (ref 0.0–4.0)

## 2024-10-24 ENCOUNTER — Encounter: Payer: Self-pay | Admitting: Family Medicine

## 2024-10-24 ENCOUNTER — Ambulatory Visit: Admitting: Family Medicine

## 2024-10-24 VITALS — BP 110/73 | HR 73 | Ht 67.0 in | Wt 164.8 lb

## 2024-10-24 DIAGNOSIS — E782 Mixed hyperlipidemia: Secondary | ICD-10-CM

## 2024-10-24 DIAGNOSIS — E1169 Type 2 diabetes mellitus with other specified complication: Secondary | ICD-10-CM

## 2024-10-24 DIAGNOSIS — I152 Hypertension secondary to endocrine disorders: Secondary | ICD-10-CM

## 2024-10-24 DIAGNOSIS — Z716 Tobacco abuse counseling: Secondary | ICD-10-CM

## 2024-10-24 DIAGNOSIS — Z720 Tobacco use: Secondary | ICD-10-CM

## 2024-10-24 DIAGNOSIS — Z7984 Long term (current) use of oral hypoglycemic drugs: Secondary | ICD-10-CM | POA: Diagnosis not present

## 2024-10-24 DIAGNOSIS — Z7985 Long-term (current) use of injectable non-insulin antidiabetic drugs: Secondary | ICD-10-CM | POA: Diagnosis not present

## 2024-10-24 DIAGNOSIS — E1159 Type 2 diabetes mellitus with other circulatory complications: Secondary | ICD-10-CM | POA: Diagnosis not present

## 2024-10-24 DIAGNOSIS — Z794 Long term (current) use of insulin: Secondary | ICD-10-CM | POA: Diagnosis not present

## 2024-10-24 DIAGNOSIS — E1165 Type 2 diabetes mellitus with hyperglycemia: Secondary | ICD-10-CM

## 2024-10-24 DIAGNOSIS — E119 Type 2 diabetes mellitus without complications: Secondary | ICD-10-CM

## 2024-10-24 NOTE — Patient Instructions (Signed)
" °  VISIT SUMMARY: During your visit, we reviewed your diabetes, cholesterol, blood pressure, and smoking habits. We discussed your current treatments and made some recommendations to help manage your conditions.  YOUR PLAN: TYPE 2 DIABETES MELLITUS: Your A1c level has increased to 7.5%, and you experience occasional nocturnal hypoglycemia. -Continue your current insulin  regimen as managed by your endocrinologist. -Continue taking Trulicity  as prescribed. -Monitor your blood glucose levels with your continuous glucose monitor.  HYPERLIPIDEMIA WITH CORONARY ARTERY DISEASE: Your LDL cholesterol level is 83 mg/dL, and you are on the maximum dose of Crestor . -Continue taking Crestor  at the current dose. - consider adding zetia, an oral medication, or repatha, an injection to lower your risk of heart attack or stroke -Consider a coronary artery calcium  score if further evaluation is desired.  HYPERTENSION: Your blood pressure is managed with losartan  and hydrochlorothiazide . -Continue taking losartan  and hydrochlorothiazide  as prescribed.  NICOTINE  DEPENDENCE, CIGARETTES: You smoke eight cigarettes per day and have discussed the benefits of quitting. -Attempt smoking cessation using sugar-free gum or other non-inhalation methods. -I will Explore insurance coverage for medical-grade e-cigarettes for smoking cessation.  GENERAL HEALTH MAINTENANCE: Routine health maintenance was discussed, and recent labs were normal. -Continue routine health maintenance and monitoring.    "

## 2024-10-24 NOTE — Progress Notes (Unsigned)
" ° °  Established Patient Office Visit  Subjective   Patient ID: Tyler Wolf, male    DOB: 13-Jan-1956  Age: 69 y.o. MRN: [id number]  Chief Complaint  Patient presents with   Medical Management of Chronic Issues    Discussed the use of AI scribe software for clinical note transcription with the patient, who gave verbal consent to proceed.  History of Present Illness   ALEXANDRE FARIES is a 69 year old male with diabetes and coronary artery disease who presents for routine follow-up.  His diabetes management includes an insulin  regimen adjusted in November, oral diabetes medications, and Trulicity , which was increased at the same time. His A1c was 7.5 in November, slightly worse than in June. He uses a continuous glucose monitor and experiences occasional nocturnal hypoglycemia, requiring him to wake up. Insulin  is administered at 1 AM.  Cholesterol management is ongoing with an LDL level of 83, which is within the normal range. A CT scan from July showed multi-vessel coronary artery calcification. He is on the maximum dose of Crestor .  He smokes about eight cigarettes a day and has attempted to quit smoking in the past using nicotine  gum and other substitutes, which he found unsatisfactory.          The 10-year ASCVD risk score (Arnett DK, et al., 2019) is: 27%  Health Maintenance Due  Topic Date Due   Hepatitis C Screening  Never done   Medicare Annual Wellness (AWV)  04/02/2024   COVID-19 Vaccine (3 - 2025-26 season) 06/16/2024      Objective:     BP 110/73   Pulse 73   Ht 5' 7 (1.702 m)   Wt 164 lb 12 oz (74.7 kg)   SpO2 98%   BMI 25.80 kg/m  {Vitals History (Optional):23777}  Physical Exam            No results found for any visits on 10/24/24.      Assessment & Plan:   There are no diagnoses linked to this encounter.  Assessment and Plan    Type 2 diabetes mellitus A1c increased to 7.5%. Insulin  regimen adjusted. Occasional nocturnal hypoglycemia.  Continuous glucose monitor shows mostly in-range readings. - Continue current insulin  regimen as managed by endocrinologist. - Continue Trulicity  as prescribed. - Monitor blood glucose levels with continuous glucose monitor.  Hyperlipidemia with coronary artery disease LDL cholesterol at 83 mg/dL. On maximum Crestor  dose. Discussed Repatha or Zetia addition. Coronary artery calcium  score discussed but not necessary. - Continue Crestor  at current dose. - Consider coronary artery calcium  score if further evaluation is desired.  Hypertension Blood pressure managed with losartan  and hydrochlorothiazide . - Continue losartan  and hydrochlorothiazide  as prescribed.  Nicotine  dependence, cigarettes Smoking eight cigarettes per day. Discussed benefits of quitting and alternative methods. Open to medical-grade e-cigarettes if covered by insurance. - Attempt smoking cessation using sugar-free gum or other non-inhalation methods. - Explore insurance coverage for medical-grade e-cigarettes for smoking cessation.  General health maintenance Routine health maintenance discussed. Recent labs normal. Cholesterol levels well-managed but could be optimized. - Continue routine health maintenance and monitoring.           Return in about 6 months (around 04/23/2025) for physical.    Toribio MARLA Slain, MD  "

## 2024-10-25 NOTE — Assessment & Plan Note (Signed)
 A1c increased to 7.5%. Insulin  regimen adjusted. Occasional nocturnal hypoglycemia. Continuous glucose monitor shows mostly in-range readings. - Continue current insulin  regimen as managed by endocrinologist. - Continue Trulicity  as prescribed. - continue synjardy  - Monitor blood glucose levels with continuous glucose monitor.

## 2024-10-25 NOTE — Assessment & Plan Note (Signed)
 Blood pressure managed with losartan  and hydrochlorothiazide . - Continue losartan  and hydrochlorothiazide  as prescribed.

## 2024-10-25 NOTE — Assessment & Plan Note (Signed)
 LDL cholesterol at 83 mg/dL. On maximum Crestor  dose.  Given the findings of extensive multivessel Coronary artery calcium  on CT lung cancer screen we discussed lowering his LDL goal to < 55.   Discussed Repatha or Zetia addition. - Continue Crestor  at current dose. - pt wishes to hold off on starting repatha or zetia.

## 2024-10-25 NOTE — Assessment & Plan Note (Signed)
 Smoking eight cigarettes per day. Discussed benefits of quitting and alternative methods. Pt does not like gum/lozenge.   - Attempt smoking cessation using sugar-free gum or other non-inhalation methods. - Explore insurance coverage for nicotine  inhalation devices for smoking cessation. - advised against using vaping as a replacement

## 2024-10-30 ENCOUNTER — Encounter: Admitting: "Endocrinology

## 2024-11-04 ENCOUNTER — Ambulatory Visit: Admitting: "Endocrinology

## 2024-11-04 ENCOUNTER — Encounter: Payer: Self-pay | Admitting: "Endocrinology

## 2024-11-04 VITALS — BP 130/70 | HR 80 | Ht 67.0 in | Wt 163.0 lb

## 2024-11-04 DIAGNOSIS — Z7984 Long term (current) use of oral hypoglycemic drugs: Secondary | ICD-10-CM

## 2024-11-04 DIAGNOSIS — E7800 Pure hypercholesterolemia, unspecified: Secondary | ICD-10-CM

## 2024-11-04 DIAGNOSIS — E1165 Type 2 diabetes mellitus with hyperglycemia: Secondary | ICD-10-CM | POA: Diagnosis not present

## 2024-11-04 DIAGNOSIS — Z7985 Long-term (current) use of injectable non-insulin antidiabetic drugs: Secondary | ICD-10-CM | POA: Diagnosis not present

## 2024-11-04 DIAGNOSIS — Z794 Long term (current) use of insulin: Secondary | ICD-10-CM

## 2024-11-04 NOTE — Progress Notes (Signed)
 "   Outpatient Endocrinology Note Tyler Birmingham, MD  11/04/24   Tyler Wolf 1956/03/24 [id number]  Referring Provider: Chandra Toribio POUR, MD Primary Care Provider: Chandra Toribio POUR, MD Reason for consultation: Subjective   Assessment & Plan  Diagnoses and all orders for this visit:  Uncontrolled type 2 diabetes mellitus with hyperglycemia (HCC)  Long term (current) use of oral hypoglycemic drugs  Long-term (current) use of injectable non-insulin  antidiabetic drugs  Long-term insulin  use (HCC)  Insulin  dose changed (HCC)  Pure hypercholesterolemia   Diabetes Type II complicated by hyperglycemia Lab Results  Component Value Date   GFR 91.90 08/16/2023   Hba1c goal less than 7, current Hba1c is  Lab Results  Component Value Date   HGBA1C 7.5 (A) 08/27/2024   Will recommend the following: Humulin  70/30 insulin : 28 units before breakfast and 18 units before supper 30 min before meals  Synjardy  12.5/1000mg  bid  Trulicity  4.5 mg/week  Cut down insulin  to half if eating only half a meal or if blood sugar is between 71-100 before a meal Skip insulin  if blood sugar is less than 70 and treat with 15 gms of carbohydrates every 15 min until blood sugar is more than 100   Has DexCom   No known contraindications to any of above medications No history of MEN syndrome/medullary thyroid  cancer/pancreatitis or pancreatic cancer in self or family  -Last LD and Tg are as follows: Lab Results  Component Value Date   LDLCALC 83 10/20/2024    Lab Results  Component Value Date   TRIG 89 10/20/2024   -rosuvastatin  40 mg every day since 08/2023 -Follow low fat diet and exercise   -Blood pressure goal <140/90 - Microalbumin/creatinine goal is < 30 -Last MA/Cr is as follows: Lab Results  Component Value Date   MICROALBUR 7.0 03/27/2024   - on ACE/ARB losartan  100 mg qd -diet changes including salt restriction -limit eating outside -counseled BP targets per standards of  diabetes care -uncontrolled blood pressure can lead to retinopathy, nephropathy and cardiovascular and atherosclerotic heart disease  Reviewed and counseled on: -A1C target -Blood sugar targets -Complications of uncontrolled diabetes  -Checking blood sugar before meals and bedtime and bring log next visit -All medications with mechanism of action and side effects -Hypoglycemia management: rule of 15's, Glucagon Emergency Kit and medical alert ID -low-carb low-fat plate-method diet -At least 20 minutes of physical activity per day -Annual dilated retinal eye exam and foot exam -compliance and follow up needs -follow up as scheduled or earlier if problem gets worse  Call if blood sugar is less than 70 or consistently above 250    Take a 15 gm snack of carbohydrate at bedtime before you go to sleep if your blood sugar is less than 100.    If you are going to fast after midnight for a test or procedure, ask your physician for instructions on how to reduce/decrease your insulin  dose.    Call if blood sugar is less than 70 or consistently above 250  -Treating a low sugar by rule of 15  (15 gms of sugar every 15 min until sugar is more than 70) If you feel your sugar is low, test your sugar to be sure If your sugar is low (less than 70), then take 15 grams of a fast acting Carbohydrate (3-4 glucose tablets or glucose gel or 4 ounces of juice or regular soda) Recheck your sugar 15 min after treating low to make sure it is more than  70 If sugar is still less than 70, treat again with 15 grams of carbohydrate          Don't drive the hour of hypoglycemia  If unconscious/unable to eat or drink by mouth, use glucagon injection or nasal spray baqsimi and call 911. Can repeat again in 15 min if still unconscious.  Return in about 3 months (around 02/02/2025).   I have reviewed current medications, nurse's notes, allergies, vital signs, past medical and surgical history, family medical history,  and social history for this encounter. Counseled patient on symptoms, examination findings, lab findings, imaging results, treatment decisions and monitoring and prognosis. The patient understood the recommendations and agrees with the treatment plan. All questions regarding treatment plan were fully answered.  Tyler Birmingham, MD  11/04/24    History of Present Illness Tyler Wolf is a 69 y.o. year old male who presents for follow up of Type II diabetes mellitus.  Tyler Wolf was first diagnosed in 2009. Diabetes education +  Home diabetes regimen: Humulin  70/30 insulin : 27 units before breakfast and 17 units before supper Synjardy  12.5/1000mg  twice a day Trulicity  4.5 mg/week  -MI/stroke -retinopathy -nephropathy -neuropathy  BLOOD SUGAR DATA CGM interpretation: At today's visit, we reviewed her CGM downloads. The full report is scanned in the media. Reviewing the CGM trends, BG are mildly elevated after meals, evening>morning , and normalize overnight.  Physical Exam  BP 130/70   Pulse 80   Ht 5' 7 (1.702 m)   Wt 163 lb (73.9 kg)   SpO2 99%   BMI 25.53 kg/m    Constitutional: well developed, well nourished Head: normocephalic, atraumatic Eyes: sclera anicteric, no redness Neck: supple Lungs: normal respiratory effort Neurology: alert and oriented Skin: dry, no appreciable rashes Musculoskeletal: no appreciable defects Psychiatric: normal mood and affect Diabetic Foot Exam - Simple   No data filed      Current Medications Patient's Medications  New Prescriptions   No medications on file  Previous Medications   ACETAMINOPHEN  EXTRA STRENGTH 500 MG CAPS    Take 2 capsules by mouth every 8 (eight) hours.   ASPIRIN 81 MG TABLET    Take 81 mg by mouth daily.   CELECOXIB (CELEBREX) 100 MG CAPSULE    Take 100 mg by mouth 2 (two) times daily.   CHOLECALCIFEROL (VITAMIN D3) 25 MCG (1000 UNIT) TABLET    Take 2,000 Units by mouth daily.   CONTINUOUS GLUCOSE SENSOR  (FREESTYLE LIBRE 3 PLUS SENSOR) MISC    Inject 1 Device into the skin continuous. Change every 15 days   CYCLOBENZAPRINE  (FLEXERIL ) 10 MG TABLET    Take 1 tablet (10 mg total) by mouth 2 (two) times daily as needed for muscle spasms.   DULAGLUTIDE  (TRULICITY ) 4.5 MG/0.5ML SOAJ    Inject 4.5 mg as directed once a week.   EMPAGLIFLOZIN -METFORMIN  HCL ER (SYNJARDY  XR) 12.02-999 MG TB24    Take 1 tablet by mouth 2 (two) times daily.   FLUTICASONE  (FLONASE ) 50 MCG/ACT NASAL SPRAY    Place 2 sprays into both nostrils daily.   GLUCOSE BLOOD (CONTOUR NEXT TEST) TEST STRIP    1 each by Other route 2 (two) times daily. And lancets 2/day   INSULIN  ISOPHANE & REGULAR HUMAN KWIKPEN (HUMULIN  70/30 KWIKPEN) (70-30) 100 UNIT/ML KWIKPEN    Inject 27 Units into the skin daily before breakfast AND 17 Units daily before supper.   INSULIN  PEN NEEDLE (PEN NEEDLES) 32G X 4 MM MISC    Use  in the morning, noon, in the evening, and at bedtime.   INSULIN  PEN NEEDLE 31G X 5 MM MISC    Used to inject insulin    INSULIN  SYRINGE-NEEDLE U-100 (INSULIN  SYRINGE 1CC/30GX5/16) 30G X 5/16 1 ML MISC    1 Syringe by Does not apply route daily. Use to inject insulin  daily   KETOCONAZOLE  (NIZORAL ) 2 % SHAMPOO    Apply daily to the face and hair in the shower (avoid eyes and mouth).  After rash has resolved you can continue using it twice a week   LOSARTAN -HYDROCHLOROTHIAZIDE  (HYZAAR ) 100-12.5 MG TABLET    Take 1 tablet by mouth daily.   ONDANSETRON (ZOFRAN-ODT) 4 MG DISINTEGRATING TABLET    Take 4 mg by mouth every 8 (eight) hours as needed.   OXYCODONE  (OXY IR/ROXICODONE ) 5 MG IMMEDIATE RELEASE TABLET    Take 5-10 mg by mouth every 6 (six) hours as needed.   ROSUVASTATIN  (CRESTOR ) 40 MG TABLET    Take 1 tablet (40 mg total) by mouth daily.  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  Social History Social History   Socioeconomic History   Marital status: Married    Spouse name: Not on file   Number of children: Not on file   Years of education: Not on file   Highest education level: Not on file  Occupational History   Not on file  Tobacco Use   Smoking status: Every Day    Current packs/day: 0.55    Average packs/day: 0.6 packs/day for 51.0 years (28.1 ttl pk-yrs)    Types: Cigarettes    Passive exposure: Never   Smokeless tobacco: Never   Tobacco comments:    electronic cigarettes, 6/1/2020pt states on and off  Vaping Use   Vaping status: Never Used  Substance and Sexual Activity   Alcohol use: No    Alcohol/week: 0.0 standard drinks of alcohol   Drug use: No   Sexual activity: Not Currently    Birth control/protection: None  Other Topics Concern   Not on file  Social History Narrative   Not on file   Social Drivers of Health   Tobacco Use: High Risk (11/04/2024)   Patient History    Smoking Tobacco Use: Every Day    Smokeless Tobacco Use: Never    Passive Exposure: Never  Financial Resource Strain: Low Risk (04/03/2023)   Overall Financial Resource Strain (CARDIA)    Difficulty of Paying Living Expenses: Not hard at all  Food Insecurity: No Food Insecurity (04/03/2023)   Hunger Vital Sign    Worried About Running Out of Food in the Last Year: Never true    Ran Out of Food in the Last Year: Never true  Transportation Needs: No Transportation Needs (04/03/2023)   PRAPARE - Transportation     Lack of Transportation (Medical): No    Lack  of Transportation (Non-Medical): No  Physical Activity: Sufficiently Active (04/03/2023)   Exercise Vital Sign    Days of Exercise per Week: 2 days    Minutes of Exercise per Session: 110 min  Stress: No Stress Concern Present (04/03/2023)   [employer] of Occupational Health - Occupational Stress Questionnaire    Feeling of Stress : Not at all  Social Connections: Moderately Isolated (04/03/2023)   Social Connection and Isolation Panel    Frequency of Communication with Friends and Family: Once a week    Frequency of Social Gatherings with Friends and Family: Twice a week    Attends Religious Services: Never    Database Administrator or Organizations: No    Attends Banker Meetings: Never    Marital Status: Married  Catering Manager Violence: Not At Risk (04/03/2023)   Humiliation, Afraid, Rape, and Kick questionnaire    Fear of Current or Ex-Partner: No    Emotionally Abused: No    Physically Abused: No    Sexually Abused: No  Depression (PHQ2-9): Low Risk (10/24/2024)   Depression (PHQ2-9)    PHQ-2 Score: 0  Alcohol Screen: Low Risk (04/03/2023)   Alcohol Screen    Last Alcohol Screening Score (AUDIT): 0  Housing: Low Risk (04/03/2023)   Housing    Last Housing Risk Score: 0  Utilities: Not At Risk (04/03/2023)   AHC Utilities    Threatened with loss of utilities: No  Health Literacy: Not on file    Lab Results  Component Value Date   HGBA1C 7.5 (A) 08/27/2024   HGBA1C 6.5 (A) 04/01/2024   HGBA1C 7.0 (A) 01/07/2024   Lab Results  Component Value Date   CHOL 145 10/20/2024   Lab Results  Component Value Date   HDL 45 10/20/2024   Lab Results  Component Value Date   LDLCALC 83 10/20/2024   Lab Results  Component Value Date   TRIG 89 10/20/2024   Lab Results  Component Value Date   CHOLHDL 3.2 10/20/2024   Lab Results  Component Value Date   CREATININE 0.79 10/20/2024   Lab Results   Component Value Date   GFR 91.90 08/16/2023   Lab Results  Component Value Date   MICROALBUR 7.0 03/27/2024      Component Value Date/Time   NA 142 10/20/2024 0907   K 4.9 10/20/2024 0907   CL 105 10/20/2024 0907   CO2 21 10/20/2024 0907   GLUCOSE 110 (H) 10/20/2024 0907   GLUCOSE 172 (H) 08/16/2023 1218   GLUCOSE 94 09/10/2006 1325   BUN 21 10/20/2024 0907   CREATININE 0.79 10/20/2024 0907   CALCIUM  9.4 10/20/2024 0907   PROT 6.9 10/20/2024 0907   ALBUMIN 4.7 10/20/2024 0907   AST 25 10/20/2024 0907   ALT 30 10/20/2024 0907   ALKPHOS 63 10/20/2024 0907   BILITOT 0.4 10/20/2024 0907   GFRNONAA 93 02/12/2020 0939   GFRAA 107 02/12/2020 0939      Latest Ref Rng & Units 10/20/2024    9:07 AM 01/23/2024   10:06 AM 08/16/2023   12:18 PM  BMP  Glucose 70 - 99 mg/dL 889  89  827   BUN 8 - 27 mg/dL 21  21  17    Creatinine 0.76 - 1.27 mg/dL 9.20  9.21  9.17   BUN/Creat Ratio 10 - 24 27  27     Sodium 134 - 144 mmol/L 142  143  141   Potassium 3.5 - 5.2 mmol/L 4.9  4.9  4.3   Chloride 96 - 106 mmol/L 105  103  104   CO2 20 - 29 mmol/L 21  24  28    Calcium  8.6 - 10.2 mg/dL 9.4  9.7  9.5        Component Value Date/Time   WBC 9.2 02/26/2023 0935   WBC 6.8 07/01/2018 0906   RBC 5.32 02/26/2023 0935   RBC 4.99 07/01/2018 0906   HGB 16.4 02/26/2023 0935   HCT 47.4 02/26/2023 0935   PLT 256 02/26/2023 0935   MCV 89 02/26/2023 0935   MCH 30.8 02/26/2023 0935   MCHC 34.6 02/26/2023 0935   MCHC 34.7 07/01/2018 0906   RDW 12.9 02/26/2023 0935   LYMPHSABS 1.4 02/26/2023 0935   MONOABS 0.6 07/01/2018 0906   EOSABS 0.2 02/26/2023 0935   BASOSABS 0.0 02/26/2023 0935     Parts of this note may have been dictated using voice recognition software. There may be variances in spelling and vocabulary which are unintentional. Not all errors are proofread. Please notify the dino if any discrepancies are noted or if the meaning of any statement is not clear.   "

## 2024-11-04 NOTE — Patient Instructions (Addendum)
 Will recommend the following: Humulin  70/30 insulin : 27-28 units before breakfast and 17-18 units before supper 30 min before meals  Synjardy  12.5/1000mg  bid  Trulicity  4.5 mg/week  Cut down insulin  to half if eating only half a meal or if blood sugar is between 71-100 before a meal Skip insulin  if blood sugar is less than 70 and treat with 15 gms of carbohydrates every 15 min until blood sugar is more than 100   _______   Goals of DM therapy:  Morning Fasting blood sugar: 80-140  Blood sugar before meals: 80-140 Bed time blood sugar: 100-150  A1C <7%, limited only by hypoglycemia  1.Diabetes medications and their side effects discussed, including hypoglycemia    2. Check blood glucose:  a) Always check blood sugars before driving. Please see below (under hypoglycemia) on how to manage b) Check a minimum of 3 times/day or more as needed when having symptoms of hypoglycemia.   c) Try to check blood glucose before sleeping/in the middle of the night to ensure that it is remaining stable and not dropping less than 100 d) Check blood glucose more often if sick  3. Diet: a) 3 meals per day schedule b: Restrict carbs to 60-70 grams (4 servings) per meal c) Colorful vegetables - 3 servings a day, and low sugar fruit 2 servings/day Plate control method: 1/4 plate protein, 1/4 starch, 1/2 green, yellow, or red vegetables d) Avoid carbohydrate snacks unless hypoglycemic episode, or increased physical activity  4. Regular exercise as tolerated, preferably 3 or more hours a week  5. Hypoglycemia: a)  Do not drive or operate machinery without first testing blood glucose to assure it is over 90 mg%, or if dizzy, lightheaded, not feeling normal, etc, or  if foot or leg is numb or weak. b)  If blood glucose less than 70, take four 5gm Glucose tabs or 15-30 gm Glucose gel.  Repeat every 15 min as needed until blood sugar is >100 mg/dl. If hypoglycemia persists then call 911.   6. Sick day  management: a) Check blood glucose more often b) Continue usual therapy if blood sugars are elevated.   7. Contact the doctor immediately if blood glucose is frequently <60 mg/dl, or an episode of severe hypoglycemia occurs (where someone had to give you glucose/  glucagon or if you passed out from a low blood glucose), or if blood glucose is persistently >350 mg/dl, for further management  8. A change in level of physical activity or exercise and a change in diet may also affect your blood sugar. Check blood sugars more often and call if needed.  Instructions: 1. Bring glucose meter, blood glucose records on every visit for review 2. Continue to follow up with primary care physician and other providers for medical care 3. Yearly eye  and foot exam 4. Please get blood work done prior to the next appointment

## 2024-11-12 ENCOUNTER — Other Ambulatory Visit: Payer: Self-pay

## 2024-11-13 ENCOUNTER — Other Ambulatory Visit: Payer: Self-pay

## 2024-11-20 NOTE — Progress Notes (Signed)
 Pt was not seen, however scan in document.

## 2025-02-04 ENCOUNTER — Ambulatory Visit: Admitting: "Endocrinology

## 2025-04-16 ENCOUNTER — Other Ambulatory Visit

## 2025-04-24 ENCOUNTER — Encounter: Admitting: Family Medicine
